# Patient Record
Sex: Female | Born: 1935 | Race: White | Hispanic: No | Smoking: Former smoker
Health system: Southern US, Community
[De-identification: ages and names within clinical notes are randomized; demographics above are authoritative.]

## PROBLEM LIST (undated history)

## (undated) DIAGNOSIS — H409 Unspecified glaucoma: Secondary | ICD-10-CM

## (undated) DIAGNOSIS — E785 Hyperlipidemia, unspecified: Secondary | ICD-10-CM

## (undated) DIAGNOSIS — E119 Type 2 diabetes mellitus without complications: Secondary | ICD-10-CM

## (undated) DIAGNOSIS — I10 Essential (primary) hypertension: Secondary | ICD-10-CM

## (undated) DIAGNOSIS — E039 Hypothyroidism, unspecified: Secondary | ICD-10-CM

## (undated) DIAGNOSIS — D649 Anemia, unspecified: Secondary | ICD-10-CM

## (undated) DIAGNOSIS — R739 Hyperglycemia, unspecified: Secondary | ICD-10-CM

## (undated) DIAGNOSIS — G629 Polyneuropathy, unspecified: Secondary | ICD-10-CM

## (undated) HISTORY — PX: BREAST SURGERY: SHX581

## (undated) HISTORY — DX: Hyperlipidemia, unspecified: E78.5

## (undated) HISTORY — DX: Polyneuropathy, unspecified: G62.9

## (undated) HISTORY — DX: Unspecified glaucoma: H40.9

## (undated) HISTORY — DX: Type 2 diabetes mellitus without complications: E11.9

## (undated) HISTORY — PX: CHOLECYSTECTOMY: SHX55

## (undated) HISTORY — PX: THYROIDECTOMY: SHX17

## (undated) HISTORY — DX: Anemia, unspecified: D64.9

---

## 1998-03-30 ENCOUNTER — Other Ambulatory Visit: Admission: RE | Admit: 1998-03-30 | Discharge: 1998-03-30 | Payer: Self-pay | Admitting: Gastroenterology

## 1999-08-02 ENCOUNTER — Other Ambulatory Visit: Admission: RE | Admit: 1999-08-02 | Discharge: 1999-08-02 | Payer: Self-pay | Admitting: Family Medicine

## 1999-11-08 ENCOUNTER — Encounter: Admission: RE | Admit: 1999-11-08 | Discharge: 1999-11-08 | Payer: Self-pay | Admitting: Surgery

## 1999-11-08 ENCOUNTER — Encounter: Payer: Self-pay | Admitting: Surgery

## 2000-08-10 ENCOUNTER — Other Ambulatory Visit: Admission: RE | Admit: 2000-08-10 | Discharge: 2000-08-10 | Payer: Self-pay | Admitting: Family Medicine

## 2000-10-05 ENCOUNTER — Encounter: Payer: Self-pay | Admitting: Family Medicine

## 2000-10-05 ENCOUNTER — Encounter: Admission: RE | Admit: 2000-10-05 | Discharge: 2000-10-05 | Payer: Self-pay | Admitting: Family Medicine

## 2000-10-08 ENCOUNTER — Encounter: Admission: RE | Admit: 2000-10-08 | Discharge: 2000-10-08 | Payer: Self-pay | Admitting: Family Medicine

## 2000-10-08 ENCOUNTER — Encounter: Payer: Self-pay | Admitting: Family Medicine

## 2000-11-10 ENCOUNTER — Encounter: Admission: RE | Admit: 2000-11-10 | Discharge: 2000-11-10 | Payer: Self-pay | Admitting: Surgery

## 2000-11-10 ENCOUNTER — Encounter: Payer: Self-pay | Admitting: Surgery

## 2001-08-19 ENCOUNTER — Other Ambulatory Visit: Admission: RE | Admit: 2001-08-19 | Discharge: 2001-08-19 | Payer: Self-pay | Admitting: Family Medicine

## 2001-08-25 ENCOUNTER — Encounter: Admission: RE | Admit: 2001-08-25 | Discharge: 2001-08-25 | Payer: Self-pay | Admitting: Family Medicine

## 2001-08-25 ENCOUNTER — Encounter: Payer: Self-pay | Admitting: Family Medicine

## 2001-11-15 ENCOUNTER — Encounter: Admission: RE | Admit: 2001-11-15 | Discharge: 2001-11-15 | Payer: Self-pay | Admitting: Family Medicine

## 2001-11-15 ENCOUNTER — Encounter: Payer: Self-pay | Admitting: Family Medicine

## 2001-11-22 ENCOUNTER — Encounter (INDEPENDENT_AMBULATORY_CARE_PROVIDER_SITE_OTHER): Payer: Self-pay | Admitting: Specialist

## 2001-11-22 ENCOUNTER — Ambulatory Visit (HOSPITAL_COMMUNITY): Admission: RE | Admit: 2001-11-22 | Discharge: 2001-11-22 | Payer: Self-pay | Admitting: Gastroenterology

## 2002-08-29 ENCOUNTER — Other Ambulatory Visit: Admission: RE | Admit: 2002-08-29 | Discharge: 2002-08-29 | Payer: Self-pay | Admitting: Family Medicine

## 2002-11-16 ENCOUNTER — Encounter: Admission: RE | Admit: 2002-11-16 | Discharge: 2002-11-16 | Payer: Self-pay | Admitting: Family Medicine

## 2002-11-16 ENCOUNTER — Encounter: Payer: Self-pay | Admitting: Family Medicine

## 2003-09-27 ENCOUNTER — Other Ambulatory Visit: Admission: RE | Admit: 2003-09-27 | Discharge: 2003-09-27 | Payer: Self-pay | Admitting: Family Medicine

## 2003-11-22 ENCOUNTER — Encounter: Admission: RE | Admit: 2003-11-22 | Discharge: 2003-11-22 | Payer: Self-pay | Admitting: Surgery

## 2004-12-04 ENCOUNTER — Encounter: Admission: RE | Admit: 2004-12-04 | Discharge: 2004-12-04 | Payer: Self-pay | Admitting: Surgery

## 2005-12-10 ENCOUNTER — Encounter: Admission: RE | Admit: 2005-12-10 | Discharge: 2005-12-10 | Payer: Self-pay | Admitting: Surgery

## 2006-05-01 ENCOUNTER — Encounter: Admission: RE | Admit: 2006-05-01 | Discharge: 2006-05-01 | Payer: Self-pay | Admitting: Internal Medicine

## 2006-05-15 ENCOUNTER — Inpatient Hospital Stay (HOSPITAL_COMMUNITY): Admission: EM | Admit: 2006-05-15 | Discharge: 2006-05-20 | Payer: Self-pay | Admitting: Emergency Medicine

## 2006-05-15 ENCOUNTER — Encounter (INDEPENDENT_AMBULATORY_CARE_PROVIDER_SITE_OTHER): Payer: Self-pay | Admitting: *Deleted

## 2006-05-18 ENCOUNTER — Ambulatory Visit: Payer: Self-pay | Admitting: Physical Medicine & Rehabilitation

## 2006-12-11 ENCOUNTER — Encounter: Admission: RE | Admit: 2006-12-11 | Discharge: 2006-12-11 | Payer: Self-pay | Admitting: Surgery

## 2007-12-14 ENCOUNTER — Encounter: Admission: RE | Admit: 2007-12-14 | Discharge: 2007-12-14 | Payer: Self-pay | Admitting: Internal Medicine

## 2008-12-14 ENCOUNTER — Encounter: Admission: RE | Admit: 2008-12-14 | Discharge: 2008-12-14 | Payer: Self-pay | Admitting: Surgery

## 2008-12-14 LAB — HM MAMMOGRAPHY: HM Mammogram: NORMAL

## 2011-05-02 NOTE — Op Note (Signed)
NAME:  Melissa Mccullough, Melissa Mccullough NO.:  [id number]   MEDICAL RECORD NO.:  [id number]          PATIENT TYPE:  INP   LOCATION:  2550                         FACILITY:  [facility]   PHYSICIAN:  Feliberto Gottron. Turner Daniels, M.D.   DATE OF BIRTH:  08-Sep-1928   DATE OF PROCEDURE:  05/15/2006  DATE OF DISCHARGE:                                 OPERATIVE REPORT   PREOPERATIVE DIAGNOSIS:  Left midshaft femur fracture.   POSTOPERATIVE DIAGNOSIS:  Left midshaft femur fracture.   PROCEDURE:  Open reduction internal fixation, left femur fracture with a  DePuy long trochanteric nail, crossed proximal locking screws and one distal  locking screw.   SURGEON:  Feliberto Gottron. Turner Daniels, MD   ASSISTANT:  Skip Mayer, PA-C.   The implant itself was actually 13 mm x 38 cm nail.   ESTIMATED BLOOD LOSS:  300 mL.   FLUID REPLACEMENT:  1L crystalloid.   DRAINS PLACED:  None.   TOURNIQUET TIME:  None.   INDICATIONS FOR PROCEDURE:  77 year old woman with severe osteoporosis who  had just finished up a course of IV Forteo because of her severe  osteoporosis.  Over the last week she has had increasing left thigh pain and  her left femur snapped on her today at the junction of the middle and distal  thirds.  She is taken to the [facility] emergency room.  Radiographs obtained and  orthopedic consultation arranged.  We reviewed the fracture.  She had  distant history of breast cancer.  No obvious lesions in the femur; however,  the plan was to biopsy the reamings just in case.  Risks, benefits of  surgery discussed and she is prepared for surgical stabilization of the  femur.   DESCRIPTION OF PROCEDURE:  The patient identified by armband, taken the  operating room at [facility].  Appropriate anesthetic monitors were  attached and general endotracheal anesthesia induced with the patient in  supine position.  She was then transferred to the flat [place] radiolucent  table, placed in a the right lateral decubitus  position and fixed there was  a [employer] II pelvic clamp and the left lower extremity prepped, draped  in usual sterile fashion from the ankle to the hemi pelvis.  Under C-arm  control we then found a point on the buttock, that was coaxial with the  trochanter and the shaft of the femur and a guide pin was placed through the  greater trochanter and down the center of the femur and then over reamed  with a proximal reamer for the DePuy trochanteric nail set.  We then placed  a beaded guide wire down to the distal femur just above the knee joint and  then sequentially reamed up to a 14.5 cannulated reamer obtaining a little  bit of chatter at the fracture site.  Her femur was quite large.  We  measured for a 38 cm nail which was then loaded up on the driver and was  driven through the greater trochanter down the shaft of the femur through  the fracture site and down to the distal aspect of  the femur about 1 cm or  1.5 cm above Blumensaat's line.  The spiral fracture itself was nicely  reduced near anatomic.  Satisfied with position of the rod and with the  driving platform in place we then placed a locking screw up through the  trochanter and into the femoral neck and another one down to the trochanter  into the lesser trochanter, obtaining good proximal locking of the proximal  third fracture.  Distally because of the anatomic reduction of the  noncomminuted fracture a single locking screw was used, placed under  freehand control again through about a 1 cm stab wound.  All wounds were  irrigated out with normal saline solution.  Subcutaneous closure  accomplished with running 2-0 Vicryl suture and the skin with skin staples.  Dressing of Xeroform, 4x4, dressing sponges, Hypafix tape applied.  The  patient was unclamped, rolled supine, awakened and taken to the recovery  room without difficulty.      Feliberto Gottron. Turner Daniels, M.D.  Electronically Signed     FJR/MEDQ  D:  05/15/2006  T:   05/18/2006  Job:  [id number]

## 2011-05-02 NOTE — Procedures (Signed)
. [facility]  Patient:    Melissa Mccullough, Melissa Mccullough Visit Number: [id number] MRN: [id number]          Service Type: END Location: ENDO Attending Physician:  Rich Brave Dictated by:   Florencia Reasons, M.D. Proc. Date: 11/22/01 Admit Date:  11/22/2001   CC:         Talmadge Coventry, M.D.   Procedure Report  PROCEDURE PERFORMED:  Upper endoscopy.  ENDOSCOPIST:  Florencia Reasons, M.D.  INDICATIONS FOR PROCEDURE:  The patient is a 77 year old female with transiently hemoccult positive stool who was maintained on a daily aspirin (adult strength).  FINDINGS:  Slight gastric erythema consistent with aspirin induced gastric irritation.  DESCRIPTION OF PROCEDURE:  The patient provided written consent for the procedure.  Sedation was fentanyl 40 mcg and Versed 4 mg prior to and during this procedure and the colonoscopy which followed it.  The Olympus video endoscope was passed under direct vision without difficulty.  The esophagus was endoscopically normal.  No reflux esophagitis, Barretts esophagus, varices, infection or neoplasia were appreciated and there was no evidence of any ring stricture or hiatal hernia.  The stomach contained no significant residual.  There was patchy antral erythema and some focal mucosal hemorrhages in the antral region consistent with aspirin induced gastropathy, but no lesions such as erosions, ulcers, polyps or masses were observed including retroflex view of the proximal stomach.  The pylorus duodenal bulb and second duodenum were normal.  The scope was removed from the patient  who tolerated the procedure well and there were no apparent complications.  No biopsies were obtained.  IMPRESSION:  Mild aspirin induced gastritis which might account for the patients transient hemoccult positivity.  PLAN:  Based on the endoscopic findings, I do not see any indication for stopping her aspirin.  Proceed to  colonoscopic evaluation.  I do not see where there is any mandatory need for ongoing antipeptic therapy, either.  Note that the patient is on Vioxx as of the present time, which might be contributing to the endoscopic picture. Dictated by:   Florencia Reasons, M.D. Attending Physician:  Rich Brave DD:  11/22/01 TD:  11/22/01 Job: [id number]

## 2011-05-02 NOTE — Discharge Summary (Signed)
Melissa Mccullough, BIGGS NO.:  [id number]   MEDICAL RECORD NO.:  [id number]          PATIENT TYPE:  INP   LOCATION:  5025                         FACILITY:  [facility]   PHYSICIAN:  Feliberto Gottron. Turner Daniels, M.D.   DATE OF BIRTH:  September 27, 1928   DATE OF ADMISSION:  05/15/2006  DATE OF DISCHARGE:  05/20/2006                                 DISCHARGE SUMMARY   PRIMARY DISCHARGE DIAGNOSIS:  Left femoral fracture.   PROCEDURE IN HOSPITAL:  ORIF of left femoral fracture with an IM rod,  reconstructive type.   HISTORY:  The patient is a 77 year old woman with severe osteoporosis, who  had just finished up a course of IV Fosamax 40 mg, because of severe  osteoporosis of the left leg, with which she has had increasing left thigh  pain.  Her low femur snapped on the day of admission at the junction of the  middle and distal thirds, and she fell because of that.  She has been taken  to the [facility] Emergency Department.  Radiographs were  obtained and an orthopedic consultation arranged with Dr. Feliberto Gottron. Turner Daniels.  A  distant history of breast cancer.  No obvious lesions of the femur.  The  risks and benefits of the surgery were discussed, and the patient wished to  proceed.   PAST MEDICAL HISTORY:  1.  Usual childhood disease.  2.  Diabetes.  3.  Thyroid disease.  4.  Breast cancer.  5.  Hypertension.  6.  Osteoporosis.  7.  Increased cholesterol.  8.  Glaucoma.   PAST SURGICAL HISTORY:  1.  Thyroid removal.  2.  Breast cancer resection.   FAMILY HISTORY:  Mother died at age 61, secondary to cancer.  Father died at  age 12, secondary to cancer.   SOCIAL HISTORY:  No tobacco, ethanol or drug abuse.  She is a Neurosurgeon.  She is widowed.   ALLERGIES:  CODEINE which causes a rash.   MEDICATIONS ON ADMISSION:  1.  Fosamax 40 mg.  2.  Xalatan.  3.  Synthroid.  4.  Actos.  5.  Glipizide.  6.  Norvasc.  7.  Lisinopril.  8.  Hydrochlorothiazide.   REVIEW OF  SYSTEMS:  Positive for upper and lower dentures.  Glasses.  No  recent illness, shortness of breath or chest pain.   PHYSICAL EXAMINATION:  VITAL SIGNS:  Afebrile, pulse 92, respirations 18,  blood pressure 142/76.  She is a 5-foot, 150-pound female.  NECK:  Supple with a full range of motion.  CHEST:  Clear to auscultation and percussion.  HEENT:  Head normocephalic and atraumatic.  Eyes:  Pupils equal, round,  reactive to light and accommodation.  Ears:  Tympanic membranes clear.  HEART:  A regular rate and rhythm.  ABDOMEN:  Soft, nontender.  EXTREMITIES:  Left leg in a splint.  NEUROLOGIC:  Intact to light touch and pulses with the exception of  decreased light touch on the plantar surfaces of both feet which is  secondary to some problems from her diabetes.   PREOPERATIVE LABORATORY DATA:  Including a  CBC, CMET, chest x-ray,  electrocardiogram and PTT were within normal limits.   HOSPITAL COURSE:  The patient was taken to the [facility]  Operating Room where she underwent an open reduction, internal fixation of  her left femoral fracture using a DePuy long trochanteric nail with crossed  proximal locking screws and one distal locking screw.  The patient was  placed on peri-operative antibiotics. She was placed on postoperative  Coumadin prophylaxis with target level of 1.5 to 2.  She was placed on  postoperative pain control using a PCA.  She was also placed on  postoperative glycemic control per the hospital protocol.   On postoperative day one the patient was resting fairly comfortably.  She  had a moderate amount of pain.  She was awake, alert and otherwise taking  p.o. well.  She was afebrile.  She was neurovascularly intact to motor.  Light touch was at her baseline.  PT 14.2.  Hemoglobin 10.1, otherwise  stable.  She began physical therapy with partial weightbearing with physical  therapy's help.  Of note, she was also bridged with Lovenox until she  became  therapeutic on the Coumadin.   On postoperative day two the patient was without complaints, afebrile.  Hemoglobin 8.6.  INR 1.1.  The thigh remained soft.  Physical therapy  suggested getting a rehab consultation.  This was obtained.  The PCA was  discontinued.   On postoperative day three the patient was without complaints, afebrile.  INR 1.3.  Dressings were dry.  The wound was benign.  A rehab consultation  had been obtained and they had been suggesting inpatient rehab.   On postoperative day four the patient was without complaints.  T-Max 100.5  degrees, ranging to 97.8 degrees.  INR 1.7.  The wound was benign.  The  staples were intact.  She continued with physical therapy.  Unfortunately  she did not seem to be making a particularly quick progress, but was  progressing steadily; however, a bed was not available on rehab.  Her  insurance denied her rehab stay anyway.   On postoperative day five the patient was without complaints.  T-Max was  98.8 degrees.  INR 1.9.  The dressing was dry.  The wound was benign.  The  thigh was soft and nontender.  She was medically stable and improved  compared to prior to her admission.  She was orthopedically improved as  well.  An examination of the patient, she was progressing well with physical  therapy, partial weightbearing to the left lower extremity.  Dressing  changes daily.  She may shower on postoperative day seven.   DISCHARGE MEDICATIONS:  1.  Ferrous sulfate 325 mg t.i.d.  2.  Synthroid 50 mcg daily.  3.  Actos 30 mg daily with meals.  4.  Lisinopril 10 mg p.o. daily.  5.  Glucotrol 5 mg p.o. daily a.c.  6.  Norvasc 5 mg p.o. daily.  7.  Zetia 10 mg p.o. daily.  8.  Hydrochlorothiazide 25 mg p.o. daily.  9.  Xalatan ophthalmic solution, one drop in each eye at night time.  10. Coumadin per the pharmacy protocol with a target INR of 1.5 to 2.  11. Senokot p.r.n. constipation. 12. Tylox, one to two q.4h. p.r.n. pain.   13. Phenergan 25 mg p.o., IV or suppository, the best available route p.r.n.      nausea.  14. Robaxin 500 mg p.o. q.6h. p.r.n. spasm.  15. Xanax 0.25 mg p.o. p.r.n.  agitation or anxiety.   ACTIVITY:  As mentioned, partial weightbearing with physical therapy's help  and a walker.   FOLLOWUP:  She should return to the clinic to see Dr. Turner Daniels in approximately  one week's time.  Please call [phone] to schedule that appointment.  She  should return sooner should she have any increased temperature over 101  degrees and any increase in pain or drainage from the wound that looks like  infection.  She will otherwise return to see [place] as scheduled.      Laural Benes. Jannet Mantis.      Feliberto Gottron. Turner Daniels, M.D.  Electronically Signed    JBR/MEDQ  D:  05/20/2006  T:  05/20/2006  Job:  [id number]

## 2011-05-02 NOTE — Procedures (Signed)
[place]  Patient:    Melissa Mccullough, Melissa Mccullough Visit Number: [id number] MRN: [id number]          Service Type: END Location: ENDO Attending Physician:  Rich Brave Dictated by:   Florencia Reasons, M.D. Proc. Date: 11/22/01 Admit Date:  11/22/2001   CC:         Talmadge Coventry, M.D.   Procedure Report  PROCEDURE:  Colonoscopy with biopsy.  INDICATION:  A 77 year old female with remote history of adenomatous polyp having been removed and her most recent surveillance colonoscopy, four years ago, having been negative for neoplasia but she was found to be transiently Hemoccult-positive on Hemoccults through her primary physicians office recently.  FINDINGS:  Diminutive sessile polyp, removed by cold biopsy technique.  DESCRIPTION OF PROCEDURE:  The nature, purpose, and risks of the procedure were familiar to the patient from prior examination, and she provided written consent.  This procedure was performed immediately following her upper endoscopy.  Total sedation for the two procedures was fentanyl 40 mcg and Versed 4 mg IV without arrhythmias or desaturation.  The Olympus standard adult video colonoscope was advanced to the terminal ileum without too much difficulty, applying just a little bit of external abdominal compression to facilitate passage.  Pullback was then performed.  The terminal ileum was normal.  In the colon, there was a 2-3 mm sessile polyp in the midtransverse colon region at about 100 cm of insertion during advancement of the scope.  This was removed by two cold biopsies.  No other polyps were seen, and there was no evidence of cancer, colitis, vascular malformations, or diverticular disease.  The patient tolerated the procedure well, and there were no apparent complications.  Retroflexion in the rectum was unremarkable.  IMPRESSION:  Diminutive colonic polyp, otherwise normal exam.  PLAN:  Await pathology  on the polyp.  Follow-up colonoscopy anticipated in five years in view of the prior history of colonic adenomas, presumably if the patient remains in good general health in the interim. Dictated by:   Florencia Reasons, M.D. Attending Physician:  Rich Brave DD:  11/22/01 TD:  11/22/01 Job: [id number]

## 2012-05-19 ENCOUNTER — Emergency Department (HOSPITAL_COMMUNITY): Payer: Medicare Other

## 2012-05-19 ENCOUNTER — Inpatient Hospital Stay (HOSPITAL_COMMUNITY)
Admission: EM | Admit: 2012-05-19 | Discharge: 2012-05-22 | DRG: 388 | Disposition: A | Discharge status: Home Health | Payer: Medicare Other | Attending: Internal Medicine | Admitting: Internal Medicine

## 2012-05-19 ENCOUNTER — Encounter (HOSPITAL_COMMUNITY): Payer: Self-pay

## 2012-05-19 DIAGNOSIS — Z7982 Long term (current) use of aspirin: Secondary | ICD-10-CM

## 2012-05-19 DIAGNOSIS — Z79899 Other long term (current) drug therapy: Secondary | ICD-10-CM

## 2012-05-19 DIAGNOSIS — E1165 Type 2 diabetes mellitus with hyperglycemia: Secondary | ICD-10-CM

## 2012-05-19 DIAGNOSIS — E119 Type 2 diabetes mellitus without complications: Secondary | ICD-10-CM | POA: Diagnosis present

## 2012-05-19 DIAGNOSIS — Z87891 Personal history of nicotine dependence: Secondary | ICD-10-CM

## 2012-05-19 DIAGNOSIS — D72829 Elevated white blood cell count, unspecified: Secondary | ICD-10-CM | POA: Diagnosis present

## 2012-05-19 DIAGNOSIS — R109 Unspecified abdominal pain: Secondary | ICD-10-CM

## 2012-05-19 DIAGNOSIS — R6510 Systemic inflammatory response syndrome (SIRS) of non-infectious origin without acute organ dysfunction: Secondary | ICD-10-CM

## 2012-05-19 DIAGNOSIS — J189 Pneumonia, unspecified organism: Secondary | ICD-10-CM | POA: Diagnosis present

## 2012-05-19 DIAGNOSIS — R1084 Generalized abdominal pain: Secondary | ICD-10-CM

## 2012-05-19 DIAGNOSIS — E876 Hypokalemia: Secondary | ICD-10-CM | POA: Diagnosis present

## 2012-05-19 DIAGNOSIS — T495X5A Adverse effect of ophthalmological drugs and preparations, initial encounter: Secondary | ICD-10-CM | POA: Diagnosis not present

## 2012-05-19 DIAGNOSIS — K560 Paralytic ileus: Principal | ICD-10-CM | POA: Diagnosis present

## 2012-05-19 DIAGNOSIS — E039 Hypothyroidism, unspecified: Secondary | ICD-10-CM | POA: Diagnosis present

## 2012-05-19 DIAGNOSIS — N390 Urinary tract infection, site not specified: Secondary | ICD-10-CM | POA: Diagnosis present

## 2012-05-19 DIAGNOSIS — H5789 Other specified disorders of eye and adnexa: Secondary | ICD-10-CM | POA: Diagnosis not present

## 2012-05-19 DIAGNOSIS — I10 Essential (primary) hypertension: Secondary | ICD-10-CM | POA: Diagnosis present

## 2012-05-19 DIAGNOSIS — H3530 Unspecified macular degeneration: Secondary | ICD-10-CM | POA: Diagnosis present

## 2012-05-19 DIAGNOSIS — H409 Unspecified glaucoma: Secondary | ICD-10-CM | POA: Diagnosis present

## 2012-05-19 DIAGNOSIS — E785 Hyperlipidemia, unspecified: Secondary | ICD-10-CM | POA: Diagnosis present

## 2012-05-19 DIAGNOSIS — D696 Thrombocytopenia, unspecified: Secondary | ICD-10-CM

## 2012-05-19 DIAGNOSIS — Z853 Personal history of malignant neoplasm of breast: Secondary | ICD-10-CM

## 2012-05-19 DIAGNOSIS — A498 Other bacterial infections of unspecified site: Secondary | ICD-10-CM | POA: Diagnosis present

## 2012-05-19 DIAGNOSIS — Z66 Do not resuscitate: Secondary | ICD-10-CM | POA: Diagnosis present

## 2012-05-19 DIAGNOSIS — B309 Viral conjunctivitis, unspecified: Secondary | ICD-10-CM | POA: Diagnosis not present

## 2012-05-19 HISTORY — DX: Essential (primary) hypertension: I10

## 2012-05-19 HISTORY — DX: Hypothyroidism, unspecified: E03.9

## 2012-05-19 LAB — CBC
Hemoglobin: 12.1 g/dL (ref 12.0–15.0)
MCH: 25.6 pg — ABNORMAL LOW (ref 26.0–34.0)
MCHC: 33 g/dL (ref 30.0–36.0)
MCV: 77.8 fL — ABNORMAL LOW (ref 78.0–100.0)
Platelets: 295 10*3/uL (ref 150–400)
Platelets: 237 10*3/uL (ref 150–400)
RBC: 4.72 MIL/uL (ref 3.87–5.11)
RBC: 4.24 MIL/uL (ref 3.87–5.11)
RDW: 16.3 % — ABNORMAL HIGH (ref 11.5–15.5)
RDW: 16.4 % — ABNORMAL HIGH (ref 11.5–15.5)
WBC: 16.7 10*3/uL — ABNORMAL HIGH (ref 4.0–10.5)
WBC: 15.6 10*3/uL — ABNORMAL HIGH (ref 4.0–10.5)

## 2012-05-19 LAB — DIFFERENTIAL
Basophils Absolute: 0 10*3/uL (ref 0.0–0.1)
Basophils Relative: 0 % (ref 0–1)
Eosinophils Absolute: 0 10*3/uL (ref 0.0–0.7)
Eosinophils Relative: 0 % (ref 0–5)
Lymphocytes Relative: 8 % — ABNORMAL LOW (ref 12–46)
Monocytes Absolute: 1.3 10*3/uL — ABNORMAL HIGH (ref 0.1–1.0)
Monocytes Relative: 8 % (ref 3–12)

## 2012-05-19 LAB — PROCALCITONIN: Procalcitonin: 0.93 ng/mL

## 2012-05-19 LAB — COMPREHENSIVE METABOLIC PANEL
Albumin: 3.4 g/dL — ABNORMAL LOW (ref 3.5–5.2)
Alkaline Phosphatase: 68 U/L (ref 39–117)
BUN: 41 mg/dL — ABNORMAL HIGH (ref 6–23)
CO2: 21 mEq/L (ref 19–32)
Chloride: 91 mEq/L — ABNORMAL LOW (ref 96–112)
GFR calc non Af Amer: 55 mL/min — ABNORMAL LOW (ref >90)
Glucose, Bld: 340 mg/dL — ABNORMAL HIGH (ref 70–99)
Potassium: 3.2 mEq/L — ABNORMAL LOW (ref 3.5–5.1)
Total Bilirubin: 0.7 mg/dL (ref 0.3–1.2)

## 2012-05-19 LAB — CREATININE, SERUM
Creatinine, Ser: 0.81 mg/dL (ref 0.50–1.10)
GFR calc Af Amer: 77 mL/min — ABNORMAL LOW (ref >90)
GFR calc non Af Amer: 66 mL/min — ABNORMAL LOW (ref >90)

## 2012-05-19 LAB — GLUCOSE, CAPILLARY: Glucose-Capillary: 229 mg/dL — ABNORMAL HIGH (ref 70–99)

## 2012-05-19 LAB — URINALYSIS, ROUTINE W REFLEX MICROSCOPIC
Bilirubin Urine: NEGATIVE
Glucose, UA: 100 mg/dL — AB
Ketones, ur: NEGATIVE mg/dL
Nitrite: POSITIVE — AB
pH: 5.5 (ref 5.0–8.0)

## 2012-05-19 LAB — URINE MICROSCOPIC-ADD ON

## 2012-05-19 LAB — LIPASE, BLOOD: Lipase: 32 U/L (ref 11–59)

## 2012-05-19 MED ORDER — LEVOFLOXACIN IN D5W 750 MG/150ML IV SOLN
750.0000 mg | INTRAVENOUS | Status: DC
  Administered 2012-05-19 – 2012-05-21 (×3): 750 mg via INTRAVENOUS
  Filled 2012-05-19 (×4): qty 150

## 2012-05-19 MED ORDER — BRIMONIDINE TARTRATE 0.1 % OP SOLN: 1.0000 [drp] | Freq: Two times a day (BID) | OPHTHALMIC | Status: DC

## 2012-05-19 MED ORDER — CYCLOSPORINE 0.05 % OP EMUL
1.0000 [drp] | Freq: Two times a day (BID) | OPHTHALMIC | Status: DC
  Administered 2012-05-19: 1 [drp] via OPHTHALMIC
  Filled 2012-05-19 (×3): qty 1

## 2012-05-19 MED ORDER — SODIUM CHLORIDE 0.9 % IV BOLUS (SEPSIS)
500.0000 mL | Freq: Once | INTRAVENOUS | Status: AC
  Administered 2012-05-19: 500 mL via INTRAVENOUS

## 2012-05-19 MED ORDER — ONDANSETRON HCL 4 MG PO TABS: 4.0000 mg | ORAL_TABLET | Freq: Four times a day (QID) | ORAL | Status: DC | PRN

## 2012-05-19 MED ORDER — IOHEXOL 300 MG/ML  SOLN
100.0000 mL | Freq: Once | INTRAMUSCULAR | Status: AC | PRN
  Administered 2012-05-19: 100 mL via INTRAVENOUS

## 2012-05-19 MED ORDER — BIMATOPROST 0.01 % OP SOLN
1.0000 [drp] | Freq: Every day | OPHTHALMIC | Status: DC
  Administered 2012-05-19: 1 [drp] via OPHTHALMIC
  Filled 2012-05-19: qty 2.5

## 2012-05-19 MED ORDER — BRIMONIDINE TARTRATE 0.15 % OP SOLN
1.0000 [drp] | Freq: Two times a day (BID) | OPHTHALMIC | Status: DC
  Filled 2012-05-19: qty 5

## 2012-05-19 MED ORDER — MORPHINE SULFATE 2 MG/ML IJ SOLN
2.0000 mg | Freq: Once | INTRAMUSCULAR | Status: DC
  Filled 2012-05-19: qty 1

## 2012-05-19 MED ORDER — TRAMADOL HCL 50 MG PO TABS
50.0000 mg | ORAL_TABLET | Freq: Four times a day (QID) | ORAL | Status: DC | PRN
  Administered 2012-05-19 – 2012-05-20 (×2): 50 mg via ORAL
  Filled 2012-05-19 (×2): qty 1

## 2012-05-19 MED ORDER — PANTOPRAZOLE SODIUM 40 MG PO TBEC
40.0000 mg | DELAYED_RELEASE_TABLET | Freq: Every day | ORAL | Status: DC
  Administered 2012-05-20 – 2012-05-22 (×3): 40 mg via ORAL
  Filled 2012-05-19 (×3): qty 1

## 2012-05-19 MED ORDER — ONDANSETRON HCL 4 MG/2ML IJ SOLN
4.0000 mg | Freq: Once | INTRAMUSCULAR | Status: DC
  Filled 2012-05-19: qty 2

## 2012-05-19 MED ORDER — GLIPIZIDE 5 MG PO TABS
7.5000 mg | ORAL_TABLET | Freq: Every day | ORAL | Status: DC
  Administered 2012-05-20: 7.5 mg via ORAL
  Filled 2012-05-19: qty 1

## 2012-05-19 MED ORDER — LEVOTHYROXINE SODIUM 50 MCG PO TABS
50.0000 ug | ORAL_TABLET | Freq: Every day | ORAL | Status: DC
  Administered 2012-05-20 – 2012-05-22 (×3): 50 ug via ORAL
  Filled 2012-05-19 (×3): qty 1

## 2012-05-19 MED ORDER — BRINZOLAMIDE 1 % OP SUSP
1.0000 [drp] | Freq: Two times a day (BID) | OPHTHALMIC | Status: DC
  Administered 2012-05-19 – 2012-05-20 (×2): 1 [drp] via OPHTHALMIC
  Filled 2012-05-19: qty 10

## 2012-05-19 MED ORDER — ASPIRIN EC 81 MG PO TBEC
81.0000 mg | DELAYED_RELEASE_TABLET | Freq: Every day | ORAL | Status: DC
  Administered 2012-05-20 – 2012-05-22 (×3): 81 mg via ORAL
  Filled 2012-05-19 (×3): qty 1

## 2012-05-19 MED ORDER — IOHEXOL 300 MG/ML  SOLN
20.0000 mL | INTRAMUSCULAR | Status: AC
  Administered 2012-05-19 (×2): 20 mL via ORAL

## 2012-05-19 MED ORDER — SODIUM CHLORIDE 0.9 % IV SOLN: INTRAVENOUS | Status: DC

## 2012-05-19 MED ORDER — ALPRAZOLAM 0.5 MG PO TABS
0.5000 mg | ORAL_TABLET | Freq: Every day | ORAL | Status: DC
  Administered 2012-05-20 – 2012-05-22 (×3): 0.5 mg via ORAL
  Filled 2012-05-19 (×3): qty 1

## 2012-05-19 MED ORDER — BRIMONIDINE TARTRATE 0.2 % OP SOLN
1.0000 [drp] | Freq: Two times a day (BID) | OPHTHALMIC | Status: DC
  Administered 2012-05-19: 1 [drp] via OPHTHALMIC
  Filled 2012-05-19: qty 5

## 2012-05-19 MED ORDER — HYDROMORPHONE HCL PF 1 MG/ML IJ SOLN: 1.0000 mg | INTRAMUSCULAR | Status: DC | PRN

## 2012-05-19 MED ORDER — ENOXAPARIN SODIUM 40 MG/0.4ML ~~LOC~~ SOLN
40.0000 mg | SUBCUTANEOUS | Status: DC
  Administered 2012-05-19 – 2012-05-22 (×4): 40 mg via SUBCUTANEOUS
  Filled 2012-05-19 (×4): qty 0.4

## 2012-05-19 MED ORDER — ADULT MULTIVITAMIN W/MINERALS CH
1.0000 | ORAL_TABLET | Freq: Every day | ORAL | Status: DC
  Administered 2012-05-20 – 2012-05-22 (×3): 1 via ORAL
  Filled 2012-05-19 (×3): qty 1

## 2012-05-19 MED ORDER — EZETIMIBE 10 MG PO TABS
10.0000 mg | ORAL_TABLET | Freq: Every day | ORAL | Status: DC
  Administered 2012-05-20 – 2012-05-22 (×4): 10 mg via ORAL
  Filled 2012-05-19 (×3): qty 1

## 2012-05-19 MED ORDER — ONDANSETRON HCL 4 MG/2ML IJ SOLN: 4.0000 mg | Freq: Four times a day (QID) | INTRAMUSCULAR | Status: DC | PRN

## 2012-05-19 MED ORDER — INSULIN ASPART 100 UNIT/ML ~~LOC~~ SOLN
0.0000 [IU] | Freq: Three times a day (TID) | SUBCUTANEOUS | Status: DC
  Administered 2012-05-20 – 2012-05-21 (×3): 3 [IU] via SUBCUTANEOUS
  Administered 2012-05-21 (×2): 5 [IU] via SUBCUTANEOUS
  Administered 2012-05-22: 3 [IU] via SUBCUTANEOUS
  Administered 2012-05-22: 5 [IU] via SUBCUTANEOUS

## 2012-05-19 MED ORDER — ENOXAPARIN SODIUM 30 MG/0.3ML ~~LOC~~ SOLN: 30.0000 mg | SUBCUTANEOUS | Status: DC

## 2012-05-19 NOTE — ED Provider Notes (Addendum)
History     CSN: [id number]  Arrival date & time 05/19/12  1013   First MD Initiated Contact with Patient 05/19/12 1105      Chief Complaint  Patient presents with  . Abdominal Pain    (Consider location/radiation/quality/duration/timing/severity/associated sxs/prior treatment) HPI  83yoF h/o NIDDM, HTN, pw abdominal pain. C/O 3 days of diffuse upper abdominal pain > right. +N/V and multiple episodes of NBNB emesis, last yesterday evening before bed. Felt "hot last night" but denies fever, chills. Denies constipation. +Watery diarrhea this am "so many times I can't count". Family states that she appears more SOB than usual. Denies cp/palpitations. Denies back pain. Denies hematuria/dysuria/freq/urgency. Has been coughing more than usual. No sick contacts. H/o abdominal surgeries includes cholecystectomy   ED Notes, ED Provider Notes from 05/19/12 0000 to 05/19/12 10:57:58       Kathe Becton, RN 05/19/2012 10:40      ABdominal pain began on MOnday whe reports having upper abdominal pain all day and began with vomiting Monday night She reports yesterday feeling fine and then last night started with n/v again and this am Diarrhea. She also has a hoarse voice. She denies any abdominal pain at present time     No past medical history on file.  No past surgical history on file.  No family history on file.  History  Substance Use Topics  . Smoking status: Former Games developer  . Smokeless tobacco: Not on file  . Alcohol Use: No    OB History    Grav Para Term Preterm Abortions TAB SAB Ect Mult Living                  Review of Systems  All other systems reviewed and are negative.  except as noted HPI   Allergies  Codeine  Home Medications   Current Outpatient Rx  Name Route Sig Dispense Refill  . ALPRAZOLAM 0.5 MG PO TABS Oral Take 0.5 mg by mouth daily.    Marland Kitchen AMLODIPINE BESYLATE 5 MG PO TABS Oral Take 5 mg by mouth daily.    . ASPIRIN EC 81 MG PO TBEC Oral Take 81 mg by  mouth daily.    Marland Kitchen BIMATOPROST 0.01 % OP SOLN Both Eyes Place 1 drop into both eyes at bedtime.    Marland Kitchen BRIMONIDINE TARTRATE 0.1 % OP SOLN Right Eye Place 1 drop into the right eye 2 (two) times daily.    Marland Kitchen BRINZOLAMIDE 1 % OP SUSP Right Eye Place 1 drop into the right eye 2 (two) times daily.    Marland Kitchen CALCIUM-MAGNESIUM-ZINC PO Oral Take 1 tablet by mouth daily.    Marland Kitchen PERFECT IRON PO Oral Take 1 tablet by mouth daily.    . CYCLOSPORINE 0.05 % OP EMUL Both Eyes Place 1 drop into both eyes 2 (two) times daily.    Marland Kitchen EZETIMIBE 10 MG PO TABS Oral Take 10 mg by mouth daily.    Marland Kitchen GLIPIZIDE 5 MG PO TABS Oral Take 7.5 mg by mouth daily.    Marland Kitchen HYDROCHLOROTHIAZIDE 25 MG PO TABS Oral Take 25 mg by mouth daily.    Marland Kitchen LEVOTHYROXINE SODIUM 50 MCG PO TABS Oral Take 50 mcg by mouth daily.    Marland Kitchen LISINOPRIL 10 MG PO TABS Oral Take 10 mg by mouth daily.    Marland Kitchen MECLIZINE HCL 25 MG PO TABS Oral Take 25 mg by mouth every 4 (four) hours as needed. For vertigo    . METFORMIN HCL ER (MOD) 500  MG PO TB24 Oral Take 500-1,000 mg by mouth daily with breakfast. 1 tablet in the morning  2 tablets at night    . ADULT MULTIVITAMIN W/MINERALS CH Oral Take 1 tablet by mouth daily.    Marland Kitchen NITROFURANTOIN MACROCRYSTAL 100 MG PO CAPS Oral Take 100 mg by mouth 2 (two) times daily.    . OMEGA-3-ACID ETHYL ESTERS 1 G PO CAPS Oral Take 1 g by mouth daily.    . TRAMADOL HCL 50 MG PO TABS Oral Take 50 mg by mouth 4 (four) times daily as needed. For pain    . VITAMIN E 400 UNITS PO CAPS Oral Take 400 Units by mouth daily.      BP 112/53  Pulse 113  Temp(Src) 97.6 F (36.4 C) (Oral)  Resp 23  SpO2 98%  Physical Exam  Nursing note and vitals reviewed. Constitutional: She is oriented to person, place, and time. She appears well-developed.  HENT:  Head: Atraumatic.  Mouth/Throat: Oropharynx is clear and moist.  Eyes: Conjunctivae and EOM are normal. Pupils are equal, round, and reactive to light.  Neck: Normal range of motion. Neck supple.    Cardiovascular: Regular rhythm, normal heart sounds and intact distal pulses.        tachycardia  Pulmonary/Chest: Effort normal and breath sounds normal. No respiratory distress. She has no wheezes. She has no rales.       Crackles, bibasilar L>R  Abdominal: Soft. She exhibits no distension. There is tenderness. There is no rebound and no guarding.       Diffuse abd ttp No r/g  Musculoskeletal: Normal range of motion.  Neurological: She is alert and oriented to person, place, and time.  Skin: Skin is warm and dry. No rash noted.  Psychiatric: She has a normal mood and affect.    Date: 05/19/2012  Rate: 108  Rhythm: sinus tachycardia  QRS Axis: normal  Intervals: normal  ST/T Wave abnormalities: nonspecific T wave changes  Conduction Disutrbances:none  Narrative Interpretation:   Old EKG Reviewed: no significant change from previous  ED Course  Procedures (including critical care time)  Labs Reviewed  CBC - Abnormal; Notable for the following:    WBC 16.7 (*)    MCV 77.8 (*)    MCH 25.6 (*)    RDW 16.3 (*)    All other components within normal limits  DIFFERENTIAL - Abnormal; Notable for the following:    Neutrophils Relative 85 (*)    Neutro Abs 14.1 (*)    Lymphocytes Relative 8 (*)    Monocytes Absolute 1.3 (*)    All other components within normal limits  COMPREHENSIVE METABOLIC PANEL - Abnormal; Notable for the following:    Sodium 127 (*)    Potassium 3.2 (*)    Chloride 91 (*)    Glucose, Bld 340 (*)    BUN 41 (*)    Albumin 3.4 (*)    AST 47 (*)    ALT 52 (*)    GFR calc non Af Amer 55 (*)    GFR calc Af Amer 64 (*)    All other components within normal limits  URINALYSIS, ROUTINE W REFLEX MICROSCOPIC - Abnormal; Notable for the following:    APPearance CLOUDY (*)    Glucose, UA 100 (*)    Nitrite POSITIVE (*)    Leukocytes, UA MODERATE (*)    All other components within normal limits  URINE MICROSCOPIC-ADD ON - Abnormal; Notable for the following:     Bacteria, UA  MANY (*)    Casts HYALINE CASTS (*) GRANULAR CAST   All other components within normal limits  LIPASE, BLOOD  TROPONIN I  CULTURE, BLOOD (ROUTINE X 2)  CULTURE, BLOOD (ROUTINE X 2)  URINE CULTURE  LACTIC ACID, PLASMA  PROCALCITONIN   Dg Chest 2 View  05/19/2012  *RADIOLOGY REPORT*  Clinical Data: Abdominal pain with weakness and diarrhea.  History of hypertension and diabetes.  CHEST - 2 VIEW  Comparison: 05/15/2006 radiographs.  Findings: The heart size and mediastinal contours are normal. The lungs are clear. There is no pleural effusion or pneumothorax. No acute osseous findings are identified. There are stable postsurgical changes in the left paratracheal region.  IMPRESSION: Stable examination.  No active cardiopulmonary process.  Original Report Authenticated By: Gerrianne Scale, M.D.   Ct Abdomen Pelvis W Contrast  05/19/2012  *RADIOLOGY REPORT*  Clinical Data: Abdominal pain over past few days with prominent which has subsided.  Diarrhea.  CT ABDOMEN AND PELVIS WITH CONTRAST  Technique:  Multidetector CT imaging of the abdomen and pelvis was performed following the standard protocol during bolus administration of intravenous contrast.  Contrast:  100 ml Omnipaque-300.  Comparison: None.  Findings: Dilated small bowel loops which are located anterior to the transverse colon (which may be related to ligamentum laxity rather than an internal hernia).  Small bowel loops are not dilated to the lower left abdomen where there is an abrupt change in caliber.  This may be related to adhesion.  Discrete mass at this level not identified (series 2 image 43).  No free fluid or free intraperitoneal air.  Fatty containing periumbilical hernia.  No bowel containing worrisome wall hernia.  Bilateral lower lobe infiltrates greater on the left.  Coronary artery calcifications.  Mitral valve and aortic valve calcifications.  Mild cardiomegaly.  Atherosclerotic type changes of the abdominal aorta  with ectasia. Atherosclerotic type changes extends into the aortic branch vessels and iliac arteries.  Prior left hip surgery with streak artifact. Right hip joint degenerative changes.  Degenerative changes most notable L5-S1.  Noncontrast filled views of the urinary bladder unremarkable.  Post cholecystectomy.  Mild prominence intrahepatic biliary ducts felt to be related to the post cholecystectomy state.  Mildly lobulated contour of the liver without other findings to suggest cirrhosis. Probable small hepatic cyst.  No focal splenic, pancreatic or adrenal lesion.  Bilateral adrenal gland hyperplasia.  IMPRESSION: Small bowel loops are not dilated to the lower left abdomen where there is an abrupt change in caliber.  This may be related to adhesions.  Discrete mass at this level not identified (series 2 image 43).  No free fluid or free intraperitoneal air.  Bilateral lower lobe infiltrates greater on the left.  Atherosclerotic type changes as detailed above.  Adrenal gland hyperplasia with nodularity.  Slightly lobulated contour of the liver without other findings of cirrhosis.  Please see above.  Original Report Authenticated By: Fuller Canada, M.D.    1. CAP (community acquired pneumonia)   2. UTI (lower urinary tract infection)   3. SIRS (systemic inflammatory response syndrome)     MDM  Cough, upper abdominal pain/nv. +Leukocytosis. Bibasilar pna by CT A/P which is otherwise unremarkable. Given hypoxia and +SIRS will admit for IV abx. IVF, IV levaquin--will also cover possible UTI. Will add on legionella given diarrhea. Discussed admission with [facility] hospital[facility] who will admit to med/surg.        Forbes Cellar, MD 05/19/12 1545  Forbes Cellar, MD 05/19/12 [phone]

## 2012-05-19 NOTE — ED Notes (Signed)
Still awaiting levaquin from pharmacy, pt resting, awaiting admission

## 2012-05-19 NOTE — H&P (Addendum)
PCP:   Nadean Corwin, MD, MD   Chief Complaint:  Abdominal pain  HPI: [name] is a very pleasant 78 year old white female with history of diabetes, hypertension, hypothyroidism was in her usual state of health until Monday. She reports abdominal pain located in bilateral upper quadrants but started on Monday this was followed by nausea with vomiting, subsequently evolved to diarrhea today. She describes the vomitus as nonbloody and nonbilious. She reports feeling really hot with chills like sensation yesterday. She denies any recent antibiotic use. In addition also reports slight shortness of breath and mild cough with hoarseness today. Upon evaluation in the emergency room she was noted to have leukocytosis and CT abdomen pelvis revealed bilateral lower lobe pneumonia left greater than right.  Allergies:   Allergies  Allergen Reactions  . Codeine Rash      Past Medical History  Diagnosis Date  . Hypertension   . Hypothyroidism   . Diabetes mellitus   Glaucoma history of breast cancer Dyslipidemia      Past Surgical History  Procedure Date  . Breast surgery   . Cholecystectomy   . Thyroidectomy     Prior to Admission medications   Medication Sig Start Date End Date Taking? Authorizing Provider  ALPRAZolam Prudy Feeler) 0.5 MG tablet Take 0.5 mg by mouth daily.   Yes Historical Provider, MD  amLODipine (NORVASC) 5 MG tablet Take 5 mg by mouth daily.   Yes Historical Provider, MD  aspirin EC 81 MG tablet Take 81 mg by mouth daily.   Yes Historical Provider, MD  bimatoprost (LUMIGAN) 0.01 % SOLN Place 1 drop into both eyes at bedtime.   Yes Historical Provider, MD  brimonidine (ALPHAGAN P) 0.1 % SOLN Place 1 drop into the right eye 2 (two) times daily.   Yes Historical Provider, MD  brinzolamide (AZOPT) 1 % ophthalmic suspension Place 1 drop into the right eye 2 (two) times daily.   Yes Historical Provider, MD  CALCIUM-MAGNESIUM-ZINC PO Take 1 tablet by mouth daily.    Yes Historical Provider, MD  Carbonyl Iron (PERFECT IRON PO) Take 1 tablet by mouth daily.   Yes Historical Provider, MD  cycloSPORINE (RESTASIS) 0.05 % ophthalmic emulsion Place 1 drop into both eyes 2 (two) times daily.   Yes Historical Provider, MD  ezetimibe (ZETIA) 10 MG tablet Take 10 mg by mouth daily.   Yes Historical Provider, MD  glipiZIDE (GLUCOTROL) 5 MG tablet Take 7.5 mg by mouth daily.   Yes Historical Provider, MD  hydrochlorothiazide (HYDRODIURIL) 25 MG tablet Take 25 mg by mouth daily.   Yes Historical Provider, MD  levothyroxine (SYNTHROID, LEVOTHROID) 50 MCG tablet Take 50 mcg by mouth daily.   Yes Historical Provider, MD  lisinopril (PRINIVIL,ZESTRIL) 10 MG tablet Take 10 mg by mouth daily.   Yes Historical Provider, MD  meclizine (ANTIVERT) 25 MG tablet Take 25 mg by mouth every 4 (four) hours as needed. For vertigo   Yes Historical Provider, MD  metFORMIN (GLUMETZA) 500 MG (MOD) 24 hr tablet Take 500-1,000 mg by mouth daily with breakfast. 1 tablet in the morning  2 tablets at night   Yes Historical Provider, MD  Multiple Vitamin (MULITIVITAMIN WITH MINERALS) TABS Take 1 tablet by mouth daily.   Yes Historical Provider, MD  nitrofurantoin (MACRODANTIN) 100 MG capsule Take 100 mg by mouth 2 (two) times daily.   Yes Historical Provider, MD  omega-3 acid ethyl esters (LOVAZA) 1 G capsule Take 1 g by mouth daily.   Yes Historical  Provider, MD  traMADol (ULTRAM) 50 MG tablet Take 50 mg by mouth 4 (four) times daily as needed. For pain   Yes Historical Provider, MD  vitamin E 400 UNIT capsule Take 400 Units by mouth daily.   Yes Historical Provider, MD    Social History:Is by herself at home, is independent in ADLs, quit smoking over 50 years ago, denies any alcohol use   No family history on file.  Review of Systems: Positives bolded  Constitutional: Denies fever, chills, diaphoresis, appetite change and fatigue.  HEENT: Denies photophobia, eye pain, redness, hearing loss,  ear pain, congestion, sore throat, rhinorrhea, sneezing, mouth sores, trouble swallowing, neck pain, neck stiffness and tinnitus.   Respiratory: Denies SOB, DOE, cough, chest tightness,  and wheezing.   Cardiovascular: Denies chest pain, palpitations and leg swelling.  Gastrointestinal: Denies nausea, vomiting, abdominal pain, diarrhea, constipation, blood in stool and abdominal distention.  Genitourinary: Denies dysuria, urgency, frequency, hematuria, flank pain and difficulty urinating.  Musculoskeletal: Denies myalgias, back pain, joint swelling, arthralgias and gait problem.  Skin: Denies pallor, rash and wound.  Neurological: Denies dizziness, seizures, syncope, weakness, light-headedness, numbness and headaches.  Hematological: Denies adenopathy. Easy bruising, personal or family bleeding history  Psychiatric/Behavioral: Denies suicidal ideation, mood changes, confusion, nervousness, sleep disturbance and agitation   Physical Exam: Blood pressure 113/57, pulse 111, temperature 97.6 F (36.4 C), temperature source Oral, resp. rate 18, SpO2 97.00%. General exam: Elderly female laying in stretcher in no acute distress HEENT pupils equal reactive bladder mucosa moist neck no JVD or lymphadenopathy CVS S1-S2 regular rate rhythm mildly tachycardic no murmurs rubs or gallops Abdomen soft nontender with normal bowel sounds nondistended, no flank tenderness Extremities no edema clubbing or cyanosis Neuro moves all extremities no localizing signs Skin no rashes Musculoskeletal no obvious joint deformities or evidence of synovitis  Labs on Admission:  Results for orders placed during the hospital encounter of 05/19/12 (from the past 48 hour(s))  CBC     Status: Abnormal   Collection Time   05/19/12 11:28 AM      Component Value Range Comment   WBC 16.7 (*) 4.0 - 10.5 (K/uL)    RBC 4.72  3.87 - 5.11 (MIL/uL)    Hemoglobin 12.1  12.0 - 15.0 (g/dL)    HCT 16.1  09.6 - 04.5 (%)    MCV 77.8 (*)  78.0 - 100.0 (fL)    MCH 25.6 (*) 26.0 - 34.0 (pg)    MCHC 33.0  30.0 - 36.0 (g/dL)    RDW 40.9 (*) 81.1 - 15.5 (%)    Platelets 295  150 - 400 (K/uL)   DIFFERENTIAL     Status: Abnormal   Collection Time   05/19/12 11:28 AM      Component Value Range Comment   Neutrophils Relative 85 (*) 43 - 77 (%)    Neutro Abs 14.1 (*) 1.7 - 7.7 (K/uL)    Lymphocytes Relative 8 (*) 12 - 46 (%)    Lymphs Abs 1.3  0.7 - 4.0 (K/uL)    Monocytes Relative 8  3 - 12 (%)    Monocytes Absolute 1.3 (*) 0.1 - 1.0 (K/uL)    Eosinophils Relative 0  0 - 5 (%)    Eosinophils Absolute 0.0  0.0 - 0.7 (K/uL)    Basophils Relative 0  0 - 1 (%)    Basophils Absolute 0.0  0.0 - 0.1 (K/uL)   COMPREHENSIVE METABOLIC PANEL     Status: Abnormal   Collection  Time   05/19/12 11:28 AM      Component Value Range Comment   Sodium 127 (*) 135 - 145 (mEq/L)    Potassium 3.2 (*) 3.5 - 5.1 (mEq/L)    Chloride 91 (*) 96 - 112 (mEq/L)    CO2 21  19 - 32 (mEq/L)    Glucose, Bld 340 (*) 70 - 99 (mg/dL)    BUN 41 (*) 6 - 23 (mg/dL)    Creatinine, Ser 4.69  0.50 - 1.10 (mg/dL)    Calcium 62.9  8.4 - 10.5 (mg/dL)    Total Protein 7.5  6.0 - 8.3 (g/dL)    Albumin 3.4 (*) 3.5 - 5.2 (g/dL)    AST 47 (*) 0 - 37 (U/L)    ALT 52 (*) 0 - 35 (U/L)    Alkaline Phosphatase 68  39 - 117 (U/L)    Total Bilirubin 0.7  0.3 - 1.2 (mg/dL)    GFR calc non Af Amer 55 (*) >90 (mL/min)    GFR calc Af Amer 64 (*) >90 (mL/min)   LIPASE, BLOOD     Status: Normal   Collection Time   05/19/12 11:28 AM      Component Value Range Comment   Lipase 32  11 - 59 (U/L)   URINALYSIS, ROUTINE W REFLEX MICROSCOPIC     Status: Abnormal   Collection Time   05/19/12  2:17 PM      Component Value Range Comment   Color, Urine YELLOW  YELLOW     APPearance CLOUDY (*) CLEAR     Specific Gravity, Urine 1.021  1.005 - 1.030     pH 5.5  5.0 - 8.0     Glucose, UA 100 (*) NEGATIVE (mg/dL)    Hgb urine dipstick NEGATIVE  NEGATIVE     Bilirubin Urine NEGATIVE  NEGATIVE      Ketones, ur NEGATIVE  NEGATIVE (mg/dL)    Protein, ur NEGATIVE  NEGATIVE (mg/dL)    Urobilinogen, UA 0.2  0.0 - 1.0 (mg/dL)    Nitrite POSITIVE (*) NEGATIVE     Leukocytes, UA MODERATE (*) NEGATIVE    URINE MICROSCOPIC-ADD ON     Status: Abnormal   Collection Time   05/19/12  2:17 PM      Component Value Range Comment   Squamous Epithelial / LPF RARE  RARE     WBC, UA 3-6  <3 (WBC/hpf)    RBC / HPF 0-2  <3 (RBC/hpf)    Bacteria, UA MANY (*) RARE     Casts HYALINE CASTS (*) NEGATIVE  GRANULAR CAST    Radiological Exams on Admission: Dg Chest 2 View  05/19/2012  *RADIOLOGY REPORT*  Clinical Data: Abdominal pain with weakness and diarrhea.  History of hypertension and diabetes.  CHEST - 2 VIEW  Comparison: 05/15/2006 radiographs.  Findings: The heart size and mediastinal contours are normal. The lungs are clear. There is no pleural effusion or pneumothorax. No acute osseous findings are identified. There are stable postsurgical changes in the left paratracheal region.  IMPRESSION: Stable examination.  No active cardiopulmonary process.  Original Report Authenticated By: Gerrianne Scale, M.D.   Ct Abdomen Pelvis W Contrast  05/19/2012  *RADIOLOGY REPORT*  Clinical Data: Abdominal pain over past few days with prominent which has subsided.  Diarrhea.  CT ABDOMEN AND PELVIS WITH CONTRAST  Technique:  Multidetector CT imaging of the abdomen and pelvis was performed following the standard protocol during bolus administration of intravenous contrast.  Contrast:  100  ml Omnipaque-300.  Comparison: None.  Findings: Dilated small bowel loops which are located anterior to the transverse colon (which may be related to ligamentum laxity rather than an internal hernia).  Small bowel loops are not dilated to the lower left abdomen where there is an abrupt change in caliber.  This may be related to adhesion.  Discrete mass at this level not identified (series 2 image 43).  No free fluid or free intraperitoneal  air.  Fatty containing periumbilical hernia.  No bowel containing worrisome wall hernia.  Bilateral lower lobe infiltrates greater on the left.  Coronary artery calcifications.  Mitral valve and aortic valve calcifications.  Mild cardiomegaly.  Atherosclerotic type changes of the abdominal aorta with ectasia. Atherosclerotic type changes extends into the aortic branch vessels and iliac arteries.  Prior left hip surgery with streak artifact. Right hip joint degenerative changes.  Degenerative changes most notable L5-S1.  Noncontrast filled views of the urinary bladder unremarkable.  Post cholecystectomy.  Mild prominence intrahepatic biliary ducts felt to be related to the post cholecystectomy state.  Mildly lobulated contour of the liver without other findings to suggest cirrhosis. Probable small hepatic cyst.  No focal splenic, pancreatic or adrenal lesion.  Bilateral adrenal gland hyperplasia.  IMPRESSION: Small bowel loops are not dilated to the lower left abdomen where there is an abrupt change in caliber.  This may be related to adhesions.  Discrete mass at this level not identified (series 2 image 43).  No free fluid or free intraperitoneal air.  Bilateral lower lobe infiltrates greater on the left.  Atherosclerotic type changes as detailed above.  Adrenal gland hyperplasia with nodularity.  Slightly lobulated contour of the liver without other findings of cirrhosis.  Please see above.  Original Report Authenticated By: Fuller Canada, M.D.    Assessment/Plan 1. Community acquired pneumonia with SIRS Continue IV levofloxacin Check urine Legionella and pneumococcal antigen IV fluids, oxygen, supportive care 2.  DM (diabetes mellitus): Continue glipizide, hold metformin due to contrast today, sliding scale 3.  HTN (hypertension), benign: Hold antihypertensives for now 4.  Abdominal  Pain: Suspect secondary to lower lobe pneumonia CT benign, Lipase/LFTs normal, check urine Legionella antigen given  diarrhea   if diarrhea persists will check C. difficile PCR 5. hypothyroidism: Continue Synthroid 6. Possible UTI: continue Levaquin, FU urine cultures  CODE STATUS discussed the patient: Wishes to be a DO NOT RESUSCITATE   Time Spent on Admission:  [facility] Pager: [phone] 05/19/2012, 4:42 PM

## 2012-05-19 NOTE — ED Notes (Signed)
ABdominal pain began on MOnday whe reports having upper abdominal pain all day and began with vomiting Monday night She reports yesterday feeling fine and then last night started with n/v again and this am Diarrhea.  She also has a hoarse voice. She denies any abdominal pain at present time

## 2012-05-19 NOTE — ED Notes (Signed)
Attempted to call report x1, stated RN would call me back

## 2012-05-20 DIAGNOSIS — D696 Thrombocytopenia, unspecified: Secondary | ICD-10-CM

## 2012-05-20 DIAGNOSIS — K560 Paralytic ileus: Secondary | ICD-10-CM

## 2012-05-20 DIAGNOSIS — R109 Unspecified abdominal pain: Secondary | ICD-10-CM

## 2012-05-20 DIAGNOSIS — K56609 Unspecified intestinal obstruction, unspecified as to partial versus complete obstruction: Secondary | ICD-10-CM

## 2012-05-20 DIAGNOSIS — E1165 Type 2 diabetes mellitus with hyperglycemia: Secondary | ICD-10-CM

## 2012-05-20 DIAGNOSIS — R6510 Systemic inflammatory response syndrome (SIRS) of non-infectious origin without acute organ dysfunction: Secondary | ICD-10-CM

## 2012-05-20 LAB — GLUCOSE, CAPILLARY
Glucose-Capillary: 178 mg/dL — ABNORMAL HIGH (ref 70–99)
Glucose-Capillary: 183 mg/dL — ABNORMAL HIGH (ref 70–99)
Glucose-Capillary: 61 mg/dL — ABNORMAL LOW (ref 70–99)
Glucose-Capillary: 121 mg/dL — ABNORMAL HIGH (ref 70–99)
Glucose-Capillary: 116 mg/dL — ABNORMAL HIGH (ref 70–99)

## 2012-05-20 LAB — COMPREHENSIVE METABOLIC PANEL
ALT: 34 U/L (ref 0–35)
AST: 41 U/L — ABNORMAL HIGH (ref 0–37)
Albumin: 2.5 g/dL — ABNORMAL LOW (ref 3.5–5.2)
Alkaline Phosphatase: 102 U/L (ref 39–117)
BUN: 30 mg/dL — ABNORMAL HIGH (ref 6–23)
Potassium: 3.1 mEq/L — ABNORMAL LOW (ref 3.5–5.1)
Sodium: 131 mEq/L — ABNORMAL LOW (ref 135–145)
Total Protein: 5.8 g/dL — ABNORMAL LOW (ref 6.0–8.3)

## 2012-05-20 LAB — CBC
MCHC: 32.5 g/dL (ref 30.0–36.0)
Platelets: 223 10*3/uL (ref 150–400)
RDW: 16.6 % — ABNORMAL HIGH (ref 11.5–15.5)
WBC: 16.5 10*3/uL — ABNORMAL HIGH (ref 4.0–10.5)

## 2012-05-20 LAB — LEGIONELLA ANTIGEN, URINE: Legionella Antigen, Urine: NEGATIVE

## 2012-05-20 LAB — STREP PNEUMONIAE URINARY ANTIGEN: Strep Pneumo Urinary Antigen: NEGATIVE

## 2012-05-20 MED ORDER — SODIUM CHLORIDE 0.9 % IV SOLN
INTRAVENOUS | Status: DC
  Administered 2012-05-20: 11:00:00 via INTRAVENOUS
  Filled 2012-05-20 (×3): qty 1000

## 2012-05-20 MED ORDER — DEXTROSE 50 % IV SOLN
INTRAVENOUS | Status: AC
  Administered 2012-05-20: 25 mL via INTRAVENOUS
  Filled 2012-05-20: qty 50

## 2012-05-20 MED ORDER — POTASSIUM CHLORIDE 10 MEQ/100ML IV SOLN
10.0000 meq | INTRAVENOUS | Status: AC
  Administered 2012-05-20 (×3): 10 meq via INTRAVENOUS
  Filled 2012-05-20 (×3): qty 100

## 2012-05-20 MED ORDER — DEXTROSE 50 % IV SOLN
25.0000 mL | Freq: Once | INTRAVENOUS | Status: AC | PRN
  Administered 2012-05-20: 25 mL via INTRAVENOUS

## 2012-05-20 MED ORDER — BRINZOLAMIDE 1 % OP SUSP
1.0000 [drp] | Freq: Two times a day (BID) | OPHTHALMIC | Status: DC
  Administered 2012-05-20 – 2012-05-22 (×4): 1 [drp] via OPHTHALMIC

## 2012-05-20 MED ORDER — BIMATOPROST 0.01 % OP SOLN
1.0000 [drp] | Freq: Every day | OPHTHALMIC | Status: DC
  Administered 2012-05-20 – 2012-05-21 (×2): 1 [drp] via OPHTHALMIC

## 2012-05-20 MED ORDER — BRIMONIDINE TARTRATE 0.2 % OP SOLN
1.0000 [drp] | Freq: Two times a day (BID) | OPHTHALMIC | Status: DC
  Administered 2012-05-21 – 2012-05-22 (×3): 1 [drp] via OPHTHALMIC

## 2012-05-20 MED ORDER — POTASSIUM CHLORIDE 2 MEQ/ML IV SOLN
INTRAVENOUS | Status: DC
  Administered 2012-05-20 – 2012-05-22 (×3): via INTRAVENOUS
  Filled 2012-05-20 (×4): qty 1000

## 2012-05-20 MED ORDER — BRIMONIDINE TARTRATE 0.2 % OP SOLN
1.0000 [drp] | Freq: Two times a day (BID) | OPHTHALMIC | Status: DC
  Administered 2012-05-20: 1 [drp] via OPHTHALMIC
  Filled 2012-05-20: qty 5

## 2012-05-20 MED ORDER — CYCLOSPORINE 0.05 % OP EMUL
1.0000 [drp] | Freq: Two times a day (BID) | OPHTHALMIC | Status: DC
  Administered 2012-05-20 – 2012-05-22 (×4): 1 [drp] via OPHTHALMIC
  Filled 2012-05-20 (×2): qty 1

## 2012-05-20 MED ORDER — CYCLOSPORINE 0.05 % OP EMUL
1.0000 [drp] | Freq: Two times a day (BID) | OPHTHALMIC | Status: DC
  Administered 2012-05-20: 1 [drp] via OPHTHALMIC
  Filled 2012-05-20 (×3): qty 1

## 2012-05-20 NOTE — Care Management Note (Unsigned)
    Page 1 of 1   05/20/2012     4:07:05 PM   CARE MANAGEMENT NOTE 05/20/2012  Patient:  Melissa Mccullough, Melissa Mccullough   Account Number:  [id number]  Date Initiated:  05/20/2012  Documentation initiated by:  Letha Cape  Subjective/Objective Assessment:   dx pna, abd pain  admit- lives alone.     Action/Plan:   pt eval   Anticipated DC Date:  05/23/2012   Anticipated DC Plan:  HOME W HOME HEALTH SERVICES      DC Planning Services  CM consult      Choice offered to / List presented to:             Status of service:  In process, will continue to follow Medicare Important Message given?   (If response is "NO", the following Medicare IM given date fields will be blank) Date Medicare IM given:   Date Additional Medicare IM given:    Discharge Disposition:    Per UR Regulation:  Reviewed for med. necessity/level of care/duration of stay  If discussed at Long Length of Stay Meetings, dates discussed:    Comments:  05/20/12 16:02 Letha Cape RN, BSN [phone] patient lives alone, awaiting pt eval.  Patient has medication coverage and transportation.  Her son Melissa Mccullough takes her to her MD appts.  NCM will continue to follow for dc needs.

## 2012-05-20 NOTE — Consult Note (Signed)
I have interviewed and examined this patient. I agree with the evaluation treatment plan outlined by Ms. Polite, PA.  This patient has had nausea and vomiting for 3 or 4 days, and she also reports numerous loose stools over the past 36-48 hours. She is having much less abdominal discomfort this morning. She ate breakfast but now has been made n.p.o. Past history significant for open cholecystectomy.  On exam her abdomen is somewhat distended and tympanitic but there is no sign of any significant tenderness or peritoneal irritation, there is no mass or hernia, the right subcostal incision is well-healed.  A CT scan again her pelvis suggest some proximal small bowel dilatation and a transition zone although it is not impressive for inflammatory change or high-grade obstruction.  Assessment: Partial SBO versus ileus versus gastroenteritis. There is no evidence for compromised bowel.  Bilateral lower lobe pneumonia by CT. Only mildly symptomatic  Status post open cholecystectomy  Diabetes mellitus  History of breast cancer  Hypertension  Plan for now I would keep her n.p.o., and we will check abdominal x-rays tomorrow. If she vomits I would place a nasogastric tube for decompression.  Otherwise, I agree with treatment of identified infections and control of her diabetes.  Will follow.   Angelia Mould. Derrell Lolling, M.D., [facility] General and Minimally invasive Surgery Breast and Colorectal Surgery Office:   [phone] Pager:   [phone]

## 2012-05-20 NOTE — Progress Notes (Signed)
PATIENT DETAILS Name: Melissa Mccullough Age: 78 y.o. Sex: female Date of Birth: 1928/03/21 Admit Date: 05/19/2012 ZOX:[id number],[id number] DAVID, MD, MD  Interim History: Melissa Mccullough is a very pleasant 78 year old white female with history of diabetes, hypertension, hypothyroidism was in her usual state of health until Monday.  She reports abdominal pain located in bilateral upper quadrants but started on Monday this was followed by nausea with vomiting, subsequently evolved to diarrhea today. She describes the vomitus as nonbloody and nonbilious. She reports feeling really hot with chills like sensation yesterday. She denies any recent antibiotic use. In addition also reports slight shortness of breath and mild cough with hoarseness today. Upon evaluation in the emergency room she was noted to have leukocytosis and CT abdomen pelvis revealed bilateral lower lobe pneumonia left greater than right, as well as possible SBO.  CCS was consulted.  Consultations:  1.  [facility], Dr. Claud Kelp  Subjective: Patient reports vomiting and diarrhea have slowed since prior to admission.  She complains that her eye drops are very important and need to be given at specific times.  Objective: Weight change:   Intake/Output Summary (Last 24 hours) at 05/20/12 1536 Last data filed at 05/20/12 1522  Gross per 24 hour  Intake   1670 ml  Output    325 ml  Net   1345 ml   Blood pressure 104/66, pulse 84, temperature 97.8 F (36.6 C), temperature source Oral, resp. rate 16, height 5\' 2"  (1.575 m), weight 74.571 kg (164 lb 6.4 oz), SpO2 95.00%. Filed Vitals:   05/19/12 2019 05/20/12 0633 05/20/12 1002 05/20/12 1400  BP: 121/69 135/75  104/66  Pulse: 103 106  84  Temp: 98 F (36.7 C) 97.7 F (36.5 C) 97.9 F (36.6 C) 97.8 F (36.6 C)  TempSrc: Oral Oral Oral Oral  Resp: 20 18  16   Height:      Weight:      SpO2: 94% 93%  95%    Physical Exam: General: No acute distress, Area  surrounding eyes is pink, patient appears ill. Lungs: Coarse breath sounds bilaterally, no increased work of breathing.   Cardiovascular: Regular rate and rhythm without murmur gallop or rub normal S1 and S2 Abdomen: Nontender, nondistended, soft, bowel sounds positive, no rebound, no ascites, no appreciable mass Extremities: No significant cyanosis, clubbing, or edema bilateral lower extremities  Basic Metabolic Panel:  Lab 05/20/12 4782 05/19/12 1811 05/19/12 1128  NA 131* -- 127*  K 3.1* -- 3.2*  CL 96 -- 91*  CO2 24 -- 21  GLUCOSE 134* -- 340*  BUN 30* -- 41*  CREATININE 0.75 0.81 --  CALCIUM 8.7 -- 10.0  MG -- -- --  PHOS -- -- --   Liver Function Tests:  Lab 05/20/12 0555 05/19/12 1128  AST 41* 47*  ALT 34 52*  ALKPHOS 102 68  BILITOT 0.4 0.7  PROT 5.8* 7.5  ALBUMIN 2.5* 3.4*    Lab 05/19/12 1128  LIPASE 32  AMYLASE --   CBC:  Lab 05/20/12 0555 05/19/12 1811 05/19/12 1128  WBC 16.5* 15.6* --  NEUTROABS -- -- 14.1*  HGB 10.1* 10.9* --  HCT 31.1* 33.1* --  MCV 77.8* 78.1 --  PLT 223 237 --   Cardiac Enzymes:  Lab 05/19/12 2045  CKTOTAL --  CKMB --  CKMBINDEX --  TROPONINI <0.30   CBG:  Lab 05/20/12 1156 05/20/12 0745 05/19/12 2204 05/19/12 1744  GLUCAP 183* 178* 153* 229*    Studies/Results: Scheduled Meds:    .  ALPRAZolam  0.5 mg Oral Daily  . aspirin EC  81 mg Oral Daily  . bimatoprost  1 drop Both Eyes QHS  . brimonidine  1 drop Both Eyes BID  . brinzolamide  1 drop Right Eye BID  . cycloSPORINE  1 drop Both Eyes BID  . enoxaparin (LOVENOX) injection  40 mg Subcutaneous Q24H  . ezetimibe  10 mg Oral Daily  . glipiZIDE  7.5 mg Oral Daily  . insulin aspart  0-15 Units Subcutaneous TID WC  . levofloxacin (LEVAQUIN) IV  750 mg Intravenous Q24H  . levothyroxine  50 mcg Oral Daily  . multivitamin with minerals  1 tablet Oral Daily  . pantoprazole  40 mg Oral Q1200  . potassium chloride  10 mEq Intravenous Q1 Hr x 3  . DISCONTD:  bimatoprost  1 drop Both Eyes QHS  . DISCONTD: brimonidine  1 drop Right Eye BID  . DISCONTD: brimonidine  1 drop Right Eye BID  . DISCONTD: brimonidine  1 drop Right Eye BID  . DISCONTD: brimonidine  1 drop Both Eyes BID  . DISCONTD: brinzolamide  1 drop Right Eye BID  . DISCONTD: cycloSPORINE  1 drop Both Eyes BID  . DISCONTD: cycloSPORINE  1 drop Both Eyes BID  . DISCONTD: enoxaparin  30 mg Subcutaneous Q24H  . DISCONTD:  morphine injection  2 mg Intravenous Once  . DISCONTD: ondansetron (ZOFRAN) IV  4 mg Intravenous Once   Continuous Infusions:    . sodium chloride 0.9 % 1,000 mL with potassium chloride 40 mEq infusion 125 mL/hr at 05/20/12 1055  . DISCONTD: sodium chloride 100 mL/hr at 05/20/12 0500   PRN Meds:.HYDROmorphone (DILAUDID) injection, ondansetron (ZOFRAN) IV, ondansetron, traMADol  Anti-infectives:  Anti-infectives     Start     Dose/Rate Route Frequency Ordered Stop   05/19/12 1515   levofloxacin (LEVAQUIN) IVPB 750 mg        750 mg 100 mL/hr over 90 Minutes Intravenous Every 24 hours 05/19/12 1505            Assessment/Plan: Active Problems:  Community acquired pneumonia  DM (diabetes mellitus)  HTN (hypertension), benign  Abdominal  pain, other specified site   1.  CAP.  Levaquin started at admission, strep pneumo urinary antigen is negative. Blood cultures pending.Still with leukocytosis, but afebrile. 2.  UTI.  U/A positive, awaiting culture results.On Levaquin-should cover UTI. 3.  DM.  Will d/c orals and continue with SSI as she is NPO. Resume Metformin at discharge 4.  BP.  Antihypertensives being held.  BP low to normal 5.  SBO.  CCS on board. (THANK YOU)  NPO for now.  Vomiting has ceased.  Conservative approach for now. Xray in am. Patient with diarrhea- will check C-diff. 6.  Glaucoma and Macular Degeneration.  Home medications continued.  Dosing times adjusted per patient request. 7.  Hypothyroidism.  Continue Synthroid. 8.  Code Status:   DNR 9.  Disposition:  To home when medically stable.  PT eval ordered.  DVT Prophylaxis-prophylactic Lovenox   LOS: 1 day   Stephani Police 05/20/2012, 3:36 PM [phone]  Attending -I have seen and examined the patient, I agree with the assessment and plan as outlined above.Her abdominal pain is likely from SBO, found to have PNA incidentally. Will see if she responds to conservative therapy.  Dr Windell Norfolk

## 2012-05-20 NOTE — Consult Note (Signed)
Reason for Consult:SBO Referring Physician: Stormy Card, PA-C  Melissa Mccullough is an 78 y.o. female.  HPI: Patient is an 78 yr old female who presented to the [facility] with several day history of abdominal pain with nausea and vomiting that has progressed to diarrhea as well.  She has also had feelings of being hot followed by chills.  Her abdominal pain started on Monday and progressed to n/v.  In the last 2 days she has now developed diarrhea.  She reports that today she is not having nausea or vomiting and actually tolerated a portion of eggs for breakfast.  She has been drinking ok as well.  She denies urinary symptoms, neuro symptoms, chest pain but has had some shortness of breath.  A CT of the abd/pelvis was performed which showed a SBO and bilateral lower lobe pneumonia.  Past Medical History  Diagnosis Date  . Hypertension   . Hypothyroidism   . Diabetes mellitus     Past Surgical History  Procedure Date  . Breast surgery   . Cholecystectomy   . Thyroidectomy     No family history on file.  Social History:  reports that she has quit smoking. She does not have any smokeless tobacco history on file. She reports that she does not drink alcohol or use illicit drugs.  Allergies:  Allergies  Allergen Reactions  . Codeine Rash    Medications: I have reviewed the patient's current medications.  Results for orders placed during the hospital encounter of 05/19/12 (from the past 48 hour(s))  CBC     Status: Abnormal   Collection Time   05/19/12 11:28 AM      Component Value Range Comment   WBC 16.7 (*) 4.0 - 10.5 (K/uL)    RBC 4.72  3.87 - 5.11 (MIL/uL)    Hemoglobin 12.1  12.0 - 15.0 (g/dL)    HCT 32.2  02.5 - 42.7 (%)    MCV 77.8 (*) 78.0 - 100.0 (fL)    MCH 25.6 (*) 26.0 - 34.0 (pg)    MCHC 33.0  30.0 - 36.0 (g/dL)    RDW 06.2 (*) 37.6 - 15.5 (%)    Platelets 295  150 - 400 (K/uL)   DIFFERENTIAL     Status: Abnormal   Collection Time   05/19/12 11:28 AM      Component  Value Range Comment   Neutrophils Relative 85 (*) 43 - 77 (%)    Neutro Abs 14.1 (*) 1.7 - 7.7 (K/uL)    Lymphocytes Relative 8 (*) 12 - 46 (%)    Lymphs Abs 1.3  0.7 - 4.0 (K/uL)    Monocytes Relative 8  3 - 12 (%)    Monocytes Absolute 1.3 (*) 0.1 - 1.0 (K/uL)    Eosinophils Relative 0  0 - 5 (%)    Eosinophils Absolute 0.0  0.0 - 0.7 (K/uL)    Basophils Relative 0  0 - 1 (%)    Basophils Absolute 0.0  0.0 - 0.1 (K/uL)   COMPREHENSIVE METABOLIC PANEL     Status: Abnormal   Collection Time   05/19/12 11:28 AM      Component Value Range Comment   Sodium 127 (*) 135 - 145 (mEq/L)    Potassium 3.2 (*) 3.5 - 5.1 (mEq/L)    Chloride 91 (*) 96 - 112 (mEq/L)    CO2 21  19 - 32 (mEq/L)    Glucose, Bld 340 (*) 70 - 99 (mg/dL)  BUN 41 (*) 6 - 23 (mg/dL)    Creatinine, Ser 1.61  0.50 - 1.10 (mg/dL)    Calcium 09.6  8.4 - 10.5 (mg/dL)    Total Protein 7.5  6.0 - 8.3 (g/dL)    Albumin 3.4 (*) 3.5 - 5.2 (g/dL)    AST 47 (*) 0 - 37 (U/L)    ALT 52 (*) 0 - 35 (U/L)    Alkaline Phosphatase 68  39 - 117 (U/L)    Total Bilirubin 0.7  0.3 - 1.2 (mg/dL)    GFR calc non Af Amer 55 (*) >90 (mL/min)    GFR calc Af Amer 64 (*) >90 (mL/min)   LIPASE, BLOOD     Status: Normal   Collection Time   05/19/12 11:28 AM      Component Value Range Comment   Lipase 32  11 - 59 (U/L)   URINALYSIS, ROUTINE W REFLEX MICROSCOPIC     Status: Abnormal   Collection Time   05/19/12  2:17 PM      Component Value Range Comment   Color, Urine YELLOW  YELLOW     APPearance CLOUDY (*) CLEAR     Specific Gravity, Urine 1.021  1.005 - 1.030     pH 5.5  5.0 - 8.0     Glucose, UA 100 (*) NEGATIVE (mg/dL)    Hgb urine dipstick NEGATIVE  NEGATIVE     Bilirubin Urine NEGATIVE  NEGATIVE     Ketones, ur NEGATIVE  NEGATIVE (mg/dL)    Protein, ur NEGATIVE  NEGATIVE (mg/dL)    Urobilinogen, UA 0.2  0.0 - 1.0 (mg/dL)    Nitrite POSITIVE (*) NEGATIVE     Leukocytes, UA MODERATE (*) NEGATIVE    URINE MICROSCOPIC-ADD ON     Status:  Abnormal   Collection Time   05/19/12  2:17 PM      Component Value Range Comment   Squamous Epithelial / LPF RARE  RARE     WBC, UA 3-6  <3 (WBC/hpf)    RBC / HPF 0-2  <3 (RBC/hpf)    Bacteria, UA MANY (*) RARE     Casts HYALINE CASTS (*) NEGATIVE  GRANULAR CAST  LACTIC ACID, PLASMA     Status: Abnormal   Collection Time   05/19/12  3:55 PM      Component Value Range Comment   Lactic Acid, Venous 3.3 (*) 0.5 - 2.2 (mmol/L)   PROCALCITONIN     Status: Normal   Collection Time   05/19/12  3:55 PM      Component Value Range Comment   Procalcitonin 0.93     GLUCOSE, CAPILLARY     Status: Abnormal   Collection Time   05/19/12  5:44 PM      Component Value Range Comment   Glucose-Capillary 229 (*) 70 - 99 (mg/dL)   CBC     Status: Abnormal   Collection Time   05/19/12  6:11 PM      Component Value Range Comment   WBC 15.6 (*) 4.0 - 10.5 (K/uL)    RBC 4.24  3.87 - 5.11 (MIL/uL)    Hemoglobin 10.9 (*) 12.0 - 15.0 (g/dL)    HCT 04.5 (*) 40.9 - 46.0 (%)    MCV 78.1  78.0 - 100.0 (fL)    MCH 25.7 (*) 26.0 - 34.0 (pg)    MCHC 32.9  30.0 - 36.0 (g/dL)    RDW 81.1 (*) 91.4 - 15.5 (%)    Platelets 237  150 - 400 (K/uL)   CREATININE, SERUM     Status: Abnormal   Collection Time   05/19/12  6:11 PM      Component Value Range Comment   Creatinine, Ser 0.81  0.50 - 1.10 (mg/dL)    GFR calc non Af Amer 66 (*) >90 (mL/min)    GFR calc Af Amer 77 (*) >90 (mL/min)   TROPONIN I     Status: Normal   Collection Time   05/19/12  8:45 PM      Component Value Range Comment   Troponin I <0.30  <0.30 (ng/mL)   GLUCOSE, CAPILLARY     Status: Abnormal   Collection Time   05/19/12 10:04 PM      Component Value Range Comment   Glucose-Capillary 153 (*) 70 - 99 (mg/dL)    Comment 1 Documented in Chart      Comment 2 Notify RN     CBC     Status: Abnormal   Collection Time   05/20/12  5:55 AM      Component Value Range Comment   WBC 16.5 (*) 4.0 - 10.5 (K/uL)    RBC 4.00  3.87 - 5.11 (MIL/uL)    Hemoglobin  10.1 (*) 12.0 - 15.0 (g/dL)    HCT 16.1 (*) 09.6 - 46.0 (%)    MCV 77.8 (*) 78.0 - 100.0 (fL)    MCH 25.3 (*) 26.0 - 34.0 (pg)    MCHC 32.5  30.0 - 36.0 (g/dL)    RDW 04.5 (*) 40.9 - 15.5 (%)    Platelets 223  150 - 400 (K/uL)   COMPREHENSIVE METABOLIC PANEL     Status: Abnormal   Collection Time   05/20/12  5:55 AM      Component Value Range Comment   Sodium 131 (*) 135 - 145 (mEq/L)    Potassium 3.1 (*) 3.5 - 5.1 (mEq/L)    Chloride 96  96 - 112 (mEq/L)    CO2 24  19 - 32 (mEq/L)    Glucose, Bld 134 (*) 70 - 99 (mg/dL)    BUN 30 (*) 6 - 23 (mg/dL)    Creatinine, Ser 8.11  0.50 - 1.10 (mg/dL)    Calcium 8.7  8.4 - 10.5 (mg/dL)    Total Protein 5.8 (*) 6.0 - 8.3 (g/dL)    Albumin 2.5 (*) 3.5 - 5.2 (g/dL)    AST 41 (*) 0 - 37 (U/L)    ALT 34  0 - 35 (U/L)    Alkaline Phosphatase 102  39 - 117 (U/L)    Total Bilirubin 0.4  0.3 - 1.2 (mg/dL)    GFR calc non Af Amer 77 (*) >90 (mL/min)    GFR calc Af Amer 90 (*) >90 (mL/min)   STREP PNEUMONIAE URINARY ANTIGEN     Status: Normal   Collection Time   05/20/12  7:38 AM      Component Value Range Comment   Strep Pneumo Urinary Antigen NEGATIVE  NEGATIVE    GLUCOSE, CAPILLARY     Status: Abnormal   Collection Time   05/20/12  7:45 AM      Component Value Range Comment   Glucose-Capillary 178 (*) 70 - 99 (mg/dL)     Dg Chest 2 View  08/15/4781  *RADIOLOGY REPORT*  Clinical Data: Abdominal pain with weakness and diarrhea.  History of hypertension and diabetes.  CHEST - 2 VIEW  Comparison: 05/15/2006 radiographs.  Findings: The heart size and mediastinal contours are normal.  The lungs are clear. There is no pleural effusion or pneumothorax. No acute osseous findings are identified. There are stable postsurgical changes in the left paratracheal region.  IMPRESSION: Stable examination.  No active cardiopulmonary process.  Original Report Authenticated By: Gerrianne Scale, M.D.   Ct Abdomen Pelvis W Contrast  05/20/2012  **ADDENDUM** CREATED:  05/20/2012 07:28:02  The small bowel dilation with change in caliber is consistent with a small bowel obstruction.  This was discussed with D. York physician's assistant on 5500.  **END ADDENDUM** SIGNED BY: Almedia Balls. Constance Goltz, M.D.   05/19/2012  *RADIOLOGY REPORT*  Clinical Data: Abdominal pain over past few days with prominent which has subsided.  Diarrhea.  CT ABDOMEN AND PELVIS WITH CONTRAST  Technique:  Multidetector CT imaging of the abdomen and pelvis was performed following the standard protocol during bolus administration of intravenous contrast.  Contrast:  100 ml Omnipaque-300.  Comparison: None.  Findings: Dilated small bowel loops which are located anterior to the transverse colon (which may be related to ligamentum laxity rather than an internal hernia).  Small bowel loops are not dilated to the lower left abdomen where there is an abrupt change in caliber.  This may be related to adhesion.  Discrete mass at this level not identified (series 2 image 43).  No free fluid or free intraperitoneal air.  Fatty containing periumbilical hernia.  No bowel containing worrisome wall hernia.  Bilateral lower lobe infiltrates greater on the left.  Coronary artery calcifications.  Mitral valve and aortic valve calcifications.  Mild cardiomegaly.  Atherosclerotic type changes of the abdominal aorta with ectasia. Atherosclerotic type changes extends into the aortic branch vessels and iliac arteries.  Prior left hip surgery with streak artifact. Right hip joint degenerative changes.  Degenerative changes most notable L5-S1.  Noncontrast filled views of the urinary bladder unremarkable.  Post cholecystectomy.  Mild prominence intrahepatic biliary ducts felt to be related to the post cholecystectomy state.  Mildly lobulated contour of the liver without other findings to suggest cirrhosis. Probable small hepatic cyst.  No focal splenic, pancreatic or adrenal lesion.  Bilateral adrenal gland hyperplasia.  IMPRESSION: Small  bowel loops are not dilated to the lower left abdomen where there is an abrupt change in caliber.  This may be related to adhesions.  Discrete mass at this level not identified (series 2 image 43).  No free fluid or free intraperitoneal air.  Bilateral lower lobe infiltrates greater on the left.  Atherosclerotic type changes as detailed above.  Adrenal gland hyperplasia with nodularity.  Slightly lobulated contour of the liver without other findings of cirrhosis.  Please see above.  Original Report Authenticated By: Fuller Canada, M.D.    Review of Systems  Constitutional: Positive for fever, chills and malaise/fatigue.  HENT: Negative for hearing loss, congestion, sore throat and neck pain.   Eyes: Positive for blurred vision (chronic).  Respiratory: Positive for cough and shortness of breath.   Cardiovascular: Negative for chest pain, palpitations and leg swelling.  Gastrointestinal: Positive for nausea, vomiting, abdominal pain and diarrhea. Negative for heartburn.  Genitourinary: Negative for dysuria and urgency.  Musculoskeletal: Negative for myalgias.  Skin: Negative for rash.  Neurological: Positive for weakness. Negative for dizziness, speech change, loss of consciousness and headaches.  Psychiatric/Behavioral: Negative for depression.   Blood pressure 135/75, pulse 106, temperature 97.9 F (36.6 C), temperature source Oral, resp. rate 18, height 5\' 2"  (1.575 m), weight 164 lb 6.4 oz (74.571 kg), SpO2 93.00%. Physical Exam  Constitutional: She  is oriented to person, place, and time. She appears well-developed and well-nourished. No distress.  HENT:  Head: Normocephalic and atraumatic.  Mouth/Throat: Oropharynx is clear and moist.  Eyes: Pupils are equal, round, and reactive to light.       Some surrounding redness and clear drainage from eyes bilateral  Neck: Normal range of motion. Neck supple.  Cardiovascular: Normal rate and regular rhythm.   Respiratory: Effort normal. No  respiratory distress. She has rales (lower lobes).  GI: Soft. She exhibits no distension. There is tenderness (mildly).       Bowel sounds slightly hypoactive  Musculoskeletal: She exhibits no edema and no tenderness.  Neurological: She is alert and oriented to person, place, and time.  Skin: Skin is warm and dry. No erythema.  Psychiatric: She has a normal mood and affect. Her behavior is normal. Thought content normal.    Assessment/Plan: 1.  SBO: will make the patient NPO for now.  If vomiting develops then will place NGT.  Will increase IVFs to 125/hr.  PRN zofran for nausea. Patient has had nausea, vomiting and diarrhea therefore she may have an aspect of gastroenteritis as well.  Patient on Levaquin for her pneumonia at current.  Will need to monitor closely.  Dr. Derrell Lolling has seen patient and recommends conservative management with dx abdominal x-rays in am for monitoring.    2.  Lower Lobe Pneumonia: medical managing, on Levaquin  Brooklee Michelin 05/20/2012, 10:27 AM

## 2012-05-20 NOTE — Progress Notes (Signed)
CBG: 61  Treatment: D50 IV 25 mL  Symptoms: None  Follow-up CBG: Time:1805 CBG Result:121  Possible Reasons for Event: Inadequate meal intake  Comments/MD notified: Dr. Briscoe Deutscher, [place]

## 2012-05-21 ENCOUNTER — Inpatient Hospital Stay (HOSPITAL_COMMUNITY): Payer: Medicare Other

## 2012-05-21 DIAGNOSIS — D696 Thrombocytopenia, unspecified: Secondary | ICD-10-CM

## 2012-05-21 DIAGNOSIS — R6510 Systemic inflammatory response syndrome (SIRS) of non-infectious origin without acute organ dysfunction: Secondary | ICD-10-CM

## 2012-05-21 DIAGNOSIS — K56609 Unspecified intestinal obstruction, unspecified as to partial versus complete obstruction: Secondary | ICD-10-CM

## 2012-05-21 DIAGNOSIS — E1165 Type 2 diabetes mellitus with hyperglycemia: Secondary | ICD-10-CM

## 2012-05-21 LAB — CBC
HCT: 30.2 % — ABNORMAL LOW (ref 36.0–46.0)
MCHC: 32.8 g/dL (ref 30.0–36.0)
MCV: 79.9 fL (ref 78.0–100.0)
Platelets: 219 10*3/uL (ref 150–400)
RDW: 17 % — ABNORMAL HIGH (ref 11.5–15.5)
WBC: 12.5 10*3/uL — ABNORMAL HIGH (ref 4.0–10.5)

## 2012-05-21 LAB — BASIC METABOLIC PANEL
BUN: 14 mg/dL (ref 6–23)
Calcium: 8.5 mg/dL (ref 8.4–10.5)
Creatinine, Ser: 0.58 mg/dL (ref 0.50–1.10)
GFR calc Af Amer: >90 mL/min (ref >90)

## 2012-05-21 LAB — URINE CULTURE

## 2012-05-21 LAB — LIPASE, BLOOD: Lipase: 24 U/L (ref 11–59)

## 2012-05-21 LAB — GLUCOSE, CAPILLARY: Glucose-Capillary: 205 mg/dL — ABNORMAL HIGH (ref 70–99)

## 2012-05-21 MED ORDER — KETOROLAC TROMETHAMINE 15 MG/ML IJ SOLN
15.0000 mg | Freq: Four times a day (QID) | INTRAMUSCULAR | Status: DC | PRN
  Filled 2012-05-21: qty 1

## 2012-05-21 MED ORDER — AMLODIPINE BESYLATE 5 MG PO TABS
5.0000 mg | ORAL_TABLET | Freq: Every day | ORAL | Status: DC
  Administered 2012-05-21 – 2012-05-22 (×2): 5 mg via ORAL
  Filled 2012-05-21 (×3): qty 1

## 2012-05-21 MED ORDER — DEXTROSE 50 % IV SOLN
25.0000 mL | Freq: Once | INTRAVENOUS | Status: AC | PRN
  Administered 2012-05-20: 25 mL via INTRAVENOUS

## 2012-05-21 NOTE — Progress Notes (Signed)
Subjective: The patient feels well. No complaints. Had a couple of small bowel movements and passing some flatus. She denies abdominal pain or cramps. She denies heartburn, pickups, or reflux. She denies nausea. She thinks she could eat or drink without any problem.  Abdominal x-rays this morning showed all of the CT contrast has moved in the colon but there are still some soft slightly distended loops of small bowel raising the question of a partial SBO.  Laboratories WBC down to the tip 12,500. Glucose 194. Electrolytes normal.  Objective: Vital signs in last 24 hours: Temp:  [97.8 F (36.6 C)-98.2 F (36.8 C)] 98.1 F (36.7 C) (06/07 0407) Pulse Rate:  [84-98] 98  (06/07 0407) Resp:  [16-18] 18  (06/07 0407) BP: (104-146)/(66-74) 146/74 mmHg (06/07 0407) SpO2:  [91 %-95 %] 91 % (06/07 0407) Last BM Date: 05/19/12  Intake/Output from previous day: 06/06 0701 - 06/07 0700 In: 2218.8 [I.V.:1768.8; IV Piggyback:450] Out: -  Intake/Output this shift:    General appearance: the patient looks well. Mental status normal. In no distress. Skin warm and dry. GI: abdomen soft and nontender. Bowel sounds present. Perhaps slightly distended. No mass. No hernia.  Lab Results:   Basename 05/21/12 0650 05/20/12 0555  WBC 12.5* 16.5*  HGB 9.9* 10.1*  HCT 30.2* 31.1*  PLT 219 223   BMET  Basename 05/21/12 0650 05/20/12 0555  NA 135 131*  K 4.3 3.1*  CL 104 96  CO2 22 24  GLUCOSE 194* 134*  BUN 14 30*  CREATININE 0.58 0.75  CALCIUM 8.5 8.7   PT/INR No results found for this basename: LABPROT:2,INR:2 in the last 72 hours ABG No results found for this basename: PHART:2,PCO2:2,PO2:2,HCO3:2 in the last 72 hours  Studies/Results: Dg Chest 2 View  05/19/2012  *RADIOLOGY REPORT*  Clinical Data: Abdominal pain with weakness and diarrhea.  History of hypertension and diabetes.  CHEST - 2 VIEW  Comparison: 05/15/2006 radiographs.  Findings: The heart size and mediastinal contours are  normal. The lungs are clear. There is no pleural effusion or pneumothorax. No acute osseous findings are identified. There are stable postsurgical changes in the left paratracheal region.  IMPRESSION: Stable examination.  No active cardiopulmonary process.  Original Report Authenticated By: Gerrianne Scale, M.D.   Ct Abdomen Pelvis W Contrast  05/20/2012  **ADDENDUM** CREATED: 05/20/2012 07:28:02  The small bowel dilation with change in caliber is consistent with a small bowel obstruction.  This was discussed with D. York physician's assistant on 5500.  **END ADDENDUM** SIGNED BY: Almedia Balls. Constance Goltz, M.D.   05/19/2012  *RADIOLOGY REPORT*  Clinical Data: Abdominal pain over past few days with prominent which has subsided.  Diarrhea.  CT ABDOMEN AND PELVIS WITH CONTRAST  Technique:  Multidetector CT imaging of the abdomen and pelvis was performed following the standard protocol during bolus administration of intravenous contrast.  Contrast:  100 ml Omnipaque-300.  Comparison: None.  Findings: Dilated small bowel loops which are located anterior to the transverse colon (which may be related to ligamentum laxity rather than an internal hernia).  Small bowel loops are not dilated to the lower left abdomen where there is an abrupt change in caliber.  This may be related to adhesion.  Discrete mass at this level not identified (series 2 image 43).  No free fluid or free intraperitoneal air.  Fatty containing periumbilical hernia.  No bowel containing worrisome wall hernia.  Bilateral lower lobe infiltrates greater on the left.  Coronary artery calcifications.  Mitral valve  and aortic valve calcifications.  Mild cardiomegaly.  Atherosclerotic type changes of the abdominal aorta with ectasia. Atherosclerotic type changes extends into the aortic branch vessels and iliac arteries.  Prior left hip surgery with streak artifact. Right hip joint degenerative changes.  Degenerative changes most notable L5-S1.  Noncontrast filled  views of the urinary bladder unremarkable.  Post cholecystectomy.  Mild prominence intrahepatic biliary ducts felt to be related to the post cholecystectomy state.  Mildly lobulated contour of the liver without other findings to suggest cirrhosis. Probable small hepatic cyst.  No focal splenic, pancreatic or adrenal lesion.  Bilateral adrenal gland hyperplasia.  IMPRESSION: Small bowel loops are not dilated to the lower left abdomen where there is an abrupt change in caliber.  This may be related to adhesions.  Discrete mass at this level not identified (series 2 image 43).  No free fluid or free intraperitoneal air.  Bilateral lower lobe infiltrates greater on the left.  Atherosclerotic type changes as detailed above.  Adrenal gland hyperplasia with nodularity.  Slightly lobulated contour of the liver without other findings of cirrhosis.  Please see above.  Original Report Authenticated By: Fuller Canada, M.D.   Dg Abd 2 Views  05/21/2012  *RADIOLOGY REPORT*  Clinical Data: History of small bowel obstruction, abdominal discomfort  ABDOMEN - 2 VIEW  Comparison: CT abdomen pelvis of 05/19/2012  Findings: There is still some gaseous distention of small bowel, consistent with persistent partial small bowel obstruction. Contrast is noted within the nondistended colon.  No free intraperitoneal air is seen.  Surgical clips are noted in the right upper quadrant.  Pinning of prior left hip fracture is noted.  IMPRESSION: Persistent dilatation of small bowel loops consistent with partial small bowel obstruction.  No free air.  Original Report Authenticated By: Juline Patch, M.D.    Anti-infectives: Anti-infectives     Start     Dose/Rate Route Frequency Ordered Stop   05/19/12 1515   levofloxacin (LEVAQUIN) IVPB 750 mg        750 mg 100 mL/hr over 90 Minutes Intravenous Every 24 hours 05/19/12 1505            Assessment/Plan:   Partial SBO versus gastroenteritis. There is no evidence for compromise  bowel. Radiographically and clinically this is  improved, although not completely normal. Check labs tomorrow. No further xray unless symptomatic.  Will allow clear  liquid diet and see how she does.  Leukocytosis. This may be due to her bilateral lower lobe pneumonia .  Status post open cholecystectomy  Diabetes mellitus  Hypertension  History of breast cancer.   LOS: 2 days    Demyah Smyre M. Derrell Lolling, M.D., [facility] General and Minimally invasive Surgery Breast and Colorectal Surgery Office:   [phone] Pager:   [phone]  05/21/2012

## 2012-05-21 NOTE — Evaluation (Signed)
Physical Therapy Evaluation Patient Details Name: Melissa Mccullough MRN: [id number] DOB: 1928/08/13 Today's Date: 05/21/2012 Time: 4098-1191 PT Time Calculation (min): 20 min  PT Assessment / Plan / Recommendation Clinical Impression  Pt adm with PNA.  Pt lives alone and expect she will be able to return home with HHPT.    PT Assessment  Patient needs continued PT services    Follow Up Recommendations  Home health PT    Barriers to Discharge        lEquipment Recommendations  None recommended by PT    Recommendations for Other Services     Frequency Min 3X/week    Precautions / Restrictions Precautions Precautions: Fall   Pertinent Vitals/Pain N/A      Mobility  Bed Mobility Bed Mobility: Sit to Supine Sit to Supine: 4: Min assist Details for Bed Mobility Assistance: Assist to bring legs up Transfers Transfers: Sit to Stand;Stand to Sit Sit to Stand: 4: Min assist;5: Supervision;With upper extremity assist;From toilet;From chair/3-in-1;With armrests Stand to Sit: 4: Min guard Details for Transfer Assistance: Assist to come up from chair due to stiffness but able to stand from commode unassisted. Ambulation/Gait Ambulation/Gait Assistance: 4: Min guard Ambulation Distance (Feet): 75 Feet Assistive device: Straight cane Gait Pattern: Decreased step length - right;Decreased step length - left;Wide base of support    Exercises     PT Diagnosis: Difficulty walking;Generalized weakness  PT Problem List: Decreased strength;Decreased activity tolerance;Decreased balance;Decreased mobility PT Treatment Interventions: DME instruction;Gait training;Functional mobility training;Patient/family education;Therapeutic activities;Therapeutic exercise;Balance training   PT Goals Acute Rehab PT Goals PT Goal Formulation: With patient Time For Goal Achievement: 05/28/12 Potential to Achieve Goals: Good Pt will go Supine/Side to Sit: with modified independence PT Goal: Supine/Side  to Sit - Progress: Goal set today Pt will go Sit to Supine/Side: with modified independence PT Goal: Sit to Supine/Side - Progress: Goal set today Pt will go Sit to Stand: with modified independence PT Goal: Sit to Stand - Progress: Goal set today Pt will go Stand to Sit: with modified independence PT Goal: Stand to Sit - Progress: Goal set today Pt will Ambulate: 51 - 150 feet;with supervision;with least restrictive assistive device PT Goal: Ambulate - Progress: Goal set today  Visit Information  Last PT Received On: 05/21/12 Assistance Needed: +1    Subjective Data  Subjective: Pt states she is cold and ready to go back to bed. Patient Stated Goal: Return home   Prior Functioning  Home Living Lives With: Alone Available Help at Discharge: Family;Available PRN/intermittently Type of Home: House Home Access: Stairs to enter Entrance Stairs-Number of Steps: 2 small threshholds Home Layout: One level Bathroom Shower/Tub: Walk-in shower Home Adaptive Equipment: Straight cane;Walker - rolling;Shower chair with back;Built-in shower seat Prior Function Level of Independence: Independent with assistive device(s) (uses cane) Driving: Yes (only short distances) Vocation: Retired Musician: No difficulties    Cognition  Overall Cognitive Status: Appears within functional limits for tasks assessed/performed Arousal/Alertness: Awake/alert Orientation Level: Appears intact for tasks assessed Behavior During Session: [facility] for tasks performed    Extremity/Trunk Assessment Right Lower Extremity Assessment RLE ROM/Strength/Tone: Deficits RLE ROM/Strength/Tone Deficits: grossly 4/5 Left Lower Extremity Assessment LLE ROM/Strength/Tone: Deficits LLE ROM/Strength/Tone Deficits: grossly 4/5   Balance Static Standing Balance Static Standing - Balance Support: Right upper extremity supported Static Standing - Level of Assistance: 5: Stand by assistance  End of Session PT -  End of Session Equipment Utilized During Treatment: Gait belt Activity Tolerance: Patient tolerated treatment well Patient  left: in bed;with call bell/phone within reach;with bed alarm set Nurse Communication: Mobility status   Dorell Gatlin 05/21/2012, 3:20 PM  [facility] PT [phone]

## 2012-05-21 NOTE — Progress Notes (Signed)
PATIENT DETAILS Name: Melissa Mccullough Age: 78 y.o. Sex: female Date of Birth: [birth year] Admit Date: 05/19/2012 [id number] DAVID, MD, MD POA:   CONSULTS: [facility], Dr. Derrell Lolling  PROCEDURES: none  Interim history:  Melissa Mccullough is a very pleasant 78 year old white female with history of diabetes, hypertension, hypothyroidism was in her usual state of health until Monday. She reports abdominal pain located in bilateral upper quadrants but started on Monday this was followed by nausea with vomiting, subsequently evolved to diarrhea today. She describes the vomitus as nonbloody and nonbilious. She reports feeling really hot with chills like sensation yesterday. In addition also reports slight shortness of breath and mild cough with hoarseness today. Upon evaluation in the emergency room she was noted to have leukocytosis and CT abdomen pelvis revealed bilateral lower lobe pneumonia left greater than right, as well as possible SBO. CCS was consulted.  WBC 12.5<--16.5  Subjective: Feeling better today. Denies abdominal pain, n/v. Small BM last pm and this am. She is anxious to try po intake.   Objective: Vital signs in last 24 hours: Temp:  [97.8 F (36.6 C)-98.2 F (36.8 C)] 98.1 F (36.7 C) (06/07 0407) Pulse Rate:  [84-98] 98  (06/07 0407) Resp:  [16-18] 18  (06/07 0407) BP: (104-146)/(66-74) 146/74 mmHg (06/07 0407) SpO2:  [91 %-95 %] 91 % (06/07 0407) Weight change:  Last BM Date: 05/19/12  Intake/Output from previous day:  Intake/Output Summary (Last 24 hours) at 05/21/12 1156 Last data filed at 05/21/12 0800  Gross per 24 hour  Intake 2263.75 ml  Output      0 ml  Net 2263.75 ml     Physical Exam:  Gen:  Awake, alert sitting upright in bed in NAD Cardiovascular:  S1S2 RRR, no m/r/g Respiratory: scant bibasilar crackles, no wheeze or rales, no increased wob Gastrointestinal: abdomen obese, soft, NT/ND, BS+ Extremities: no c/c/e   Lab  Results:  Lab 05/21/12 0650 05/20/12 0555 05/19/12 1811  HGB 9.9* 10.1* 10.9*  HCT 30.2* 31.1* 33.1*  WBC 12.5* 16.5* 15.6*  PLT 219 223 237     Lab 05/21/12 0650 05/20/12 0555 05/19/12 1811 05/19/12 1128  NA 135 131* -- 127*  K 4.3 3.1* -- --  CL 104 96 -- 91*  CO2 22 24 -- 21  GLUCOSE 194* 134* -- 340*  BUN 14 30* -- 41*  CREATININE 0.58 0.75 0.81 0.94  CALCIUM 8.5 8.7 -- 10.0  MG -- -- -- --  PHOS -- -- -- --    Studies/Results: Dg Chest 2 View  05/19/2012  *RADIOLOGY REPORT*  Clinical Data: Abdominal pain with weakness and diarrhea.  History of hypertension and diabetes.  CHEST - 2 VIEW  Comparison: 05/15/2006 radiographs.  Findings: The heart size and mediastinal contours are normal. The lungs are clear. There is no pleural effusion or pneumothorax. No acute osseous findings are identified. There are stable postsurgical changes in the left paratracheal region.  IMPRESSION: Stable examination.  No active cardiopulmonary process.  Original Report Authenticated By: Gerrianne Scale, M.D.   Ct Abdomen Pelvis W Contrast  05/20/2012  **ADDENDUM** CREATED: 05/20/2012 07:28:02  The small bowel dilation with change in caliber is consistent with a small bowel obstruction.  This was discussed with D. York physician's assistant on 5500.  **END ADDENDUM** SIGNED BY: Almedia Balls. Constance Goltz, M.D.   05/19/2012  *RADIOLOGY REPORT*  Clinical Data: Abdominal pain over past few days with prominent which has subsided.  Diarrhea.  CT ABDOMEN AND PELVIS WITH  CONTRAST  Technique:  Multidetector CT imaging of the abdomen and pelvis was performed following the standard protocol during bolus administration of intravenous contrast.  Contrast:  100 ml Omnipaque-300.  Comparison: None.  Findings: Dilated small bowel loops which are located anterior to the transverse colon (which may be related to ligamentum laxity rather than an internal hernia).  Small bowel loops are not dilated to the lower left abdomen where there is  an abrupt change in caliber.  This may be related to adhesion.  Discrete mass at this level not identified (series 2 image 43).  No free fluid or free intraperitoneal air.  Fatty containing periumbilical hernia.  No bowel containing worrisome wall hernia.  Bilateral lower lobe infiltrates greater on the left.  Coronary artery calcifications.  Mitral valve and aortic valve calcifications.  Mild cardiomegaly.  Atherosclerotic type changes of the abdominal aorta with ectasia. Atherosclerotic type changes extends into the aortic branch vessels and iliac arteries.  Prior left hip surgery with streak artifact. Right hip joint degenerative changes.  Degenerative changes most notable L5-S1.  Noncontrast filled views of the urinary bladder unremarkable.  Post cholecystectomy.  Mild prominence intrahepatic biliary ducts felt to be related to the post cholecystectomy state.  Mildly lobulated contour of the liver without other findings to suggest cirrhosis. Probable small hepatic cyst.  No focal splenic, pancreatic or adrenal lesion.  Bilateral adrenal gland hyperplasia.  IMPRESSION: Small bowel loops are not dilated to the lower left abdomen where there is an abrupt change in caliber.  This may be related to adhesions.  Discrete mass at this level not identified (series 2 image 43).  No free fluid or free intraperitoneal air.  Bilateral lower lobe infiltrates greater on the left.  Atherosclerotic type changes as detailed above.  Adrenal gland hyperplasia with nodularity.  Slightly lobulated contour of the liver without other findings of cirrhosis.  Please see above.  Original Report Authenticated By: Fuller Canada, M.D.   Dg Abd 2 Views  05/21/2012  *RADIOLOGY REPORT*  Clinical Data: History of small bowel obstruction, abdominal discomfort  ABDOMEN - 2 VIEW  Comparison: CT abdomen pelvis of 05/19/2012  Findings: There is still some gaseous distention of small bowel, consistent with persistent partial small bowel  obstruction. Contrast is noted within the nondistended colon.  No free intraperitoneal air is seen.  Surgical clips are noted in the right upper quadrant.  Pinning of prior left hip fracture is noted.  IMPRESSION: Persistent dilatation of small bowel loops consistent with partial small bowel obstruction.  No free air.  Original Report Authenticated By: Juline Patch, M.D.    Medications: Scheduled Meds:   . ALPRAZolam  0.5 mg Oral Daily  . aspirin EC  81 mg Oral Daily  . bimatoprost  1 drop Both Eyes QHS  . brimonidine  1 drop Both Eyes BID  . brinzolamide  1 drop Right Eye BID  . cycloSPORINE  1 drop Both Eyes BID  . enoxaparin (LOVENOX) injection  40 mg Subcutaneous Q24H  . ezetimibe  10 mg Oral Daily  . insulin aspart  0-15 Units Subcutaneous TID WC  . levofloxacin (LEVAQUIN) IV  750 mg Intravenous Q24H  . levothyroxine  50 mcg Oral Daily  . multivitamin with minerals  1 tablet Oral Daily  . pantoprazole  40 mg Oral Q1200  . potassium chloride  10 mEq Intravenous Q1 Hr x 3  . DISCONTD: bimatoprost  1 drop Both Eyes QHS  . DISCONTD: brimonidine  1 drop  Both Eyes BID  . DISCONTD: brinzolamide  1 drop Right Eye BID  . DISCONTD: cycloSPORINE  1 drop Both Eyes BID  . DISCONTD: glipiZIDE  7.5 mg Oral Daily   Continuous Infusions:   . dextrose 5 % and 0.9% NaCl 1,000 mL with potassium chloride 40 mEq infusion 100 mL/hr at 05/21/12 0633  . DISCONTD: sodium chloride 0.9 % 1,000 mL with potassium chloride 40 mEq infusion 100 mL/hr at 05/20/12 1910   PRN Meds:.dextrose, dextrose, ketorolac, ondansetron (ZOFRAN) IV, ondansetron, DISCONTD:  HYDROmorphone (DILAUDID) injection, DISCONTD: traMADol Antibiotics: Anti-infectives     Start     Dose/Rate Route Frequency Ordered Stop   05/19/12 1515   levofloxacin (LEVAQUIN) IVPB 750 mg        750 mg 100 mL/hr over 90 Minutes Intravenous Every 24 hours 05/19/12 1505             Assessment/Plan:  Active Problems:  Community acquired  pneumonia  DM (diabetes mellitus)  HTN (hypertension), benign  Abdominal  pain, other specified site  1. CAP:  Levaquin started at admission, strep pneumo urinary antigen is negative. Blood cultures remain negative. WBC improving, pt afebrile, NT appearing  2. Partial SBO: CCS on board. Repeat film this am showing persistent dilated loops without free air. Vomiting has ceased. Diet advanced this am. Will KVO fluids. Continue to monitor. Appreciate surgery assistance.   3. E.Coli UTI: On levaquin for above. Await final sensitivities.   4. DM: Continue SSI for now. Resume Metformin at discharge   5. Hypertension:  Will restart Amlodipine. Monitor.   6. Glaucoma and Macular Degeneration.  Home medications continued. Dosing times adjusted per patient request.   7. Hypothyroidism: Continue Synthroid.   8. Code Status:  DNR   9. Disposition:  To home when medically stable. PT eval ordered.   DVT Prophylaxis: prophylactic Lovenox  Cordelia Pen, NP-C [facility]  pgr [phone]  Attending -I have seen and examined the patient, I agree with the assessment and plan as outlined above.  S Tashi Band    LOS: 2 days    05/21/2012, 11:56 AM

## 2012-05-21 NOTE — Progress Notes (Signed)
Pt had a hypoglycemic event. Pt cbg =56. Pt was given 25 ml of dextrose 50% IV. Pt cbg was rechecked in 15 mins and CBG=116. RN will continue to monitor pt.

## 2012-05-22 DIAGNOSIS — R6510 Systemic inflammatory response syndrome (SIRS) of non-infectious origin without acute organ dysfunction: Secondary | ICD-10-CM

## 2012-05-22 DIAGNOSIS — K56609 Unspecified intestinal obstruction, unspecified as to partial versus complete obstruction: Secondary | ICD-10-CM

## 2012-05-22 DIAGNOSIS — D696 Thrombocytopenia, unspecified: Secondary | ICD-10-CM

## 2012-05-22 DIAGNOSIS — E1165 Type 2 diabetes mellitus with hyperglycemia: Secondary | ICD-10-CM

## 2012-05-22 LAB — CBC
HCT: 31.6 % — ABNORMAL LOW (ref 36.0–46.0)
MCH: 25.3 pg — ABNORMAL LOW (ref 26.0–34.0)
MCV: 78.4 fL (ref 78.0–100.0)
Platelets: 243 10*3/uL (ref 150–400)
RBC: 4.03 MIL/uL (ref 3.87–5.11)

## 2012-05-22 LAB — BASIC METABOLIC PANEL
BUN: 7 mg/dL (ref 6–23)
Calcium: 8.7 mg/dL (ref 8.4–10.5)
Creatinine, Ser: 0.53 mg/dL (ref 0.50–1.10)
GFR calc non Af Amer: 87 mL/min — ABNORMAL LOW (ref >90)
Glucose, Bld: 234 mg/dL — ABNORMAL HIGH (ref 70–99)
Sodium: 133 mEq/L — ABNORMAL LOW (ref 135–145)

## 2012-05-22 LAB — DIFFERENTIAL
Eosinophils Absolute: 0.1 10*3/uL (ref 0.0–0.7)
Eosinophils Relative: 1 % (ref 0–5)
Lymphs Abs: 1.3 10*3/uL (ref 0.7–4.0)
Monocytes Absolute: 1.3 10*3/uL — ABNORMAL HIGH (ref 0.1–1.0)
Monocytes Relative: 10 % (ref 3–12)

## 2012-05-22 MED ORDER — POLYVINYL ALCOHOL 1.4 % OP SOLN
1.0000 [drp] | OPHTHALMIC | Status: DC | PRN
  Filled 2012-05-22: qty 15

## 2012-05-22 MED ORDER — POLYVINYL ALCOHOL 1.4 % OP SOLN: 1.0000 [drp] | OPHTHALMIC | Status: AC | PRN

## 2012-05-22 MED ORDER — LEVOFLOXACIN 750 MG PO TABS: 750.0000 mg | ORAL_TABLET | Freq: Every day | ORAL | Status: AC

## 2012-05-22 NOTE — Progress Notes (Signed)
Patient to be discharged home with husband. IV site removed needle intact. Discharge instructions reviewed with patient and family. Hines Kloss,RN.

## 2012-05-22 NOTE — Discharge Summary (Signed)
PATIENT DETAILS Name: Melissa Mccullough Age: 78 y.o. Sex: female Date of Birth: Aug 26, 1928 MRN: [id number]. Admit Date: 05/19/2012 Admitting Physician: Zannie Cove, MD WGN:[id number],MVHQION DAVID, MD, MD  PRIMARY DISCHARGE DIAGNOSIS:  Active Problems:  Community acquired pneumonia  Small Bowel Obstruction  DM (diabetes mellitus)  HTN (hypertension), benign  Abdominal  pain, other specified site      PAST MEDICAL HISTORY: Past Medical History  Diagnosis Date  . Hypertension   . Hypothyroidism   . Diabetes mellitus     DISCHARGE MEDICATIONS: Medication List  As of 05/22/2012  5:03 PM   TAKE these medications         ALPRAZolam 0.5 MG tablet   Commonly known as: XANAX   Take 0.5 mg by mouth daily.      amLODipine 5 MG tablet   Commonly known as: NORVASC   Take 5 mg by mouth daily.      aspirin EC 81 MG tablet   Take 81 mg by mouth daily.      bimatoprost 0.01 % Soln   Commonly known as: LUMIGAN   Place 1 drop into both eyes at bedtime.      brimonidine 0.1 % Soln   Commonly known as: ALPHAGAN P   Place 1 drop into the right eye 2 (two) times daily.      brinzolamide 1 % ophthalmic suspension   Commonly known as: AZOPT   Place 1 drop into the right eye 2 (two) times daily.      CALCIUM-MAGNESIUM-ZINC PO   Take 1 tablet by mouth daily.      cycloSPORINE 0.05 % ophthalmic emulsion   Commonly known as: RESTASIS   Place 1 drop into both eyes 2 (two) times daily.      ezetimibe 10 MG tablet   Commonly known as: ZETIA   Take 10 mg by mouth daily.      glipiZIDE 5 MG tablet   Commonly known as: GLUCOTROL   Take 7.5 mg by mouth daily.      hydrochlorothiazide 25 MG tablet   Commonly known as: HYDRODIURIL   Take 25 mg by mouth daily.      levofloxacin 750 MG tablet   Commonly known as: LEVAQUIN   Take 1 tablet (750 mg total) by mouth daily.      levothyroxine 50 MCG tablet   Commonly known as: SYNTHROID, LEVOTHROID   Take 50 mcg by mouth daily.     lisinopril 10 MG tablet   Commonly known as: PRINIVIL,ZESTRIL   Take 10 mg by mouth daily.      meclizine 25 MG tablet   Commonly known as: ANTIVERT   Take 25 mg by mouth every 4 (four) hours as needed. For vertigo      metFORMIN 500 MG (MOD) 24 hr tablet   Commonly known as: GLUMETZA   Take 500-1,000 mg by mouth daily with breakfast. 1 tablet in the morning   2 tablets at night      multivitamin with minerals Tabs   Take 1 tablet by mouth daily.      nitrofurantoin 100 MG capsule   Commonly known as: MACRODANTIN   Take 100 mg by mouth 2 (two) times daily.      omega-3 acid ethyl esters 1 G capsule   Commonly known as: LOVAZA   Take 1 g by mouth daily.      PERFECT IRON PO   Take 1 tablet by mouth daily.      polyvinyl alcohol  1.4 % ophthalmic solution   Commonly known as: LIQUIFILM TEARS   Place 1 drop into both eyes as needed.      traMADol 50 MG tablet   Commonly known as: ULTRAM   Take 50 mg by mouth 4 (four) times daily as needed. For pain      vitamin E 400 UNIT capsule   Take 400 Units by mouth daily.             BRIEF HPI:  See H&P, Labs, Consult and Test reports for all details in brief, [name] is a very pleasant 78 year old white female with history of diabetes, hypertension, hypothyroidism was in her usual state of health until Monday.  She reports abdominal pain located in bilateral upper quadrants but started on Monday this was followed by nausea with vomiting, subsequently evolved to diarrhea today. She describes the vomitus as nonbloody and nonbilious. She reports feeling really hot with chills like sensation yesterday. She denies any recent antibiotic use. In addition also reports slight shortness of breath and mild cough with hoarseness today.  Upon evaluation in the emergency room she was noted to have leukocytosis and CT abdomen pelvis revealed bilateral lower lobe pneumonia left greater than right and a possible PSBO as well. She was then  admitted for further evaluation and treatment.  CONSULTATIONS:   general surgery  PERTINENT RADIOLOGIC STUDIES: Dg Chest 2 View  05/19/2012  *RADIOLOGY REPORT*  Clinical Data: Abdominal pain with weakness and diarrhea.  History of hypertension and diabetes.  CHEST - 2 VIEW  Comparison: 05/15/2006 radiographs.  Findings: The heart size and mediastinal contours are normal. The lungs are clear. There is no pleural effusion or pneumothorax. No acute osseous findings are identified. There are stable postsurgical changes in the left paratracheal region.  IMPRESSION: Stable examination.  No active cardiopulmonary process.  Original Report Authenticated By: Gerrianne Scale, M.D.   Ct Abdomen Pelvis W Contrast  05/20/2012  **ADDENDUM** CREATED: 05/20/2012 07:28:02  The small bowel dilation with change in caliber is consistent with a small bowel obstruction.  This was discussed with D. York physician's assistant on 5500.  **END ADDENDUM** SIGNED BY: Almedia Balls. Constance Goltz, M.D.   05/19/2012  *RADIOLOGY REPORT*  Clinical Data: Abdominal pain over past few days with prominent which has subsided.  Diarrhea.  CT ABDOMEN AND PELVIS WITH CONTRAST  Technique:  Multidetector CT imaging of the abdomen and pelvis was performed following the standard protocol during bolus administration of intravenous contrast.  Contrast:  100 ml Omnipaque-300.  Comparison: None.  Findings: Dilated small bowel loops which are located anterior to the transverse colon (which may be related to ligamentum laxity rather than an internal hernia).  Small bowel loops are not dilated to the lower left abdomen where there is an abrupt change in caliber.  This may be related to adhesion.  Discrete mass at this level not identified (series 2 image 43).  No free fluid or free intraperitoneal air.  Fatty containing periumbilical hernia.  No bowel containing worrisome wall hernia.  Bilateral lower lobe infiltrates greater on the left.  Coronary artery calcifications.   Mitral valve and aortic valve calcifications.  Mild cardiomegaly.  Atherosclerotic type changes of the abdominal aorta with ectasia. Atherosclerotic type changes extends into the aortic branch vessels and iliac arteries.  Prior left hip surgery with streak artifact. Right hip joint degenerative changes.  Degenerative changes most notable L5-S1.  Noncontrast filled views of the urinary bladder unremarkable.  Post cholecystectomy.  Mild prominence  intrahepatic biliary ducts felt to be related to the post cholecystectomy state.  Mildly lobulated contour of the liver without other findings to suggest cirrhosis. Probable small hepatic cyst.  No focal splenic, pancreatic or adrenal lesion.  Bilateral adrenal gland hyperplasia.  IMPRESSION: Small bowel loops are not dilated to the lower left abdomen where there is an abrupt change in caliber.  This may be related to adhesions.  Discrete mass at this level not identified (series 2 image 43).  No free fluid or free intraperitoneal air.  Bilateral lower lobe infiltrates greater on the left.  Atherosclerotic type changes as detailed above.  Adrenal gland hyperplasia with nodularity.  Slightly lobulated contour of the liver without other findings of cirrhosis.  Please see above.  Original Report Authenticated By: Fuller Canada, M.D.   Dg Abd 2 Views  05/21/2012  *RADIOLOGY REPORT*  Clinical Data: History of small bowel obstruction, abdominal discomfort  ABDOMEN - 2 VIEW  Comparison: CT abdomen pelvis of 05/19/2012  Findings: There is still some gaseous distention of small bowel, consistent with persistent partial small bowel obstruction. Contrast is noted within the nondistended colon.  No free intraperitoneal air is seen.  Surgical clips are noted in the right upper quadrant.  Pinning of prior left hip fracture is noted.  IMPRESSION: Persistent dilatation of small bowel loops consistent with partial small bowel obstruction.  No free air.  Original Report Authenticated By:  Juline Patch, M.D.     PERTINENT LAB RESULTS: CBC:  Basename 05/22/12 0640 05/21/12 0650  WBC 12.7* 12.5*  HGB 10.2* 9.9*  HCT 31.6* 30.2*  PLT 243 219   CMET CMP     Component Value Date/Time   NA 133* 05/22/2012 0640   K 4.0 05/22/2012 0640   CL 101 05/22/2012 0640   CO2 23 05/22/2012 0640   GLUCOSE 234* 05/22/2012 0640   BUN 7 05/22/2012 0640   CREATININE 0.53 05/22/2012 0640   CALCIUM 8.7 05/22/2012 0640   PROT 5.8* 05/20/2012 0555   ALBUMIN 2.5* 05/20/2012 0555   AST 41* 05/20/2012 0555   ALT 34 05/20/2012 0555   ALKPHOS 102 05/20/2012 0555   BILITOT 0.4 05/20/2012 0555   GFRNONAA 87* 05/22/2012 0640   GFRAA >90 05/22/2012 0640    GFR Estimated Creatinine Clearance: 52.2 ml/min (by C-G formula based on Cr of 0.53).  Basename 05/21/12 0650  LIPASE 24  AMYLASE --    Basename 05/19/12 2045  CKTOTAL --  CKMB --  CKMBINDEX --  TROPONINI <0.30   No components found with this basename: POCBNP:3 No results found for this basename: DDIMER:2 in the last 72 hours No results found for this basename: HGBA1C:2 in the last 72 hours No results found for this basename: CHOL:2,HDL:2,LDLCALC:2,TRIG:2,CHOLHDL:2,LDLDIRECT:2 in the last 72 hours No results found for this basename: TSH,T4TOTAL,FREET3,T3FREE,THYROIDAB in the last 72 hours No results found for this basename: VITAMINB12:2,FOLATE:2,FERRITIN:2,TIBC:2,IRON:2,RETICCTPCT:2 in the last 72 hours Coags: No results found for this basename: PT:2,INR:2 in the last 72 hours Microbiology: Recent Results (from the past 240 hour(s))  URINE CULTURE     Status: Normal   Collection Time   05/19/12  2:17 PM      Component Value Range Status Comment   Specimen Description URINE, RANDOM   Final    Special Requests CX ADDED AT 2159   Final    Culture  Setup Time [id number]   Final    Colony Count >=100,000 COLONIES/ML   Final    Culture ESCHERICHIA COLI  Final    Report Status 05/21/2012 FINAL   Final    Organism ID, Bacteria ESCHERICHIA COLI   Final     CULTURE, BLOOD (ROUTINE X 2)     Status: Normal (Preliminary result)   Collection Time   05/19/12  3:15 PM      Component Value Range Status Comment   Specimen Description BLOOD ARM LEFT   Final    Special Requests BOTTLES DRAWN AEROBIC AND ANAEROBIC 10CC   Final    Culture  Setup Time [id number]   Final    Culture     Final    Value:        BLOOD CULTURE RECEIVED NO GROWTH TO DATE CULTURE WILL BE HELD FOR 5 DAYS BEFORE ISSUING A FINAL NEGATIVE REPORT   Report Status PENDING   Incomplete   CULTURE, BLOOD (ROUTINE X 2)     Status: Normal (Preliminary result)   Collection Time   05/19/12  3:30 PM      Component Value Range Status Comment   Specimen Description BLOOD ARM LEFT   Final    Special Requests BOTTLES DRAWN AEROBIC AND ANAEROBIC 10CC   Final    Culture  Setup Time [id number]   Final    Culture     Final    Value:        BLOOD CULTURE RECEIVED NO GROWTH TO DATE CULTURE WILL BE HELD FOR 5 DAYS BEFORE ISSUING A FINAL NEGATIVE REPORT   Report Status PENDING   Incomplete      BRIEF HOSPITAL COURSE:   Active Problems:  1. CAP:  -Levaquin started at admission, strep pneumo urinary antigen is negative. Blood cultures remain negative to date. WBC improving, pt afebrile, NT appearing.She will be transitioned to oral Levaquin on discharge, she will need a follow up CXR in 6-8 weeks to document resolution of the PNA.  2.Partial SBO:  -on admission she was complaining of abd pain along with nausea,vomiting and diarrhea. A CT abdomen-did reveal small bowel obstruction. She was then placed on supportive care, kept NPO and surgery was consulted. With these conservative measures, she improved rather rapidly, she did have 2 BM's today, her diet was advanced,she has now tolerated a regular diet. She now is insisting on being discharged home today, her 2 son's are at bedside, and will be able to keep an eye on her for the next few days. She will be discharged home at her own request.  3. B/L eye  redness/burning sensation/mild photophobia -this was discussed with Dr Deretha Emory on call-patient's prior h/o of Glaucoma/Macular Degeneration discussed, current eye medications discussed, admitting history-PNA/SBO was also discussed. Dr Luciana Axe felt that since both the eyes were involved-he thought that she was likely having a allergic reaction to Alphagan or a viral conjunctivitis. He felt the chances of being a Glaucoma flare was minimal.He recommended stopping the generic Alphagan that patient got here in the hospital. Per patient there is no visual impairment at all.  4. DM: -Resume Metformin at discharge   5.Hypertension:  Controlled with Amlodipine.   6. Glaucoma and Macular Degeneration.  Home medications continued. Dosing times adjusted per patient request. Please see above.  TODAY-DAY OF DISCHARGE:  Subjective:   Kenyotta Propst today has no headache,no chest abdominal pain,no new weakness tingling or numbness, feels much better wants to go home today. She has been insisting the whole day to go home, and this evening she has tolerated a regular diet and is very very anxious to go  home.  Objective:   Blood pressure 129/70, pulse 108, temperature 97.7 F (36.5 C), temperature source Oral, resp. rate 18, height 5\' 2"  (1.575 m), weight 74.7 kg (164 lb 10.9 oz), SpO2 94.00%.  Intake/Output Summary (Last 24 hours) at 05/22/12 1703 Last data filed at 05/22/12 1300  Gross per 24 hour  Intake 1328.33 ml  Output    300 ml  Net 1028.33 ml    Exam Awake Alert, Oriented *3, No new F.N deficits, Normal affect [place].AT,PERRAL Supple Neck,No JVD, No cervical lymphadenopathy appriciated.  Symmetrical Chest wall movement, Good air movement bilaterally, CTAB RRR,No Gallops,Rubs or new Murmurs, No Parasternal Heave +ve B.Sounds, Abd Soft, Non tender, No organomegaly appriciated, No rebound -guarding or rigidity. No Cyanosis, Clubbing or edema, No new Rash or  bruise  DISPOSITION: Home with home health services at patient's own request  DISCHARGE INSTRUCTIONS:    Follow-up Information    Follow up with Nadean Corwin, MD. Schedule an appointment as soon as possible for a visit in 1 week.   Contact information:   [address] [place] [address] [phone]       Schedule an appointment as soon as possible for a visit in 2 days to follow up. [facility], Francene Boyers, O.D., , N.[place].)         Total Time spent on discharge equals 45 minutes.  SignedJeoffrey Massed 05/22/2012 5:03 PM

## 2012-05-22 NOTE — Progress Notes (Signed)
Physical Therapy Treatment Patient Details Name: Melissa Mccullough MRN: [id number] DOB: 1928/10/10 Today's Date: 05/22/2012 Time: 6962-9528 PT Time Calculation (min): 24 min  PT Assessment / Plan / Recommendation Comments on Treatment Session  Pt making progress however would benefit from as much supervision with gait when at home as possible due to her instability.     Follow Up Recommendations  Home health PT       Equipment Recommendations  None recommended by PT       Frequency Min 3X/week   Plan Discharge plan remains appropriate    Precautions / Restrictions Precautions Precautions: Fall       Mobility  Bed Mobility Bed Mobility:  (seated at start) Transfers Transfers: Sit to Stand;Stand to Sit Sit to Stand: 5: Supervision;From chair/3-in-1;With armrests;With upper extremity assist Stand to Sit: 5: Supervision Details for Transfer Assistance: Pt able to stand from chair and 3n1 with supervision. Pt reports she has3n1 over toilet at home.  Ambulation/Gait Ambulation/Gait Assistance: 5: Supervision;4: Min assist Assistive device: Straight cane Ambulation/Gait Assistance Details: Min assist for 2 larger losses of balance, one with side stepping and one with a turn. Pt generally is unstable. Educated pt on being very cautious with return home, using 3n1 next to bed at night and to have family over to help as much as possible during first two weeks home. Gait Pattern: Decreased step length - right;Decreased step length - left;Wide base of support Stairs: No          PT Goals Acute Rehab PT Goals PT Goal Formulation: With patient Time For Goal Achievement: 05/28/12 Potential to Achieve Goals: Good Pt will go Sit to Stand: with modified independence PT Goal: Sit to Stand - Progress: Progressing toward goal Pt will go Stand to Sit: with modified independence PT Goal: Stand to Sit - Progress: Progressing toward goal Pt will Ambulate: 51 - 150 feet;with supervision;with  least restrictive assistive device PT Goal: Ambulate - Progress: Progressing toward goal  Visit Information  Last PT Received On: 05/22/12 Assistance Needed: +1          Balance  Static Standing Balance Static Standing - Balance Support: Right upper extremity supported Static Standing - Level of Assistance: 5: Stand by assistance High Level Balance High Level Balance Activites: Side stepping;Backward walking;Direction changes;Turns;Sudden stops;Head turns High Level Balance Comments: Pt needs up to min assist for high level balance activities  End of Session PT - End of Session Equipment Utilized During Treatment: Gait belt Activity Tolerance: Patient tolerated treatment well Patient left: with call bell/phone within reach;in chair Nurse Communication: Mobility status    Wilhemina Bonito 05/22/2012, 5:31 PM  Sherie Don) Carleene Mains PT, DPT Acute Rehabilitation [phone]

## 2012-05-22 NOTE — Progress Notes (Signed)
<principal problem not specified>  Subjective: She says she is feeling better. Not having much abdominal pain. Continues to pass gas but has not had a bowel movement since yesterday. No nausea. Tolerating liquids. Wants to go home.  Objective: Vital signs in last 24 hours: Temp:  [97.6 F (36.4 C)-98.9 F (37.2 C)] 98.9 F (37.2 C) (06/08 0523) Pulse Rate:  [79-102] 79  (06/08 0523) Resp:  [18] 18  (06/08 0523) BP: (135-169)/(59-81) 140/59 mmHg (06/08 0523) SpO2:  [93 %-95 %] 93 % (06/08 0523) Weight:  [164 lb 10.9 oz (74.7 kg)] 164 lb 10.9 oz (74.7 kg) (06/08 0719) Last BM Date: 05/20/12  Intake/Output from previous day: 06/07 0701 - 06/08 0700 In: 1575.3 [P.O.:480; I.V.:945.3; IV Piggyback:150] Out: -  Intake/Output this shift:    General appearance: alert, cooperative, appears stated age and no distress Resp: clear to auscultation bilaterally GI: aabdomen seems slightly distended but soft and nontender. No rebound. Bowel sounds are present.  Lab Results:  Results for orders placed during the hospital encounter of 05/19/12 (from the past 24 hour(s))  GLUCOSE, CAPILLARY     Status: Abnormal   Collection Time   05/21/12 11:30 AM      Component Value Range   Glucose-Capillary 173 (*) 70 - 99 (mg/dL)  GLUCOSE, CAPILLARY     Status: Abnormal   Collection Time   05/21/12  4:35 PM      Component Value Range   Glucose-Capillary 202 (*) 70 - 99 (mg/dL)  GLUCOSE, CAPILLARY     Status: Abnormal   Collection Time   05/21/12  9:37 PM      Component Value Range   Glucose-Capillary 151 (*) 70 - 99 (mg/dL)  CBC     Status: Abnormal   Collection Time   05/22/12  6:40 AM      Component Value Range   WBC 12.7 (*) 4.0 - 10.5 (K/uL)   RBC 4.03  3.87 - 5.11 (MIL/uL)   Hemoglobin 10.2 (*) 12.0 - 15.0 (g/dL)   HCT 40.9 (*) 81.1 - 46.0 (%)   MCV 78.4  78.0 - 100.0 (fL)   MCH 25.3 (*) 26.0 - 34.0 (pg)   MCHC 32.3  30.0 - 36.0 (g/dL)   RDW 91.4 (*) 78.2 - 15.5 (%)   Platelets 243  150 - 400  (K/uL)  DIFFERENTIAL     Status: Abnormal   Collection Time   05/22/12  6:40 AM      Component Value Range   Neutrophils Relative 78 (*) 43 - 77 (%)   Neutro Abs 9.9 (*) 1.7 - 7.7 (K/uL)   Lymphocytes Relative 11 (*) 12 - 46 (%)   Lymphs Abs 1.3  0.7 - 4.0 (K/uL)   Monocytes Relative 10  3 - 12 (%)   Monocytes Absolute 1.3 (*) 0.1 - 1.0 (K/uL)   Eosinophils Relative 1  0 - 5 (%)   Eosinophils Absolute 0.1  0.0 - 0.7 (K/uL)   Basophils Relative 0  0 - 1 (%)   Basophils Absolute 0.0  0.0 - 0.1 (K/uL)     Studies/Results Radiology     MEDS, Scheduled    . ALPRAZolam  0.5 mg Oral Daily  . amLODipine  5 mg Oral Daily  . aspirin EC  81 mg Oral Daily  . bimatoprost  1 drop Both Eyes QHS  . brimonidine  1 drop Both Eyes BID  . brinzolamide  1 drop Right Eye BID  . cycloSPORINE  1 drop Both Eyes  BID  . enoxaparin (LOVENOX) injection  40 mg Subcutaneous Q24H  . ezetimibe  10 mg Oral Daily  . insulin aspart  0-15 Units Subcutaneous TID WC  . levofloxacin (LEVAQUIN) IV  750 mg Intravenous Q24H  . levothyroxine  50 mcg Oral Daily  . multivitamin with minerals  1 tablet Oral Daily  . pantoprazole  40 mg Oral Q1200     Assessment: <principal problem not specified> Abdominal pain, likely ileus, improving. She had some hypokalemia on admission which now appears resolved. I think she can gradually advance diet  Plan: Recommend advancing diet as tolerated. We will follow one more day if she does not go home later today.  LOS: 3 days    Currie Paris, MD, [facility], [place] [phone]   05/22/2012 8:05 AM

## 2012-05-22 NOTE — Progress Notes (Signed)
   CARE MANAGEMENT NOTE 05/22/2012  Patient:  CLEMENTINA, MARENO   Account Number:  [id number]  Date Initiated:  05/20/2012  Documentation initiated by:  Letha Cape  Subjective/Objective Assessment:   dx pna, abd pain  admit- lives alone.     Action/Plan:   pt eval   Anticipated DC Date:  05/23/2012   Anticipated DC Plan:  HOME W HOME HEALTH SERVICES      DC Planning Services  CM consult      [facility] Choice  HOME HEALTH   Choice offered to / List presented to:  C-1 Patient        HH arranged  HH-2 PT      [facility] agency  [facility]   Status of service:  Completed, signed off Medicare Important Message given?   (If response is "NO", the following Medicare IM given date fields will be blank) Date Medicare IM given:   Date Additional Medicare IM given:    Discharge Disposition:  HOME W HOME HEALTH SERVICES  Per UR Regulation:  Reviewed for med. necessity/level of care/duration of stay  If discussed at Long Length of Stay Meetings, dates discussed:    Comments:  05/22/2012 1700 Spoke to pt and states she had [facility] in the past. Requested [facility] for [facility]. Contacted [facility] for [facility] for scheduled d/c today. Isidoro Donning RN CCM Case Mgmt phone [phone]  05/20/12 16:02 Letha Cape RN, BSN [phone] patient lives alone, awaiting pt eval.  Patient has medication coverage and transportation.  Her son [name] takes her to her MD appts.  NCM will continue to follow for dc needs.

## 2012-05-22 NOTE — Progress Notes (Signed)
PATIENT DETAILS Name: Melissa Mccullough Age: 78 y.o. Sex: female Date of Birth: 1928/06/17 Admit Date: 05/19/2012 ZOX:[id number] DAVID, MD, MD POA:   CONSULTS: [facility], Dr. Derrell Lolling  PROCEDURES: none  Interim history:  [name] is a very pleasant 78 year old white female with history of diabetes, hypertension, hypothyroidism was in her usual state of health until Monday. She reports abdominal pain located in bilateral upper quadrants but started on Monday this was followed by nausea with vomiting, subsequently evolved to diarrhea today. She describes the vomitus as nonbloody and nonbilious. She reports feeling really hot with chills like sensation yesterday. In addition also reports slight shortness of breath and mild cough with hoarseness today. Upon evaluation in the emergency room she was noted to have leukocytosis and CT abdomen pelvis revealed bilateral lower lobe pneumonia left greater than right, as well as possible SBO. CCS was consulted.  WBC <12.7--12.5<--16.5  Subjective: No abd pain-had BM yesterday  Objective: Vital signs in last 24 hours: Temp:  [97.6 F (36.4 C)-98.9 F (37.2 C)] 98.9 F (37.2 C) (06/08 0523) Pulse Rate:  [79-102] 79  (06/08 0523) Resp:  [18] 18  (06/08 0523) BP: (135-169)/(59-81) 140/59 mmHg (06/08 0523) SpO2:  [93 %-95 %] 93 % (06/08 0523) Weight:  [74.7 kg (164 lb 10.9 oz)] 74.7 kg (164 lb 10.9 oz) (06/08 0719) Weight change:  Last BM Date: 2012-06-08  Intake/Output from previous day:  Intake/Output Summary (Last 24 hours) at 05/22/12 1229 Last data filed at 05/22/12 0845  Gross per 24 hour  Intake 1478.33 ml  Output    300 ml  Net 1178.33 ml     Physical Exam:  Gen:  Awake, alert sitting upright in bed in NAD Cardiovascular:  S1S2 RRR, no m/r/g Respiratory: scant bibasilar crackles, no wheeze or rales, no increased wob Gastrointestinal: abdomen obese, soft, NT/ND, BS+ Extremities: no c/c/e   Lab  Results:  Lab 05/22/12 0640 06-08-2012 0650 05/20/12 0555  HGB 10.2* 9.9* 10.1*  HCT 31.6* 30.2* 31.1*  WBC 12.7* 12.5* 16.5*  PLT 243 219 223     Lab 05/22/12 0640 2012/06/08 0650 05/20/12 0555 05/19/12 1811 05/19/12 1128  NA 133* 135 131* -- 127*  K 4.0 4.3 -- -- --  CL 101 104 96 -- 91*  CO2 23 22 24  -- 21  GLUCOSE 234* 194* 134* -- 340*  BUN 7 14 30* -- 41*  CREATININE 0.53 0.58 0.75 0.81 0.94  CALCIUM 8.7 8.5 8.7 -- 10.0  MG -- -- -- -- --  PHOS -- -- -- -- --    Studies/Results: Dg Abd 2 Views  06/08/2012  *RADIOLOGY REPORT*  Clinical Data: History of small bowel obstruction, abdominal discomfort  ABDOMEN - 2 VIEW  Comparison: CT abdomen pelvis of 05/19/2012  Findings: There is still some gaseous distention of small bowel, consistent with persistent partial small bowel obstruction. Contrast is noted within the nondistended colon.  No free intraperitoneal air is seen.  Surgical clips are noted in the right upper quadrant.  Pinning of prior left hip fracture is noted.  IMPRESSION: Persistent dilatation of small bowel loops consistent with partial small bowel obstruction.  No free air.  Original Report Authenticated By: Juline Patch, M.D.    Medications: Scheduled Meds:    . ALPRAZolam  0.5 mg Oral Daily  . amLODipine  5 mg Oral Daily  . aspirin EC  81 mg Oral Daily  . bimatoprost  1 drop Both Eyes QHS  . brimonidine  1 drop  Both Eyes BID  . brinzolamide  1 drop Right Eye BID  . cycloSPORINE  1 drop Both Eyes BID  . enoxaparin (LOVENOX) injection  40 mg Subcutaneous Q24H  . ezetimibe  10 mg Oral Daily  . insulin aspart  0-15 Units Subcutaneous TID WC  . levofloxacin (LEVAQUIN) IV  750 mg Intravenous Q24H  . levothyroxine  50 mcg Oral Daily  . multivitamin with minerals  1 tablet Oral Daily  . pantoprazole  40 mg Oral Q1200   Continuous Infusions:    . dextrose 5 % and 0.9% NaCl 1,000 mL with potassium chloride 40 mEq infusion 20 mL/hr at 05/22/12 0621   PRN  Meds:.ketorolac, ondansetron (ZOFRAN) IV, ondansetron Antibiotics: Anti-infectives     Start     Dose/Rate Route Frequency Ordered Stop   05/19/12 1515   levofloxacin (LEVAQUIN) IVPB 750 mg        750 mg 100 mL/hr over 90 Minutes Intravenous Every 24 hours 05/19/12 1505             Assessment/Plan:  Active Problems:  Community acquired pneumonia  DM (diabetes mellitus)  HTN (hypertension), benign  Abdominal  pain, other specified site  1. CAP:  Levaquin started at admission, strep pneumo urinary antigen is negative. Blood cultures remain negative. WBC improving, pt afebrile, NT appearing  2. Partial SBO: CCS on board. Repeat film this am showing persistent dilated loops without free air. Vomiting has ceased. Diet advanced to full liquids. Will KVO fluids. Continue to monitor. Appreciate surgery assistance.   3. E.Coli UTI: On levaquin for above. Sensitivitie to Levaquin  4. DM: Continue SSI for now. Resume Metformin at discharge   5. Hypertension:  Controlled with Amlodipine.   6. Glaucoma and Macular Degeneration.  Home medications continued. Dosing times adjusted per patient request.   7. Hypothyroidism: Continue Synthroid.   8. Code Status:  DNR   9. Disposition:  To home when medically stable-likely 6/9  DVT Prophylaxis: prophylactic Lovenox  Cordelia Pen, NP-C [facility]  pgr [phone]  Attending -I have seen and examined the patient, I agree with the assessment and plan as outlined above.  S Latorya Bautch    LOS: 3 days    05/22/2012, 12:29 PM

## 2012-05-26 LAB — CULTURE, BLOOD (ROUTINE X 2)
Culture  Setup Time: 201306060141
Culture: NO GROWTH

## 2012-11-22 ENCOUNTER — Other Ambulatory Visit (HOSPITAL_COMMUNITY): Payer: Self-pay | Admitting: Internal Medicine

## 2012-11-22 DIAGNOSIS — Z139 Encounter for screening, unspecified: Secondary | ICD-10-CM

## 2012-12-29 ENCOUNTER — Other Ambulatory Visit (HOSPITAL_COMMUNITY): Payer: Medicare Other

## 2012-12-30 ENCOUNTER — Other Ambulatory Visit (HOSPITAL_COMMUNITY): Payer: Medicare Other

## 2012-12-31 ENCOUNTER — Ambulatory Visit (HOSPITAL_COMMUNITY)
Admission: RE | Admit: 2012-12-31 | Discharge: 2012-12-31 | Disposition: A | Discharge status: Home / Self Care | Payer: Medicare Other | Source: Ambulatory Visit | Attending: Internal Medicine | Admitting: Internal Medicine

## 2012-12-31 ENCOUNTER — Other Ambulatory Visit (HOSPITAL_COMMUNITY): Payer: Self-pay | Admitting: Internal Medicine

## 2012-12-31 DIAGNOSIS — I517 Cardiomegaly: Secondary | ICD-10-CM | POA: Insufficient documentation

## 2012-12-31 DIAGNOSIS — Z780 Asymptomatic menopausal state: Secondary | ICD-10-CM | POA: Insufficient documentation

## 2012-12-31 DIAGNOSIS — Z13820 Encounter for screening for osteoporosis: Secondary | ICD-10-CM | POA: Insufficient documentation

## 2012-12-31 DIAGNOSIS — I77819 Aortic ectasia, unspecified site: Secondary | ICD-10-CM | POA: Insufficient documentation

## 2012-12-31 DIAGNOSIS — Z139 Encounter for screening, unspecified: Secondary | ICD-10-CM

## 2012-12-31 DIAGNOSIS — J189 Pneumonia, unspecified organism: Secondary | ICD-10-CM | POA: Insufficient documentation

## 2012-12-31 LAB — HM DEXA SCAN

## 2013-10-18 ENCOUNTER — Telehealth: Payer: Self-pay | Admitting: Internal Medicine

## 2013-10-18 MED ORDER — TRAMADOL HCL 50 MG PO TABS: 50.0000 mg | ORAL_TABLET | Freq: Four times a day (QID) | ORAL | Status: DC | PRN

## 2013-10-18 MED ORDER — LISINOPRIL 10 MG PO TABS: 10.0000 mg | ORAL_TABLET | Freq: Every day | ORAL | Status: DC

## 2013-10-18 NOTE — Telephone Encounter (Signed)
REFILL ON LISINOPRIL 10MG  QTY 90  AND TRAMADOL HCL 50MG  QTY 90

## 2013-11-22 ENCOUNTER — Encounter: Payer: Self-pay | Admitting: Internal Medicine

## 2013-11-22 DIAGNOSIS — G629 Polyneuropathy, unspecified: Secondary | ICD-10-CM | POA: Insufficient documentation

## 2013-11-22 DIAGNOSIS — E1129 Type 2 diabetes mellitus with other diabetic kidney complication: Secondary | ICD-10-CM | POA: Insufficient documentation

## 2013-11-22 DIAGNOSIS — H409 Unspecified glaucoma: Secondary | ICD-10-CM | POA: Insufficient documentation

## 2013-11-22 DIAGNOSIS — E785 Hyperlipidemia, unspecified: Secondary | ICD-10-CM | POA: Insufficient documentation

## 2013-11-22 DIAGNOSIS — D649 Anemia, unspecified: Secondary | ICD-10-CM | POA: Insufficient documentation

## 2013-11-22 DIAGNOSIS — E039 Hypothyroidism, unspecified: Secondary | ICD-10-CM | POA: Insufficient documentation

## 2013-11-23 ENCOUNTER — Encounter: Payer: Self-pay | Admitting: Physician Assistant

## 2013-11-23 ENCOUNTER — Ambulatory Visit (INDEPENDENT_AMBULATORY_CARE_PROVIDER_SITE_OTHER): Payer: Medicare Other | Admitting: Physician Assistant

## 2013-11-23 VITALS — BP 132/62 | HR 76 | Temp 97.7°F | Resp 16 | Ht 61.5 in | Wt 161.0 lb

## 2013-11-23 DIAGNOSIS — D649 Anemia, unspecified: Secondary | ICD-10-CM

## 2013-11-23 DIAGNOSIS — E785 Hyperlipidemia, unspecified: Secondary | ICD-10-CM

## 2013-11-23 DIAGNOSIS — Z79899 Other long term (current) drug therapy: Secondary | ICD-10-CM

## 2013-11-23 DIAGNOSIS — Z0000 Encounter for general adult medical examination without abnormal findings: Secondary | ICD-10-CM

## 2013-11-23 DIAGNOSIS — Z1212 Encounter for screening for malignant neoplasm of rectum: Secondary | ICD-10-CM

## 2013-11-23 DIAGNOSIS — E039 Hypothyroidism, unspecified: Secondary | ICD-10-CM

## 2013-11-23 DIAGNOSIS — E119 Type 2 diabetes mellitus without complications: Secondary | ICD-10-CM

## 2013-11-23 DIAGNOSIS — N3000 Acute cystitis without hematuria: Secondary | ICD-10-CM

## 2013-11-23 DIAGNOSIS — I10 Essential (primary) hypertension: Secondary | ICD-10-CM

## 2013-11-23 LAB — CBC WITH DIFFERENTIAL/PLATELET
Basophils Relative: 1 % (ref 0–1)
Eosinophils Absolute: 0.5 10*3/uL (ref 0.0–0.7)
Hemoglobin: 13.6 g/dL (ref 12.0–15.0)
Lymphs Abs: 2.9 10*3/uL (ref 0.7–4.0)
MCH: 31.6 pg (ref 26.0–34.0)
Monocytes Relative: 8 % (ref 3–12)
Neutro Abs: 5.1 10*3/uL (ref 1.7–7.7)
Neutrophils Relative %: 55 % (ref 43–77)
Platelets: 264 10*3/uL (ref 150–400)
RBC: 4.31 MIL/uL (ref 3.87–5.11)

## 2013-11-23 MED ORDER — GABAPENTIN 300 MG PO CAPS: ORAL_CAPSULE | ORAL | Status: DC

## 2013-11-23 NOTE — Patient Instructions (Signed)
Try 3 of the gabapentin at night, if this does not help please call us.     Bad carbs also include fruit juice, alcohol, and sweet tea. These are empty calories that do not signal to your brain that you are full.   Please remember the good carbs are still carbs which convert into sugar. So please measure them out no more than 1/2-1 cup of rice, oatmeal, pasta, and beans.  Veggies are however free foods! Pile them on.   I like lean protein at every meal such as chicken, [place], pork chops, cottage cheese, etc. Just do not fry these meats and please center your meal around vegetable, the meats should be a side dish.   No all fruit is created equal. Please see the list below, the fruit at the bottom is higher in sugars than the fruit at the top

## 2013-11-23 NOTE — Progress Notes (Signed)
Complete Physical HPI Patient presents for complete physical.   Patient's blood pressure has been controlled at home. Patient denies chest pain, shortness of breath, dizziness. BP: 132/62 mmHg  Patient's cholesterol is diet controlled. They are on zetia and denies myalgias. The patient's cholesterol last visit was LDL 88. The patient has been working on diet and exercise for prediabetes, denies changes in vision, polys, and paresthesias. Last A1C in office was 8.3 She has peripheral neuropathy and is on Gabepentin but states that she would like to up the dose because she has pain at night.  Her last calcium was 11.1, and her sodium was 134, with chloride of 95.  Current Medications:  Current Outpatient Prescriptions on File Prior to Visit  Medication Sig Dispense Refill  . ALPRAZolam (XANAX) 0.5 MG tablet Take 0.5 mg by mouth daily.      Marland Kitchen amLODipine (NORVASC) 5 MG tablet Take 5 mg by mouth daily.      Marland Kitchen aspirin EC 81 MG tablet Take 81 mg by mouth daily.      . bimatoprost (LUMIGAN) 0.01 % SOLN Place 1 drop into both eyes at bedtime.      . brimonidine (ALPHAGAN P) 0.1 % SOLN Place 1 drop into the right eye 2 (two) times daily.      . brinzolamide (AZOPT) 1 % ophthalmic suspension Place 1 drop into the right eye 2 (two) times daily.      Marland Kitchen CALCIUM-MAGNESIUM-ZINC PO Take 1 tablet by mouth daily.      Marland Kitchen Carbonyl Iron (PERFECT IRON PO) Take 1 tablet by mouth daily.      . cycloSPORINE (RESTASIS) 0.05 % ophthalmic emulsion Place 1 drop into both eyes 2 (two) times daily.      Marland Kitchen ezetimibe (ZETIA) 10 MG tablet Take 10 mg by mouth daily.      Marland Kitchen glipiZIDE (GLUCOTROL) 5 MG tablet Take 7.5 mg by mouth daily.      . hydrochlorothiazide (HYDRODIURIL) 25 MG tablet Take 25 mg by mouth daily.      Marland Kitchen levothyroxine (SYNTHROID, LEVOTHROID) 50 MCG tablet Take 50 mcg by mouth daily.      Marland Kitchen lisinopril (PRINIVIL,ZESTRIL) 10 MG tablet Take 1 tablet (10 mg total) by mouth daily.  90 tablet  2  . meclizine  (ANTIVERT) 25 MG tablet Take 25 mg by mouth every 4 (four) hours as needed. For vertigo      . metFORMIN (GLUMETZA) 500 MG (MOD) 24 hr tablet Take 500-1,000 mg by mouth daily with breakfast. 1 tablet in the morning  2 tablets at night      . Multiple Vitamin (MULITIVITAMIN WITH MINERALS) TABS Take 1 tablet by mouth daily.      . nitrofurantoin (MACRODANTIN) 100 MG capsule Take 100 mg by mouth 2 (two) times daily.      Marland Kitchen omega-3 acid ethyl esters (LOVAZA) 1 G capsule Take 1 g by mouth daily.      . traMADol (ULTRAM) 50 MG tablet Take 1 tablet (50 mg total) by mouth 4 (four) times daily as needed. For pain  90 tablet  0  . vitamin E 400 UNIT capsule Take 400 Units by mouth daily.       No current facility-administered medications on file prior to visit.   Health Maintenance:  Tetanus: 2013 Pneumovax: 2013 Flu vaccine: 2014 Zostavax: 2013 Pap: remote MGM: 2011- declines any further work up DEXA: 12/2012 osteopenia Colonoscopy: declines EGD: declines Allergies:  Allergies  Allergen Reactions  . Codeine Rash  .  Macrobid [Nitrofurantoin Macrocrystal] Rash   Medical History:  Past Medical History  Diagnosis Date  . Type II or unspecified type diabetes mellitus without mention of complication, not stated as uncontrolled   . Hypertension   . Hyperlipidemia   . Neuropathy   . Hypothyroidism   . Anemia   . Glaucoma    Surgical History:  Past Surgical History  Procedure Laterality Date  . Breast surgery    . Cholecystectomy    . Thyroidectomy     Family History:  Family History  Problem Relation Age of Onset  . Heart disease Mother   . Cancer Other   . Heart attack Other    Social History:  History   Social History  . Marital Status: Widowed    Spouse Name: N/A    Number of Children: N/A  . Years of Education: N/A   Occupational History  . Not on file.   Social History Main Topics  . Smoking status: Former Games developer  . Smokeless tobacco: Not on file  . Alcohol Use:  No  . Drug Use: No  . Sexual Activity: Not on file   Other Topics Concern  . Not on file   Social History Narrative  . No narrative on file   ROS Constitutional: Denies weight loss/gain, headaches, insomnia, fatigue, night sweats, and change in appetite. Eyes: DEE in Sept 2014, and her eyes are stable Denies redness, blurred vision, diplopia, discharge, itchy, watery eyes.  ENT: Denies discharge, congestion, post nasal drip, sore throat, earache, hearing loss, dental pain, Tinnitus, Vertigo, Sinus pain, snoring.  Cardio: Denies chest pain, palpitations, irregular heartbeat, dyspnea, diaphoresis, orthopnea, PND, claudication, edema Respiratory: denies cough, dyspnea, pleurisy, hoarseness, wheezing.  Gastrointestinal: States she goes back and forth between diarrhea and constipation Denies dysphagia, heartburn, pain, cramps, nausea, vomiting, bloating, hematemesis, melena, hematochezia, hemorrhoids Genitourinary: Just for past 2 days she states she has incontinence Denies dysuria, frequency, urgency, nocturia, hesitancy, discharge, hematuria, flank pain Musculoskeletal: + bilateral shoulder pain, patient states worse when trying to get on coat and contributes to her mysectomy, R>L  Denies arthralgia, myalgia, stiffness, Jt. Swelling, pain, Skin: Denies pruritis, rash, hives,  acne, eczema, changing in skin lesion Neuro: Denies Weakness, tremor, incoordination, spasms, paresthesia, pain Psychiatric: Denies confusion, memory loss, sensory loss Endocrine: + paresthesia Denies change in weight, skin, hair change, nocturia, and , Diabetic Denies Polys, visual blurring, hyper /hypo glycemic episodes.  Heme/Lymph: Denies Excessive bleeding, bruising, enlarged lymph nodes  Physical Exam: Estimated body mass index is 29.93 kg/(m^2) as calculated from the following:   Height as of this encounter: 5' 1.5" (1.562 m).   Weight as of this encounter: 161 lb (73.029 kg). Filed Vitals:   11/23/13 1419   BP: 132/62  Pulse: 76  Temp: 97.7 F (36.5 C)  Resp: 16   General Appearance: Well nourished, in no apparent distress. Eyes: PERRLA, EOMs, conjunctiva no swelling or erythema, normal fundi and vessels. Sinuses: No Frontal/maxillary tenderness ENT/Mouth: Ext aud canals clear, normal light reflex with TMs without erythema, bulging.  No erythema, swelling, or exudate on post pharynx. Tonsils not swollen or erythematous. Hearing normal.  Neck: Supple, thyroid normal. No bruits Respiratory: Respiratory effort normal, BS equal bilaterally without rales, rhonchi, wheezing or stridor. Cardio: Heart sounds normal, regular rate and rhythm with 3/6 systolic murmur without rubs or gallops. Peripheral pulses brisk and equal bilaterally, without edema.  Chest: symmetric, with normal excursions and percussion. Breasts: defer Abdomen: Obese, soft, with bowl sounds. Non tender, no  guarding, rebound, hernias, masses, or organomegaly. .  Lymphatics: Non tender without lymphadenopathy.  Genitourinary: defer Musculoskeletal: Full ROM all peripheral extremities,5/5 strength, and normal gait. Skin: Warm, dry without rashes, lesions, ecchymosis.  Neuro: Cranial nerves intact, reflexes equal bilaterally. Normal muscle tone, no cerebellar symptoms. Sensation decreased bilateral feet Psych: Awake and oriented X 3, normal affect, Insight and Judgment appropriate.   EKG: WNL no changes.  Assessment and Plan: Type II or unspecified type diabetes mellitus with peripheral neuropathy- long discussion diet and exercise.   Hypertension- at goal cont same  Hyperlipidemia- continue zetia- check lipids  Neuropathy secondary to DM- increase gabapentin to 3 pills at night  Hypothyroidism- check TSH  Anemia- check labs  Glaucoma- continue to follow up with eye doctor  Health maintainence.    Melissa Mccullough 2:35 PM

## 2013-11-24 LAB — BASIC METABOLIC PANEL WITH GFR
CO2: 29 mEq/L (ref 19–32)
Calcium: 10.2 mg/dL (ref 8.4–10.5)
GFR, Est African American: >89 mL/min
Glucose, Bld: 134 mg/dL — ABNORMAL HIGH (ref 70–99)
Potassium: 4.8 mEq/L (ref 3.5–5.3)
Sodium: 137 mEq/L (ref 135–145)

## 2013-11-24 LAB — HEPATIC FUNCTION PANEL
Bilirubin, Direct: 0.1 mg/dL (ref 0.0–0.3)
Indirect Bilirubin: 0.4 mg/dL (ref 0.0–0.9)
Total Bilirubin: 0.5 mg/dL (ref 0.3–1.2)

## 2013-11-24 LAB — LIPID PANEL
Cholesterol: 181 mg/dL (ref 0–200)
LDL Cholesterol: 91 mg/dL (ref 0–99)
Total CHOL/HDL Ratio: 3.9 Ratio
Triglycerides: 215 mg/dL — ABNORMAL HIGH (ref <150)
VLDL: 43 mg/dL — ABNORMAL HIGH (ref 0–40)

## 2013-11-24 LAB — URINALYSIS, MICROSCOPIC ONLY
Bacteria, UA: NONE SEEN
Casts: NONE SEEN
Crystals: NONE SEEN

## 2013-11-24 LAB — MAGNESIUM: Magnesium: 1.7 mg/dL (ref 1.5–2.5)

## 2013-11-24 LAB — IRON AND TIBC: %SAT: 38 % (ref 20–55)

## 2013-11-24 LAB — URINALYSIS, ROUTINE W REFLEX MICROSCOPIC
Bilirubin Urine: NEGATIVE
Glucose, UA: NEGATIVE mg/dL
Hgb urine dipstick: NEGATIVE
Ketones, ur: NEGATIVE mg/dL
Specific Gravity, Urine: 1.007 (ref 1.005–1.030)
pH: 7.5 (ref 5.0–8.0)

## 2013-11-24 LAB — MICROALBUMIN / CREATININE URINE RATIO
Creatinine, Urine: 25.7 mg/dL
Microalb Creat Ratio: 19.5 mg/g (ref 0.0–30.0)

## 2013-11-25 LAB — URINE CULTURE
Colony Count: NO GROWTH
Organism ID, Bacteria: NO GROWTH

## 2013-12-05 ENCOUNTER — Other Ambulatory Visit: Payer: Self-pay | Admitting: Internal Medicine

## 2013-12-05 DIAGNOSIS — Z1212 Encounter for screening for malignant neoplasm of rectum: Secondary | ICD-10-CM

## 2013-12-05 LAB — POC HEMOCCULT BLD/STL (HOME/3-CARD/SCREEN): Card #2 Fecal Occult Blod, POC: NEGATIVE

## 2014-01-16 ENCOUNTER — Other Ambulatory Visit: Payer: Self-pay | Admitting: Emergency Medicine

## 2014-01-16 MED ORDER — GABAPENTIN 300 MG PO CAPS: ORAL_CAPSULE | ORAL | Status: DC

## 2014-01-23 ENCOUNTER — Other Ambulatory Visit: Payer: Self-pay | Admitting: Emergency Medicine

## 2014-02-13 ENCOUNTER — Other Ambulatory Visit: Payer: Self-pay | Admitting: Internal Medicine

## 2014-02-28 ENCOUNTER — Ambulatory Visit (INDEPENDENT_AMBULATORY_CARE_PROVIDER_SITE_OTHER): Payer: Medicare Other | Admitting: Physician Assistant

## 2014-02-28 ENCOUNTER — Encounter: Payer: Self-pay | Admitting: Physician Assistant

## 2014-02-28 VITALS — BP 120/78 | HR 76 | Temp 97.7°F | Resp 16 | Ht 61.5 in | Wt 161.0 lb

## 2014-02-28 DIAGNOSIS — E119 Type 2 diabetes mellitus without complications: Secondary | ICD-10-CM

## 2014-02-28 DIAGNOSIS — G589 Mononeuropathy, unspecified: Secondary | ICD-10-CM

## 2014-02-28 DIAGNOSIS — I10 Essential (primary) hypertension: Secondary | ICD-10-CM

## 2014-02-28 DIAGNOSIS — E039 Hypothyroidism, unspecified: Secondary | ICD-10-CM

## 2014-02-28 DIAGNOSIS — Z79899 Other long term (current) drug therapy: Secondary | ICD-10-CM

## 2014-02-28 DIAGNOSIS — Z0000 Encounter for general adult medical examination without abnormal findings: Secondary | ICD-10-CM

## 2014-02-28 DIAGNOSIS — E785 Hyperlipidemia, unspecified: Secondary | ICD-10-CM

## 2014-02-28 DIAGNOSIS — Z1331 Encounter for screening for depression: Secondary | ICD-10-CM

## 2014-02-28 DIAGNOSIS — G629 Polyneuropathy, unspecified: Secondary | ICD-10-CM

## 2014-02-28 LAB — BASIC METABOLIC PANEL WITH GFR
BUN: 17 mg/dL (ref 6–23)
CALCIUM: 10.1 mg/dL (ref 8.4–10.5)
CO2: 30 mEq/L (ref 19–32)
CREATININE: 0.8 mg/dL (ref 0.50–1.10)
Chloride: 98 mEq/L (ref 96–112)
GFR, EST AFRICAN AMERICAN: 79 mL/min
GFR, EST NON AFRICAN AMERICAN: 68 mL/min
GLUCOSE: 190 mg/dL — AB (ref 70–99)
POTASSIUM: 4.8 meq/L (ref 3.5–5.3)
Sodium: 136 mEq/L (ref 135–145)

## 2014-02-28 LAB — CBC WITH DIFFERENTIAL/PLATELET
BASOS ABS: 0 10*3/uL (ref 0.0–0.1)
BASOS PCT: 0 % (ref 0–1)
EOS ABS: 0.4 10*3/uL (ref 0.0–0.7)
Eosinophils Relative: 4 % (ref 0–5)
HEMATOCRIT: 42.4 % (ref 36.0–46.0)
Hemoglobin: 14.1 g/dL (ref 12.0–15.0)
Lymphocytes Relative: 27 % (ref 12–46)
Lymphs Abs: 2.6 10*3/uL (ref 0.7–4.0)
MCH: 30.7 pg (ref 26.0–34.0)
MCHC: 33.3 g/dL (ref 30.0–36.0)
MCV: 92.2 fL (ref 78.0–100.0)
MONO ABS: 0.7 10*3/uL (ref 0.1–1.0)
MONOS PCT: 7 % (ref 3–12)
NEUTROS ABS: 6.1 10*3/uL (ref 1.7–7.7)
Neutrophils Relative %: 62 % (ref 43–77)
Platelets: 274 10*3/uL (ref 150–400)
RBC: 4.6 MIL/uL (ref 3.87–5.11)
RDW: 12.8 % (ref 11.5–15.5)
WBC: 9.8 10*3/uL (ref 4.0–10.5)

## 2014-02-28 LAB — LIPID PANEL
CHOLESTEROL: 181 mg/dL (ref 0–200)
HDL: 49 mg/dL (ref >39)
LDL CALC: 98 mg/dL (ref 0–99)
TRIGLYCERIDES: 169 mg/dL — AB (ref <150)
Total CHOL/HDL Ratio: 3.7 Ratio
VLDL: 34 mg/dL (ref 0–40)

## 2014-02-28 LAB — HEPATIC FUNCTION PANEL
ALBUMIN: 4.1 g/dL (ref 3.5–5.2)
ALT: 22 U/L (ref 0–35)
AST: 32 U/L (ref 0–37)
Alkaline Phosphatase: 64 U/L (ref 39–117)
BILIRUBIN INDIRECT: 0.5 mg/dL (ref 0.2–1.2)
Bilirubin, Direct: 0.1 mg/dL (ref 0.0–0.3)
TOTAL PROTEIN: 7.5 g/dL (ref 6.0–8.3)
Total Bilirubin: 0.6 mg/dL (ref 0.2–1.2)

## 2014-02-28 LAB — MAGNESIUM: MAGNESIUM: 1.8 mg/dL (ref 1.5–2.5)

## 2014-02-28 LAB — TSH: TSH: 0.518 u[IU]/mL (ref 0.350–4.500)

## 2014-02-28 LAB — HEMOGLOBIN A1C
Hgb A1c MFr Bld: 7.6 % — ABNORMAL HIGH (ref <5.7)
MEAN PLASMA GLUCOSE: 171 mg/dL — AB (ref <117)

## 2014-02-28 NOTE — Progress Notes (Signed)
Subjective:   Melissa Mccullough is a 80 y.o. female who presents for Medicare Annual Wellness Visit and 3 month follow up on hypertension, diabetes, hyperlipidemia, vitamin D def.  Date of last medicare wellness visit is unknown.   Her blood pressure has been controlled at home, today their BP is BP: 120/78 mmHg She does not workout. She denies chest pain, shortness of breath, dizziness.  She is on cholesterol medication and denies myalgias. Her cholesterol is at goal. The cholesterol last visit was:   Lab Results  Component Value Date   CHOL 181 11/23/2013   HDL 47 11/23/2013   LDLCALC 91 11/23/2013   TRIG 215* 11/23/2013   CHOLHDL 3.9 11/23/2013   She has been working on diet and exercise for diabetes, and denies foot ulcerations, hypoglycemia , increased appetite, nausea, polydipsia and polyuria. Last A1C in the office was:  Lab Results  Component Value Date   HGBA1C 7.8* 11/23/2013   Patient is on Vitamin D supplement. She complains of bilateral feet paraesthesias she is on Gabepentin 300mg  3 at night and 1 in AM, 1 at lunch.  She has been on benadryl and claritin at night.  Names of Other Physician/Practitioners you currently use: 1. [facility]- here for primary care 2. Dr. Katy Fitch, Dr. Jackelyn Poling eye doctor, last visit 02/2014 3. Remote dentist, last visit unknown Patient Care Team: Unk Pinto, MD as PCP - General (Internal Medicine) Deliah Goody, MD as Consulting Physician (Ophthalmology)  Medication Review Current Outpatient Prescriptions on File Prior to Visit  Medication Sig Dispense Refill  . ALPRAZolam (XANAX) 0.5 MG tablet Take 0.5 mg by mouth daily.      Marland Kitchen amLODipine (NORVASC) 5 MG tablet Take 5 mg by mouth daily.      Marland Kitchen aspirin EC 81 MG tablet Take 81 mg by mouth daily.      . bimatoprost (LUMIGAN) 0.01 % SOLN Place 1 drop into both eyes at bedtime.      . brimonidine (ALPHAGAN P) 0.1 % SOLN Place 1 drop into the right  eye 2 (two) times daily.      . brinzolamide (AZOPT) 1 % ophthalmic suspension Place 1 drop into the right eye 2 (two) times daily.      Marland Kitchen CALCIUM-MAGNESIUM-ZINC PO Take 1 tablet by mouth daily.      Marland Kitchen Carbonyl Iron (PERFECT IRON PO) Take 1 tablet by mouth daily.      . cycloSPORINE (RESTASIS) 0.05 % ophthalmic emulsion Place 1 drop into both eyes 2 (two) times daily.      Marland Kitchen ezetimibe (ZETIA) 10 MG tablet Take 10 mg by mouth daily.      Marland Kitchen gabapentin (NEURONTIN) 300 MG capsule Take one in the morning, one at lunch and 3 at night  150 capsule  4  . glipiZIDE (GLUCOTROL) 5 MG tablet Take 7.5 mg by mouth daily.      . hydrochlorothiazide (HYDRODIURIL) 25 MG tablet Take 25 mg by mouth daily.      Marland Kitchen levothyroxine (SYNTHROID, LEVOTHROID) 50 MCG tablet Take 50 mcg by mouth daily.      Marland Kitchen lisinopril (PRINIVIL,ZESTRIL) 10 MG tablet Take 1 tablet (10 mg total) by mouth daily.  90 tablet  2  . meclizine (ANTIVERT) 25 MG tablet Take 25 mg by mouth every 4 (four) hours as needed. For vertigo      . metFORMIN (GLUMETZA) 500 MG (MOD) 24 hr tablet Take 500-1,000 mg by mouth daily with breakfast. 1 tablet  in the morning  2 tablets at night      . Multiple Vitamin (MULITIVITAMIN WITH MINERALS) TABS Take 1 tablet by mouth daily.      . nitrofurantoin (MACRODANTIN) 100 MG capsule Take 100 mg by mouth 2 (two) times daily.      Marland Kitchen omega-3 acid ethyl esters (LOVAZA) 1 G capsule Take 1 g by mouth daily.      . traMADol (ULTRAM) 50 MG tablet TAKE ONE TABLET BY MOUTH THREE TIMES DAILY AS NEEDED FOR PAIN  90 tablet  0  . vitamin E 400 UNIT capsule Take 400 Units by mouth daily.       No current facility-administered medications on file prior to visit.    Current Problems (verified) Patient Active Problem List   Diagnosis Date Noted  . Type II or unspecified type diabetes mellitus without mention of complication, not stated as uncontrolled   . Hyperlipidemia   . Neuropathy   . Hypothyroidism   . Anemia   . Glaucoma    . Community acquired pneumonia 05/19/2012  . HTN (hypertension), benign 05/19/2012  . Abdominal  pain, other specified site 05/19/2012    Screening Tests Health Maintenance  Topic Date Due  . Colonoscopy  07/14/1980  . Influenza Vaccine  07/15/2014  . Tetanus/tdap  11/18/2022  . Pneumococcal Polysaccharide Vaccine Age 57 And Over  Completed  . Zostavax  Completed     Immunization History  Administered Date(s) Administered  . Influenza-Unspecified 09/21/2013  . Pneumococcal Polysaccharide-23 09/09/2012  . Td 11/18/2012  . Zoster 11/18/2012    Preventative care: Tetanus: 2013  Pneumovax: 2013  Flu vaccine: 2014  Zostavax: 2013  Pap: remote  MGM: 2011- declines any further work up  DEXA: 12/2012 osteopenia  Colonoscopy: declines  EGD: declines   History reviewed: allergies, current medications, past family history, past medical history, past social history, past surgical history and problem list  Risk Factors: Osteoporosis: postmenopausal estrogen deficiency and amenorrhea History of fracture in the past year: no  Tobacco History  Substance Use Topics  . Smoking status: Former Research scientist (life sciences)  . Smokeless tobacco: Not on file  . Alcohol Use: No   She does not smoke.  Patient is a former smoker. Are there smokers in your home (other than you)?  No  Alcohol Current alcohol use: none  Caffeine Current caffeine use: denies use  Exercise Exercise limitations: The patient is experiencing exercise intolerance (walks with cane, imbalance). Current exercise: none  Nutrition/Diet Current diet: in general, a "healthy" diet    Cardiac risk factors: advanced age (older than 37 for men, 29 for women), diabetes mellitus, dyslipidemia, family history of premature cardiovascular disease, hypertension, obesity (BMI >= 30 kg/m2) and sedentary lifestyle.  Depression Screen Nurse depression screen reviewed.  (Note: if answer to either of the following is "Yes", a more complete  depression screening is indicated)   Q1: Over the past two weeks, have you felt down, depressed or hopeless? No  Q2: Over the past two weeks, have you felt little interest or pleasure in doing things? No  Have you lost interest or pleasure in daily life? No  Do you often feel hopeless? No  Do you cry easily over simple problems? No  Activities of Daily Living Nurse ADLs screen reviewed.  In your present state of health, do you have any difficulty performing the following activities?:  Driving? Yes Managing money?  Yes Feeding yourself? No Getting from bed to chair? No Climbing a flight of stairs? Yes Preparing  food and eating?: No Bathing or showering? No Getting dressed: No Getting to the toilet? No Using the toilet:No Moving around from place to place: Yes In the past year have you fallen or had a near fall?:Yes   Are you sexually active?  No  Do you have more than one partner?  No  Vision Difficulties: No  Hearing Difficulties: Yes Do you often ask people to speak up or repeat themselves? Yes Do you experience ringing or noises in your ears? No Do you have difficulty understanding soft or whispered voices? Yes  Cognition  Do you feel that you have a problem with memory?Yes  Do you often misplace items? Yes  Do you feel safe at home?  Yes  Advanced directives Does patient have a [place]? No Does patient have a Living Will? No    Objective:     Vision and hearing screens reviewed.   Blood pressure 120/78, pulse 76, temperature 97.7 F (36.5 C), resp. rate 16, height 5' 1.5" (1.562 m), weight 161 lb (73.029 kg). Body mass index is 29.93 kg/(m^2).  General appearance: alert, no distress, WD/WN,  female Cognitive Testing  Alert? Yes  Normal Appearance?Yes  Oriented to person? Yes  Place? Yes   Time? Yes  Recall of three objects?  No  Can perform simple calculations? No  Displays appropriate judgment?Yes  Can read the correct time from  a watch face?Yes  HEENT: normocephalic, sclerae anicteric, TMs pearly, nares patent, no discharge or erythema, pharynx normal Oral cavity: MMM, no lesions Neck: supple, no lymphadenopathy, no thyromegaly, no masses Heart: RRR, normal S1, S2, no murmurs Lungs: CTA bilaterally, no wheezes, rhonchi, or rales Abdomen: +bs, soft, non tender, non distended, no masses, no hepatomegaly, no splenomegaly Musculoskeletal: nontender, no swelling, no obvious deformity Extremities: no edema, no cyanosis, no clubbing Pulses: 2+ symmetric, upper and lower extremities, normal cap refill Neurological: alert, oriented x 3, CN2-12 intact, strength normal upper extremities and lower extremities, sensation decreased throughout up to 1/3 of leg, DTRs 2+ throughout, no cerebellar signs, gait wide/unsteady with a cane.  Psychiatric: normal affect, behavior normal, pleasant  Breast: defer Gyn: defer Rectal: defer   Assessment:   1. Neuropathy - decrease sugars and continue gabapentin.    2. Hypothyroidism - TSH  3. Type II or unspecified type diabetes mellitus without mention of complication, not stated as uncontrolled - Hemoglobin A1c  4. HTN (hypertension), benign - CBC with Differential - BASIC METABOLIC PANEL WITH GFR - Hepatic function panel  5. Hyperlipidemia - Lipid panel  6. Encounter for long-term (current) use of other medications - Magnesium   Plan:   During the course of the visit the patient was educated and counseled about appropriate screening and preventive services including:    Influenza vaccine  Screening electrocardiogram  Screening mammography  Bone densitometry screening  Diabetes screening  Glaucoma screening  Nutrition counseling   Advanced directives: given info  Screening recommendations, referrals:  Vaccinations: Tdap vaccine not done  Influenza vaccine yes Pneumococcal vaccine not done Shingles vaccine not done Hep B vaccine not done  Nutrition  assessed and recommended  Colonoscopy - will not any more, declines Mammogram yes Pap smear no Pelvic exam no Recommended yearly ophthalmology/optometry visit for glaucoma screening and checkup Recommended yearly dental visit for hygiene and checkup Advanced directives - yes- given information  Conditions/risks identified: BMI: Discussed weight loss, diet, and increase physical activity.  Increase physical activity: AHA recommends 150 minutes of physical activity a  week.  Medications reviewed DEXA- requested Diabetes is not at goal, ACE/ARB therapy: Yes. Urinary Incontinence is not an issue: discussed non pharmacology and pharmacology options.  Fall risk: high- discussed PT, home fall assessment, medications.   Medicare Attestation I have personally reviewed: The patient's medical and social history Their use of alcohol, tobacco or illicit drugs Their current medications and supplements The patient's functional ability including ADLs,fall risks, home safety risks, cognitive, and hearing and visual impairment Diet and physical activities Evidence for depression or mood disorders  The patient's weight, height, BMI, and visual acuity have been recorded in the chart.  I have made referrals, counseling, and provided education to the patient based on review of the above and I have provided the patient with a written personalized care plan for preventive services.     Vicie Mutters, PA-C   02/28/2014

## 2014-02-28 NOTE — Patient Instructions (Signed)
Preventative Care for Adults - Female      MAINTAIN REGULAR HEALTH EXAMS:  A routine yearly physical is a good way to check in with your primary care provider about your health and preventive screening. It is also an opportunity to share updates about your health and any concerns you have, and receive a thorough all-over exam.   Most health insurance companies pay for at least some preventative services.  Check with your health plan for specific coverages.  WHAT PREVENTATIVE SERVICES DO WOMEN NEED?  Adult women should have their weight and blood pressure checked regularly.   Women age 78 and older should have their cholesterol levels checked regularly.  Women should be screened for cervical cancer with a Pap smear and pelvic exam beginning at either age 78, or 3 years after they become sexually activity.    Breast cancer screening generally begins at age 78 with a mammogram and breast exam by your primary care provider.    Beginning at age 78 and continuing to age 78, women should be screened for colorectal cancer.  Certain people may need continued testing until age 85.  Updating vaccinations is part of preventative care.  Vaccinations help protect against diseases such as the flu.  Osteoporosis is a disease in which the bones lose minerals and strength as we age. Women ages 65 and over should discuss this with their caregivers, as should women after menopause who have other risk factors.  Lab tests are generally done as part of preventative care to screen for anemia and blood disorders, to screen for problems with the kidneys and liver, to screen for bladder problems, to check blood sugar, and to check your cholesterol level.  Preventative services generally include counseling about diet, exercise, avoiding tobacco, drugs, excessive alcohol consumption, and sexually transmitted infections.    GENERAL RECOMMENDATIONS FOR GOOD HEALTH:  Healthy diet:  Eat a variety of foods, including  fruit, vegetables, animal or vegetable protein, such as meat, fish, chicken, and eggs, or beans, lentils, tofu, and grains, such as rice.  Drink plenty of water daily.  Decrease saturated fat in the diet, avoid lots of red meat, processed foods, sweets, fast foods, and fried foods.  Exercise:  Aerobic exercise helps maintain good heart health. At least 30-40 minutes of moderate-intensity exercise is recommended. For example, a brisk walk that increases your heart rate and breathing. This should be done on most days of the week.   Find a type of exercise or a variety of exercises that you enjoy so that it becomes a part of your daily life.  Examples are running, walking, swimming, water aerobics, and biking.  For motivation and support, explore group exercise such as aerobic class, spin class, Zumba, Yoga,or  martial arts, etc.    Set exercise goals for yourself, such as a certain weight goal, walk or run in a race such as a 5k walk/run.  Speak to your primary care provider about exercise goals.  Disease prevention:  If you smoke or chew tobacco, find out from your caregiver how to quit. It can literally save your life, no matter how long you have been a tobacco user. If you do not use tobacco, never begin.   Maintain a healthy diet and normal weight. Increased weight leads to problems with blood pressure and diabetes.   The Body Mass Index or BMI is a way of measuring how much of your body is fat. Having a BMI above 27 increases the risk of heart disease,   diabetes, hypertension, stroke and other problems related to obesity. Your caregiver can help determine your BMI and based on it develop an exercise and dietary program to help you achieve or maintain this important measurement at a healthful level.  High blood pressure causes heart and blood vessel problems.  Persistent high blood pressure should be treated with medicine if weight loss and exercise do not work.   Fat and cholesterol leaves  deposits in your arteries that can block them. This causes heart disease and vessel disease elsewhere in your body.  If your cholesterol is found to be high, or if you have heart disease or certain other medical conditions, then you may need to have your cholesterol monitored frequently and be treated with medication.   Ask if you should have a cardiac stress test if your history suggests this. A stress test is a test done on a treadmill that looks for heart disease. This test can find disease prior to there being a problem.  Menopause can be associated with physical symptoms and risks. Hormone replacement therapy is available to decrease these. You should talk to your caregiver about whether starting or continuing to take hormones is right for you.   Osteoporosis is a disease in which the bones lose minerals and strength as we age. This can result in serious bone fractures. Risk of osteoporosis can be identified using a bone density scan. Women ages 65 and over should discuss this with their caregivers, as should women after menopause who have other risk factors. Ask your caregiver whether you should be taking a calcium supplement and Vitamin D, to reduce the rate of osteoporosis.   Avoid drinking alcohol in excess (more than two drinks per day).  Avoid use of street drugs. Do not share needles with anyone. Ask for professional help if you need assistance or instructions on stopping the use of alcohol, cigarettes, and/or drugs.  Brush your teeth twice a day with fluoride toothpaste, and floss once a day. Good oral hygiene prevents tooth decay and gum disease. The problems can be painful, unattractive, and can cause other health problems. Visit your dentist for a routine oral and dental check up and preventive care every 6-12 months.   Look at your skin regularly.  Use a mirror to look at your back. Notify your caregivers of changes in moles, especially if there are changes in shapes, colors, a size  larger than a pencil eraser, an irregular border, or development of new moles.  Safety:  Use seatbelts 100% of the time, whether driving or as a passenger.  Use safety devices such as hearing protection if you work in environments with loud noise or significant background noise.  Use safety glasses when doing any work that could send debris in to the eyes.  Use a helmet if you ride a bike or motorcycle.  Use appropriate safety gear for contact sports.  Talk to your caregiver about gun safety.  Use sunscreen with a SPF (or skin protection factor) of 15 or greater.  Lighter skinned people are at a greater risk of skin cancer. Don't forget to also wear sunglasses in order to protect your eyes from too much damaging sunlight. Damaging sunlight can accelerate cataract formation.   Practice safe sex. Use condoms. Condoms are used for birth control and to help reduce the spread of sexually transmitted infections (or STIs).  Some of the STIs are gonorrhea (the clap), chlamydia, syphilis, trichomonas, herpes, HPV (human papilloma virus) and HIV (human immunodeficiency virus)   which causes AIDS. The herpes, HIV and HPV are viral illnesses that have no cure. These can result in disability, cancer and death.   Keep carbon monoxide and smoke detectors in your home functioning at all times. Change the batteries every 6 months or use a model that plugs into the wall.   Vaccinations:  Stay up to date with your tetanus shots and other required immunizations. You should have a booster for tetanus every 10 years. Be sure to get your flu shot every year, since 5%-20% of the [place] population comes down with the flu. The flu vaccine changes each year, so being vaccinated once is not enough. Get your shot in the fall, before the flu season peaks.   Other vaccines to consider:  Human Papilloma Virus or HPV causes cancer of the cervix, and other infections that can be transmitted from person to person. There is a vaccine for  HPV, and females should get immunized between the ages of 64 and 2. It requires a series of 3 shots.   Pneumococcal vaccine to protect against certain types of pneumonia.  This is normally recommended for adults age 73 or older.  However, adults younger than 78 years old with certain underlying conditions such as diabetes, heart or lung disease should also receive the vaccine.  Shingles vaccine to protect against Varicella Zoster if you are older than age 67, or younger than 78 years old with certain underlying illness.  Hepatitis A vaccine to protect against a form of infection of the liver by a virus acquired from food.  Hepatitis B vaccine to protect against a form of infection of the liver by a virus acquired from blood or body fluids, particularly if you work in health care.  If you plan to travel internationally, check with your local health department for specific vaccination recommendations.  Cancer Screening:  Breast cancer screening is essential to preventive care for women. All women age 43 and older should perform a breast self-exam every month. At age 58 and older, women should have their caregiver complete a breast exam each year. Women at ages 67 and older should have a mammogram (x-ray film) of the breasts. Your caregiver can discuss how often you need mammograms.    Cervical cancer screening includes taking a Pap smear (sample of cells examined under a microscope) from the cervix (end of the uterus). It also includes testing for HPV (Human Papilloma Virus, which can cause cervical cancer). Screening and a pelvic exam should begin at age 63, or 3 years after a woman becomes sexually active. Screening should occur every year, with a Pap smear but no HPV testing, up to age 57. After age 36, you should have a Pap smear every 3 years with HPV testing, if no HPV was found previously.   Most routine colon cancer screening begins at the age of 49. On a yearly basis, doctors may provide  special easy to use take-home tests to check for hidden blood in the stool. Sigmoidoscopy or colonoscopy can detect the earliest forms of colon cancer and is life saving. These tests use a small camera at the end of a tube to directly examine the colon. Speak to your caregiver about this at age 56, when routine screening begins (and is repeated every 5 years unless early forms of pre-cancerous polyps or small growths are found).    Advance Directive Advance directives are the legal documents that allow you to make choices about your health care and medical treatment if  you cannot speak for yourself. Advance directives are a way for you to communicate your wishes to family, friends, and health care providers. The specified people can then convey your decisions about end-of-life care to avoid confusion if you should become unable to communicate. Ideally, the process of discussing and writing advance directives should be discussed over time rather than making decisions all at once. Advance directives can be modified as your situation changes and you can change your mind at any time even after you have signed the advance directives. Each state has its own laws regarding advance directives. You may want to check with your health care provider, attorney, or state representative about the law in your state. Below are some examples of advance directives. LIVING WILL A living will is a set of instructions documenting your wishes about medical care when you cannot care for yourself. It is used if you become:  Terminally ill.  Incapacitated.  Unable to communicate.  Unable to make decisions. Items to consider in your living will include:  The use or non-use of life-sustaining equipment, such as dialysis machines and breathing machines (ventilators).  A "do not resuscitate" (DNR) order, which is the instruction not to use CPR if breathing or heartbeat stops.  Tube feeding.  Withholding of food and  fluids.  Comfort (palliative) care when the goal becomes comfort rather than a cure.  Organ and tissue donation. A living does not give instructions about distribution of your money and property if you should pass away. It is advisable to seek the expert advice of a lawyer in drawing up a will regarding your possessions. Decisions about taxes, beneficiaries, and asset distribution will be legally binding. This process can relieve your family and friends of any burdens surrounding disputes or questions that may come up about the allocation of your assets. DO NOT RESUSCITATE (DNR) A do not resuscitate (DNR) order is a request to not have cardiopulmonary resuscitation (CPR) in the event that your heart stops or you stop breathing. Unless given other instructions, a health care provider will try to help any patient whose heart has stopped or who has stopped breathing.  HEALTHCARE PROXY AND DURABLE POWER OF ATTORNEY FOR HEALTH CARE A health care proxy is a person (agent) appointed to make medical decisions for you if you cannot. Generally, people choose someone they know well and trust to represent their preferences when they can no longer do so. You should be sure to ask this person for agreement to act as your agent. An agent may have to exercise judgment in the event of a medical decision for which your wishes are not known. The durable power of attorney for health care is the legal document that names your health care proxy. Once written, it should be:  Signed.  Notarized.  Dated.  Copied.  Witnessed.  Incorporated into your medical record. You may also want to appoint someone to manage your financial affairs if you cannot. This is called a durable power of attorney for finances. It is a separate legal document from the durable power of attorney for health care. You may choose the same person or someone different from your health care proxy to act as your agent in financial matters. Document  Released: 03/09/2008 Document Revised: 08/03/2013 Document Reviewed: 04/20/2013 [facility] Patient Information 2014 [place], [place].

## 2014-03-01 ENCOUNTER — Encounter: Payer: Self-pay | Admitting: Physician Assistant

## 2014-03-16 ENCOUNTER — Other Ambulatory Visit: Payer: Self-pay | Admitting: Physician Assistant

## 2014-03-16 ENCOUNTER — Other Ambulatory Visit: Payer: Self-pay | Admitting: Internal Medicine

## 2014-04-14 DIAGNOSIS — R739 Hyperglycemia, unspecified: Secondary | ICD-10-CM

## 2014-04-14 HISTORY — DX: Hyperglycemia, unspecified: R73.9

## 2014-05-10 ENCOUNTER — Inpatient Hospital Stay (HOSPITAL_COMMUNITY)
Admission: EM | Admit: 2014-05-10 | Discharge: 2014-05-13 | DRG: 689 | Disposition: A | Discharge status: Home Health | Payer: Medicare Other | Attending: Internal Medicine | Admitting: Internal Medicine

## 2014-05-10 ENCOUNTER — Other Ambulatory Visit: Payer: Self-pay | Admitting: Internal Medicine

## 2014-05-10 ENCOUNTER — Emergency Department (HOSPITAL_COMMUNITY): Payer: Medicare Other

## 2014-05-10 ENCOUNTER — Encounter (HOSPITAL_COMMUNITY): Payer: Self-pay | Admitting: Emergency Medicine

## 2014-05-10 DIAGNOSIS — Z79899 Other long term (current) drug therapy: Secondary | ICD-10-CM

## 2014-05-10 DIAGNOSIS — B961 Klebsiella pneumoniae [K. pneumoniae] as the cause of diseases classified elsewhere: Secondary | ICD-10-CM | POA: Diagnosis present

## 2014-05-10 DIAGNOSIS — H409 Unspecified glaucoma: Secondary | ICD-10-CM | POA: Diagnosis present

## 2014-05-10 DIAGNOSIS — E875 Hyperkalemia: Secondary | ICD-10-CM | POA: Diagnosis present

## 2014-05-10 DIAGNOSIS — D649 Anemia, unspecified: Secondary | ICD-10-CM

## 2014-05-10 DIAGNOSIS — G629 Polyneuropathy, unspecified: Secondary | ICD-10-CM

## 2014-05-10 DIAGNOSIS — Z7982 Long term (current) use of aspirin: Secondary | ICD-10-CM

## 2014-05-10 DIAGNOSIS — E871 Hypo-osmolality and hyponatremia: Secondary | ICD-10-CM | POA: Diagnosis present

## 2014-05-10 DIAGNOSIS — R109 Unspecified abdominal pain: Secondary | ICD-10-CM

## 2014-05-10 DIAGNOSIS — Z6829 Body mass index (BMI) 29.0-29.9, adult: Secondary | ICD-10-CM

## 2014-05-10 DIAGNOSIS — R7309 Other abnormal glucose: Secondary | ICD-10-CM

## 2014-05-10 DIAGNOSIS — E039 Hypothyroidism, unspecified: Secondary | ICD-10-CM | POA: Diagnosis present

## 2014-05-10 DIAGNOSIS — Z87891 Personal history of nicotine dependence: Secondary | ICD-10-CM

## 2014-05-10 DIAGNOSIS — I10 Essential (primary) hypertension: Secondary | ICD-10-CM | POA: Diagnosis present

## 2014-05-10 DIAGNOSIS — G589 Mononeuropathy, unspecified: Secondary | ICD-10-CM | POA: Diagnosis present

## 2014-05-10 DIAGNOSIS — E119 Type 2 diabetes mellitus without complications: Secondary | ICD-10-CM | POA: Diagnosis present

## 2014-05-10 DIAGNOSIS — N390 Urinary tract infection, site not specified: Principal | ICD-10-CM | POA: Diagnosis present

## 2014-05-10 DIAGNOSIS — R0902 Hypoxemia: Secondary | ICD-10-CM | POA: Diagnosis present

## 2014-05-10 DIAGNOSIS — E1129 Type 2 diabetes mellitus with other diabetic kidney complication: Secondary | ICD-10-CM | POA: Diagnosis present

## 2014-05-10 DIAGNOSIS — A419 Sepsis, unspecified organism: Secondary | ICD-10-CM

## 2014-05-10 DIAGNOSIS — E785 Hyperlipidemia, unspecified: Secondary | ICD-10-CM | POA: Diagnosis present

## 2014-05-10 DIAGNOSIS — R739 Hyperglycemia, unspecified: Secondary | ICD-10-CM

## 2014-05-10 DIAGNOSIS — J189 Pneumonia, unspecified organism: Secondary | ICD-10-CM | POA: Diagnosis present

## 2014-05-10 DIAGNOSIS — N179 Acute kidney failure, unspecified: Secondary | ICD-10-CM | POA: Diagnosis present

## 2014-05-10 HISTORY — DX: Hyperglycemia, unspecified: R73.9

## 2014-05-10 LAB — URINALYSIS, ROUTINE W REFLEX MICROSCOPIC
BILIRUBIN URINE: NEGATIVE
Glucose, UA: NEGATIVE mg/dL
Ketones, ur: NEGATIVE mg/dL
NITRITE: POSITIVE — AB
PH: 6 (ref 5.0–8.0)
Protein, ur: NEGATIVE mg/dL
Specific Gravity, Urine: 1.014 (ref 1.005–1.030)
UROBILINOGEN UA: 0.2 mg/dL (ref 0.0–1.0)

## 2014-05-10 LAB — CBC
HCT: 36.3 % (ref 36.0–46.0)
HEMOGLOBIN: 12.4 g/dL (ref 12.0–15.0)
MCH: 32.2 pg (ref 26.0–34.0)
MCHC: 34.2 g/dL (ref 30.0–36.0)
MCV: 94.3 fL (ref 78.0–100.0)
PLATELETS: 241 10*3/uL (ref 150–400)
RBC: 3.85 MIL/uL — AB (ref 3.87–5.11)
RDW: 13.4 % (ref 11.5–15.5)
WBC: 28.3 10*3/uL — AB (ref 4.0–10.5)

## 2014-05-10 LAB — COMPREHENSIVE METABOLIC PANEL
ALK PHOS: 76 U/L (ref 39–117)
ALT: 24 U/L (ref 0–35)
AST: 36 U/L (ref 0–37)
Albumin: 3.4 g/dL — ABNORMAL LOW (ref 3.5–5.2)
BUN: 35 mg/dL — ABNORMAL HIGH (ref 6–23)
CALCIUM: 10.3 mg/dL (ref 8.4–10.5)
CO2: 25 meq/L (ref 19–32)
Chloride: 94 mEq/L — ABNORMAL LOW (ref 96–112)
Creatinine, Ser: 1.41 mg/dL — ABNORMAL HIGH (ref 0.50–1.10)
GFR calc Af Amer: 39 mL/min — ABNORMAL LOW (ref >90)
GFR, EST NON AFRICAN AMERICAN: 33 mL/min — AB (ref >90)
GLUCOSE: 277 mg/dL — AB (ref 70–99)
POTASSIUM: 6.3 meq/L — AB (ref 3.7–5.3)
SODIUM: 132 meq/L — AB (ref 137–147)
Total Bilirubin: 0.6 mg/dL (ref 0.3–1.2)
Total Protein: 7.3 g/dL (ref 6.0–8.3)

## 2014-05-10 LAB — URINE MICROSCOPIC-ADD ON

## 2014-05-10 LAB — I-STAT CG4 LACTIC ACID, ED: LACTIC ACID, VENOUS: 2.56 mmol/L — AB (ref 0.5–2.2)

## 2014-05-10 LAB — CBG MONITORING, ED
GLUCOSE-CAPILLARY: 254 mg/dL — AB (ref 70–99)
Glucose-Capillary: 230 mg/dL — ABNORMAL HIGH (ref 70–99)

## 2014-05-10 LAB — POTASSIUM: Potassium: 5.1 mEq/L (ref 3.7–5.3)

## 2014-05-10 LAB — PRO B NATRIURETIC PEPTIDE: Pro B Natriuretic peptide (BNP): 683.2 pg/mL — ABNORMAL HIGH (ref 0–450)

## 2014-05-10 MED ORDER — DEXTROSE 5 % IV SOLN
1.0000 g | Freq: Once | INTRAVENOUS | Status: AC
  Administered 2014-05-10: 1 g via INTRAVENOUS
  Filled 2014-05-10: qty 10

## 2014-05-10 MED ORDER — IPRATROPIUM-ALBUTEROL 0.5-2.5 (3) MG/3ML IN SOLN
3.0000 mL | Freq: Once | RESPIRATORY_TRACT | Status: AC
  Administered 2014-05-10: 3 mL via RESPIRATORY_TRACT
  Filled 2014-05-10: qty 3

## 2014-05-10 MED ORDER — SODIUM CHLORIDE 0.9 % IV BOLUS (SEPSIS)
1000.0000 mL | Freq: Once | INTRAVENOUS | Status: AC
  Administered 2014-05-10: 1000 mL via INTRAVENOUS

## 2014-05-10 MED ORDER — AZITHROMYCIN 500 MG IV SOLR
500.0000 mg | Freq: Once | INTRAVENOUS | Status: AC
  Administered 2014-05-10: 500 mg via INTRAVENOUS

## 2014-05-10 NOTE — ED Notes (Signed)
Duplicate order discontinued,

## 2014-05-10 NOTE — ED Notes (Signed)
Notified MD and RN of elevated i-stat lactic acid of 2.56

## 2014-05-10 NOTE — ED Notes (Addendum)
Patient and patients family states that her "blood sugar has been high all day." Denies any fever, n/v/d Denies any chest pain, shortness of breath.

## 2014-05-10 NOTE — ED Notes (Signed)
Phlebotomy at bedside.

## 2014-05-10 NOTE — ED Provider Notes (Signed)
TIME SEEN: 7:50 PM  CHIEF COMPLAINT: Hyperglycemia  HPI: Patient is a 80 y.o. F with history of hypertension, hyperlipidemia, hypothyroidism, type 2 diabetes on oral medications who presents to the emergency department with complaints of hyperglycemia today. Per patient's son, her blood glucose was 350s at home. She states it is normally under 200. He denies any changes in her diet. She denies any fevers, vomiting or diarrhea. She has had a mild cough but no chest pain or shortness of breath. No rash or tick bite. No headache, neck pain or neck stiffness. She has not missed any doses of her medications.  PCP is Dr. Melford Aase  ROS: See HPI, limited as patient is a poor historian Constitutional: no fever  Eyes: no drainage  ENT: no runny nose   Cardiovascular:  no chest pain  Resp: no SOB  GI: no vomiting GU: no dysuria Integumentary: no rash  Allergy: no hives  Musculoskeletal: no leg swelling  Neurological: no slurred speech ROS otherwise negative  PAST MEDICAL HISTORY/PAST SURGICAL HISTORY:  Past Medical History  Diagnosis Date  . Type II or unspecified type diabetes mellitus without mention of complication, not stated as uncontrolled   . Hypertension   . Hyperlipidemia   . Neuropathy   . Hypothyroidism   . Anemia   . Glaucoma     MEDICATIONS:  Prior to Admission medications   Medication Sig Start Date End Date Taking? Authorizing Provider  ALPRAZolam (XANAX) 0.5 MG tablet TAKE 1 TABLET BY MOUTH THREE TIMES DAILY AS NEEDED FOR ANXIETY    Melissa R Smith, PA-C  amLODipine (NORVASC) 5 MG tablet TAKE 1 TABLET BY MOUTH EVERY DAY    Melissa R Smith, PA-C  aspirin EC 81 MG tablet Take 81 mg by mouth daily.    Historical Provider, MD  bimatoprost (LUMIGAN) 0.01 % SOLN Place 1 drop into both eyes at bedtime.    Historical Provider, MD  brimonidine (ALPHAGAN P) 0.1 % SOLN Place 1 drop into the right eye 2 (two) times daily.    Historical Provider, MD  brinzolamide (AZOPT) 1 %  ophthalmic suspension Place 1 drop into the right eye 2 (two) times daily.    Historical Provider, MD  CALCIUM-MAGNESIUM-ZINC PO Take 1 tablet by mouth daily.    Historical Provider, MD  Carbonyl Iron (PERFECT IRON PO) Take 1 tablet by mouth daily.    Historical Provider, MD  cycloSPORINE (RESTASIS) 0.05 % ophthalmic emulsion Place 1 drop into both eyes 2 (two) times daily.    Historical Provider, MD  gabapentin (NEURONTIN) 300 MG capsule Take one in the morning, one at lunch and 3 at night 01/16/14   Melissa R Smith, PA-C  glipiZIDE (GLUCOTROL) 5 MG tablet Take 7.5 mg by mouth daily.    Historical Provider, MD  hydrochlorothiazide (HYDRODIURIL) 25 MG tablet TAKE 1 TABLET BY MOUTH EVERY MORNING    Melissa R Smith, PA-C  levothyroxine (SYNTHROID, LEVOTHROID) 50 MCG tablet TAKE 1 TABLET BY MOUTH ONCE DAILY    Melissa R Smith, PA-C  lisinopril (PRINIVIL,ZESTRIL) 10 MG tablet Take 1 tablet (10 mg total) by mouth daily. 10/18/13   Vicie Mutters, PA-C  meclizine (ANTIVERT) 25 MG tablet TAKE 1/2 TO 1 TABLET BY MOUTH THREE TIMES DAILY AS NEEDED FOR VERTIGO    Melissa R Smith, PA-C  metFORMIN (GLUCOPHAGE-XR) 500 MG 24 hr tablet TAKE 1 TO 2 TABLETS BY MOUTH TWICE DAILY AFTER A MEAL    Melissa R Smith, PA-C  metFORMIN (GLUMETZA) 500 MG (MOD) 24  hr tablet Take 500-1,000 mg by mouth daily with breakfast. 1 tablet in the morning  2 tablets at night    Historical Provider, MD  Multiple Vitamin (MULITIVITAMIN WITH MINERALS) TABS Take 1 tablet by mouth daily.    Historical Provider, MD  nitrofurantoin (MACRODANTIN) 100 MG capsule Take 100 mg by mouth 2 (two) times daily.    Historical Provider, MD  omega-3 acid ethyl esters (LOVAZA) 1 G capsule Take 1 g by mouth daily.    Historical Provider, MD  traMADol (ULTRAM) 50 MG tablet TAKE 1 TABLET BY MOUTH THREE TIMES DAILY AS NEEDED    Melissa R Smith, PA-C  vitamin E 400 UNIT capsule Take 400 Units by mouth daily.    Historical Provider, MD  ZETIA 10 MG tablet TAKE 1  TABLET BY MOUTH EVERY DAY    Melissa R Smith, PA-C    ALLERGIES:  Allergies  Allergen Reactions  . Codeine Rash  . Macrobid [Nitrofurantoin Macrocrystal] Rash    SOCIAL HISTORY:  History  Substance Use Topics  . Smoking status: Former Research scientist (life sciences)  . Smokeless tobacco: Not on file  . Alcohol Use: No    FAMILY HISTORY: Family History  Problem Relation Age of Onset  . Heart disease Mother   . Cancer Other   . Heart attack Other     EXAM: BP 150/63  Pulse 89  Temp(Src) 98.6 F (37 C) (Oral)  Resp 18  Ht 5\' 2"  (1.575 m)  Wt 161 lb (73.029 kg)  BMI 29.44 kg/m2  SpO2 91% CONSTITUTIONAL: Alert and oriented and responds appropriately to questions. Well-appearing; well-nourished HEAD: Normocephalic EYES: Conjunctivae clear, PERRL ENT: normal nose; no rhinorrhea; moist mucous membranes; pharynx without lesions noted NECK: Supple, no meningismus, no LAD  CARD: RRR; S1 and S2 appreciated; no murmurs, no clicks, no rubs, no gallops RESP: Normal chest excursion without splinting or tachypnea; breath sounds equal bilaterally patient does have bibasilar crackles, no wheezing or rhonchi, lesions oxygen saturation is 89% on room air, no respiratory distress ABD/GI: Normal bowel sounds; non-distended; soft, non-tender, no rebound, no guarding BACK:  The back appears normal and is non-tender to palpation, there is no CVA tenderness EXT: Normal ROM in all joints; non-tender to palpation; no edema; normal capillary refill; no cyanosis    SKIN: Normal color for age and race; warm NEURO: Moves all extremities equally PSYCH: The patient's mood and manner are appropriate. Grooming and personal hygiene are appropriate.  MEDICAL DECISION MAKING: Patient here with hyperglycemia. She denies missing any medications or have any change in diet. She is well-appearing, nontoxic.  She has no current complaints. Her blood glucoses in the 250s here in the emergency department. Basic blood work was drawn in  triage is a leukocytosis of 28.3 and a potassium of 6.3. Her EKG shows no QRS widening or T-wave changes. We'll give IV fluids. Given her leukocytosis and new oxygen requirement, concern for possible pneumonia. She denies feeling short of breath or having chest pain but states she has had a cough. We'll obtain a chest x-ray as well as blood cultures, urine and urine culture, lactate. Given new oxygen requirement, patient will likely need admission.  ED PROGRESS: Patient has a nitrite positive urinary tract infection. Cultures pending. Her lactate is slightly elevated. Chest x-ray shows a possible right basilar airspace opacity that may be atelectasis versus infiltrate. We'll treat with ceftriaxone and azithromycin to cover her UTI and possible pneumonia. Given her new oxygen requirement, will admit to the hospitalist.  EKG Interpretation  Date/Time:  Wednesday May 10 2014 19:47:37 EDT Ventricular Rate:  86 PR Interval:  185 QRS Duration: 88 QT Interval:  341 QTC Calculation: 408 R Axis:   69 Text Interpretation:  Age not entered, assumed to be  80 years old for purpose of ECG interpretation Sinus rhythm Abnormal R-wave progression, early transition No significant change since last tracing Confirmed by WARD,  DO, KRISTEN [phone]) on 05/10/2014 7:57:06 PM         Keys, DO 05/10/14 2217

## 2014-05-10 NOTE — ED Notes (Signed)
Pt reports that her CBG has been high today. Reports that the last one was 320. States that she takes Metformin. Denies any other complaints at this time.

## 2014-05-10 NOTE — H&P (Signed)
PCP:   [name], MD   Chief Complaint:  Confused, sugar high  HPI: 80 yo female h/o niddm, htn comes in with son for several days of confusion and her glucose much higher than normal.  Lives alone but her son checks in on her frequently.  He has noticed she has been confused.  She says she has been having dysuria for several days.  Denies fevers.  No n/v/d.  She endorses suprapubic pain for several days.  Mild cough.  No swelling.  No rashes.  Her son really got worried when her sugar started climbing up further and further, he says it never gets this high, it was over 300 today.  No slurred speech or focal weakness anywhere, although pt does say she has not been as active as normal because she is sleeping more and more tired than usual.  Review of Systems:  Positive and negative as per HPI otherwise all other systems are negative  Past Medical History: Past Medical History  Diagnosis Date  . Type II or unspecified type diabetes mellitus without mention of complication, not stated as uncontrolled   . Hypertension   . Hyperlipidemia   . Neuropathy   . Hypothyroidism   . Anemia   . Glaucoma    Past Surgical History  Procedure Laterality Date  . Breast surgery    . Cholecystectomy    . Thyroidectomy      Medications: Prior to Admission medications   Medication Sig Start Date End Date Taking? Authorizing Provider  ALPRAZolam Duanne Moron) 0.5 MG tablet Take 0.5 mg by mouth at bedtime as needed for anxiety.   Yes Historical Provider, MD  amLODipine (NORVASC) 5 MG tablet Take 5 mg by mouth daily.   Yes Historical Provider, MD  aspirin EC 81 MG tablet Take 81 mg by mouth daily.   Yes Historical Provider, MD  bimatoprost (LUMIGAN) 0.01 % SOLN Place 1 drop into both eyes at bedtime.   Yes Historical Provider, MD  CALCIUM-MAGNESIUM-ZINC PO Take 1 tablet by mouth daily.   Yes Historical Provider, MD  Carbonyl Iron (PERFECT IRON PO) Take 1 tablet by mouth daily.   Yes Historical  Provider, MD  cycloSPORINE (RESTASIS) 0.05 % ophthalmic emulsion Place 1 drop into both eyes at bedtime.    Yes Historical Provider, MD  dorzolamide-timolol (COSOPT) 22.3-6.8 MG/ML ophthalmic solution Place 1 drop into both eyes 2 (two) times daily.  03/21/14  Yes Historical Provider, MD  ezetimibe (ZETIA) 10 MG tablet Take 10 mg by mouth daily.   Yes Historical Provider, MD  gabapentin (NEURONTIN) 300 MG capsule Take 300-900 mg by mouth 3 (three) times daily. Take 1 capsule (300 mg) in the am,lunch and 3 capsules (900 mg) at bedtime   Yes Historical Provider, MD  hydrochlorothiazide (HYDRODIURIL) 25 MG tablet Take 25 mg by mouth every morning.   Yes Historical Provider, MD  levothyroxine (SYNTHROID, LEVOTHROID) 50 MCG tablet Take 50 mcg by mouth daily before breakfast.   Yes Historical Provider, MD  lisinopril (PRINIVIL,ZESTRIL) 10 MG tablet Take 1 tablet (10 mg total) by mouth daily. 10/18/13  Yes Vicie Mutters, PA-C  meclizine (ANTIVERT) 25 MG tablet Take 12.5-25 mg by mouth 3 (three) times daily as needed (vertigo).   Yes Historical Provider, MD  metFORMIN (GLUCOPHAGE-XR) 500 MG 24 hr tablet Take 500-1,000 mg by mouth 2 (two) times daily after a meal.   Yes Historical Provider, MD  Multiple Vitamin (MULITIVITAMIN WITH MINERALS) TABS Take 1 tablet by mouth daily.  Yes Historical Provider, MD  omega-3 acid ethyl esters (LOVAZA) 1 G capsule Take 1 g by mouth daily.   Yes Historical Provider, MD  traMADol (ULTRAM) 50 MG tablet Take 50 mg by mouth 3 (three) times daily as needed (pain).   Yes Historical Provider, MD  vitamin E 400 UNIT capsule Take 400 Units by mouth daily.   Yes Historical Provider, MD  White Petrolatum-Mineral Oil (GENTEAL PM OP) Place 1 drop into both eyes at bedtime.   Yes Historical Provider, MD    Allergies:   Allergies  Allergen Reactions  . Codeine Rash  . Macrobid [Nitrofurantoin Macrocrystal] Rash    Social History:  reports that she has quit smoking. She does not  have any smokeless tobacco history on file. She reports that she does not drink alcohol or use illicit drugs.  Family History: Family History  Problem Relation Age of Onset  . Heart disease Mother   . Cancer Other   . Heart attack Other     Physical Exam: Filed Vitals:   05/10/14 2115 05/10/14 2130 05/10/14 2145 05/10/14 2200  BP: 120/66 109/93 125/63 114/53  Pulse: 91 90 87 83  Temp:      TempSrc:      Resp: 18 15 11 14   Height:      Weight:      SpO2: 98% 94% 97% 99%   General appearance: alert, cooperative, no distress and slowed mentation Head: Normocephalic, without obvious abnormality, atraumatic Eyes: negative Nose: Nares normal. Septum midline. Mucosa normal. No drainage or sinus tenderness. Neck: no JVD and supple, symmetrical, trachea midline Lungs: clear to auscultation bilaterally Heart: regular rate and rhythm, S1, S2 normal, no murmur, click, rub or gallop Abdomen: soft, non-tender; bowel sounds normal; no masses,  no organomegaly Extremities: extremities normal, atraumatic, no cyanosis or edema Pulses: 2+ and symmetric Skin: Skin color, texture, turgor normal. No rashes or lesions Neurologic: Grossly normal  Labs on Admission:   Recent Labs  05/10/14 1755  NA 132*  K 6.3*  CL 94*  CO2 25  GLUCOSE 277*  BUN 35*  CREATININE 1.41*  CALCIUM 10.3    Recent Labs  05/10/14 1755  AST 36  ALT 24  ALKPHOS 76  BILITOT 0.6  PROT 7.3  ALBUMIN 3.4*    Recent Labs  05/10/14 1755  WBC 28.3*  HGB 12.4  HCT 36.3  MCV 94.3  PLT 241   Radiological Exams on Admission: Dg Chest Port 1 View  05/10/2014   CLINICAL DATA:  Hypoxia; leukocytosis.  History of smoking.  EXAM: PORTABLE CHEST - 1 VIEW  COMPARISON:  Chest radiograph performed 12/31/2012  FINDINGS: The lungs are well-aerated. Mild right basilar opacity may reflect atelectasis. The lungs are otherwise clear. No pleural effusion or pneumothorax is seen.  The cardiomediastinal silhouette is mildly  enlarged. Scattered clips are seen overlying the left thyroid bed. No acute osseous abnormalities are seen. A calcification overlying the left humeral head likely reflects left-sided calcific tendinitis. A clip is seen at the left axilla.  IMPRESSION: 1. Mild right basilar airspace opacity may reflect atelectasis; lungs otherwise clear. 2. Mild cardiomegaly. 3. Calcification overlying the left humeral head likely reflects left-sided calcific tendinitis.   Electronically Signed   By: Garald Balding M.D.   On: 05/10/2014 21:47    Assessment/Plan  80 yo female with uti and probable pna  Principal Problem:   Community acquired pneumonia  pna pathway.  Iv rocephin and azithromycin.  Active Problems:   HTN (  hypertension), benign  Hold bp meds due to soft bp   Type II or unspecified type diabetes mellitus without mention of complication, not stated as uncontrolled   Acute renal failure-  Ivf, reck in am   Hyponatremia  ivf   Hyperkalemia  ivf   UTI (lower urinary tract infection)  rocephin   Hypoxia  Pt agrees to stay overnight but is very relunctant.  Will likely improve with ivf and abx in next couple days.  Rachal A Shanon Brow 05/10/2014, 10:18 PM

## 2014-05-11 ENCOUNTER — Encounter (HOSPITAL_COMMUNITY): Payer: Self-pay | Admitting: General Practice

## 2014-05-11 DIAGNOSIS — D649 Anemia, unspecified: Secondary | ICD-10-CM

## 2014-05-11 DIAGNOSIS — R109 Unspecified abdominal pain: Secondary | ICD-10-CM

## 2014-05-11 LAB — CBC WITH DIFFERENTIAL/PLATELET
Basophils Absolute: 0 10*3/uL (ref 0.0–0.1)
Basophils Relative: 0 % (ref 0–1)
Eosinophils Absolute: 0.1 10*3/uL (ref 0.0–0.7)
Eosinophils Relative: 0 % (ref 0–5)
HEMATOCRIT: 29.8 % — AB (ref 36.0–46.0)
Hemoglobin: 10 g/dL — ABNORMAL LOW (ref 12.0–15.0)
Lymphocytes Relative: 17 % (ref 12–46)
Lymphs Abs: 3.5 10*3/uL (ref 0.7–4.0)
MCH: 31.8 pg (ref 26.0–34.0)
MCHC: 33.6 g/dL (ref 30.0–36.0)
MCV: 94.9 fL (ref 78.0–100.0)
MONOS PCT: 7 % (ref 3–12)
Monocytes Absolute: 1.4 10*3/uL — ABNORMAL HIGH (ref 0.1–1.0)
NEUTROS PCT: 76 % (ref 43–77)
Neutro Abs: 15.8 10*3/uL — ABNORMAL HIGH (ref 1.7–7.7)
Platelets: 198 10*3/uL (ref 150–400)
RBC: 3.14 MIL/uL — AB (ref 3.87–5.11)
RDW: 13.6 % (ref 11.5–15.5)
WBC: 20.7 10*3/uL — ABNORMAL HIGH (ref 4.0–10.5)

## 2014-05-11 LAB — BASIC METABOLIC PANEL
BUN: 39 mg/dL — ABNORMAL HIGH (ref 6–23)
CHLORIDE: 98 meq/L (ref 96–112)
CO2: 26 meq/L (ref 19–32)
Calcium: 9.2 mg/dL (ref 8.4–10.5)
Creatinine, Ser: 1.38 mg/dL — ABNORMAL HIGH (ref 0.50–1.10)
GFR calc Af Amer: 40 mL/min — ABNORMAL LOW (ref >90)
GFR calc non Af Amer: 34 mL/min — ABNORMAL LOW (ref >90)
GLUCOSE: 135 mg/dL — AB (ref 70–99)
POTASSIUM: 4.6 meq/L (ref 3.7–5.3)
SODIUM: 134 meq/L — AB (ref 137–147)

## 2014-05-11 LAB — HIV ANTIBODY (ROUTINE TESTING W REFLEX): HIV 1&2 Ab, 4th Generation: NONREACTIVE

## 2014-05-11 LAB — GLUCOSE, CAPILLARY
Glucose-Capillary: 122 mg/dL — ABNORMAL HIGH (ref 70–99)
Glucose-Capillary: 139 mg/dL — ABNORMAL HIGH (ref 70–99)
Glucose-Capillary: 145 mg/dL — ABNORMAL HIGH (ref 70–99)
Glucose-Capillary: 148 mg/dL — ABNORMAL HIGH (ref 70–99)
Glucose-Capillary: 265 mg/dL — ABNORMAL HIGH (ref 70–99)

## 2014-05-11 LAB — CREATININE, SERUM
Creatinine, Ser: 1.44 mg/dL — ABNORMAL HIGH (ref 0.50–1.10)
GFR calc Af Amer: 38 mL/min — ABNORMAL LOW (ref >90)
GFR calc non Af Amer: 33 mL/min — ABNORMAL LOW (ref >90)

## 2014-05-11 LAB — PROCALCITONIN: Procalcitonin: 1.08 ng/mL

## 2014-05-11 LAB — STREP PNEUMONIAE URINARY ANTIGEN: Strep Pneumo Urinary Antigen: NEGATIVE

## 2014-05-11 LAB — HEMOGLOBIN A1C
HEMOGLOBIN A1C: 7.4 % — AB (ref <5.7)
Mean Plasma Glucose: 166 mg/dL — ABNORMAL HIGH (ref <117)

## 2014-05-11 MED ORDER — LEVOTHYROXINE SODIUM 50 MCG PO TABS
50.0000 ug | ORAL_TABLET | Freq: Every day | ORAL | Status: DC
  Administered 2014-05-11 – 2014-05-13 (×3): 50 ug via ORAL
  Filled 2014-05-11 (×5): qty 1

## 2014-05-11 MED ORDER — MECLIZINE HCL 12.5 MG PO TABS
12.5000 mg | ORAL_TABLET | Freq: Three times a day (TID) | ORAL | Status: DC | PRN
  Filled 2014-05-11: qty 2

## 2014-05-11 MED ORDER — SODIUM CHLORIDE 0.9 % IV SOLN
INTRAVENOUS | Status: AC
  Administered 2014-05-11: 07:00:00 via INTRAVENOUS
  Administered 2014-05-11: 100 mL/h via INTRAVENOUS

## 2014-05-11 MED ORDER — EZETIMIBE 10 MG PO TABS
10.0000 mg | ORAL_TABLET | Freq: Every day | ORAL | Status: DC
  Administered 2014-05-11 – 2014-05-13 (×3): 10 mg via ORAL
  Filled 2014-05-11 (×3): qty 1

## 2014-05-11 MED ORDER — ASPIRIN EC 81 MG PO TBEC
81.0000 mg | DELAYED_RELEASE_TABLET | Freq: Every day | ORAL | Status: DC
  Administered 2014-05-11 – 2014-05-13 (×3): 81 mg via ORAL
  Filled 2014-05-11 (×3): qty 1

## 2014-05-11 MED ORDER — GABAPENTIN 300 MG PO CAPS
300.0000 mg | ORAL_CAPSULE | Freq: Two times a day (BID) | ORAL | Status: DC
  Administered 2014-05-11 – 2014-05-13 (×5): 300 mg via ORAL
  Filled 2014-05-11 (×8): qty 1

## 2014-05-11 MED ORDER — ALPRAZOLAM 0.5 MG PO TABS
0.5000 mg | ORAL_TABLET | Freq: Every evening | ORAL | Status: DC | PRN
  Administered 2014-05-12: 0.5 mg via ORAL
  Filled 2014-05-11: qty 1

## 2014-05-11 MED ORDER — DORZOLAMIDE HCL-TIMOLOL MAL 2-0.5 % OP SOLN
1.0000 [drp] | Freq: Two times a day (BID) | OPHTHALMIC | Status: DC
  Administered 2014-05-11 – 2014-05-13 (×4): 1 [drp] via OPHTHALMIC
  Filled 2014-05-11: qty 10

## 2014-05-11 MED ORDER — OMEGA-3-ACID ETHYL ESTERS 1 G PO CAPS
1.0000 g | ORAL_CAPSULE | Freq: Every day | ORAL | Status: DC
  Administered 2014-05-11 – 2014-05-13 (×3): 1 g via ORAL
  Filled 2014-05-11 (×3): qty 1

## 2014-05-11 MED ORDER — INSULIN ASPART 100 UNIT/ML ~~LOC~~ SOLN
0.0000 [IU] | Freq: Every day | SUBCUTANEOUS | Status: DC
  Administered 2014-05-11: 3 [IU] via SUBCUTANEOUS

## 2014-05-11 MED ORDER — INSULIN ASPART 100 UNIT/ML ~~LOC~~ SOLN
0.0000 [IU] | Freq: Three times a day (TID) | SUBCUTANEOUS | Status: DC
  Administered 2014-05-11 (×3): 2 [IU] via SUBCUTANEOUS
  Administered 2014-05-12: 3 [IU] via SUBCUTANEOUS
  Administered 2014-05-12: 2 [IU] via SUBCUTANEOUS
  Administered 2014-05-12 – 2014-05-13 (×2): 3 [IU] via SUBCUTANEOUS

## 2014-05-11 MED ORDER — GABAPENTIN 300 MG PO CAPS
900.0000 mg | ORAL_CAPSULE | Freq: Every day | ORAL | Status: DC
  Administered 2014-05-11 – 2014-05-12 (×3): 900 mg via ORAL
  Filled 2014-05-11 (×4): qty 3

## 2014-05-11 MED ORDER — ADULT MULTIVITAMIN W/MINERALS CH
1.0000 | ORAL_TABLET | Freq: Every day | ORAL | Status: DC
  Administered 2014-05-11 – 2014-05-13 (×3): 1 via ORAL
  Filled 2014-05-11 (×3): qty 1

## 2014-05-11 MED ORDER — SODIUM CHLORIDE 0.9 % IV SOLN
INTRAVENOUS | Status: DC
  Administered 2014-05-10: 23:00:00 via INTRAVENOUS

## 2014-05-11 MED ORDER — CYCLOSPORINE 0.05 % OP EMUL
1.0000 [drp] | Freq: Every day | OPHTHALMIC | Status: DC
  Administered 2014-05-11 – 2014-05-12 (×2): 1 [drp] via OPHTHALMIC
  Filled 2014-05-11 (×3): qty 1

## 2014-05-11 MED ORDER — BIOTENE DRY MOUTH MT LIQD
15.0000 mL | Freq: Two times a day (BID) | OROMUCOSAL | Status: DC
  Administered 2014-05-11 – 2014-05-13 (×3): 15 mL via OROMUCOSAL

## 2014-05-11 MED ORDER — ENOXAPARIN SODIUM 40 MG/0.4ML ~~LOC~~ SOLN
40.0000 mg | SUBCUTANEOUS | Status: DC
  Administered 2014-05-11 – 2014-05-13 (×3): 40 mg via SUBCUTANEOUS
  Filled 2014-05-11 (×3): qty 0.4

## 2014-05-11 MED ORDER — VITAMIN E 180 MG (400 UNIT) PO CAPS
400.0000 [IU] | ORAL_CAPSULE | Freq: Every day | ORAL | Status: DC
  Administered 2014-05-11 – 2014-05-13 (×3): 400 [IU] via ORAL
  Filled 2014-05-11 (×3): qty 1

## 2014-05-11 MED ORDER — DEXTROSE 5 % IV SOLN
500.0000 mg | INTRAVENOUS | Status: DC
  Administered 2014-05-11: 500 mg via INTRAVENOUS
  Filled 2014-05-11 (×2): qty 500

## 2014-05-11 MED ORDER — INSULIN ASPART 100 UNIT/ML ~~LOC~~ SOLN: 0.0000 [IU] | Freq: Three times a day (TID) | SUBCUTANEOUS | Status: DC

## 2014-05-11 MED ORDER — DEXTROSE 5 % IV SOLN
1.0000 g | INTRAVENOUS | Status: DC
  Administered 2014-05-11: 1 g via INTRAVENOUS
  Filled 2014-05-11 (×2): qty 10

## 2014-05-11 NOTE — Progress Notes (Signed)
Noted pt on Metformin XR with concern for elevated Creatinine. Pt may need be monitored after discharge re creatinine levels due to potential for lactic acidosis. Thank you, Rosita Kea, RN, CNS, Diabetes Coordinator [phone])

## 2014-05-11 NOTE — Evaluation (Signed)
Physical Therapy Evaluation Patient Details Name: Melissa Mccullough MRN: [id number] DOB: [birth year] Today's Date: 05/11/2014   History of Present Illness  Pt admitted with hyperglycemia, confusion, UTI and CAP  Clinical Impression  Pt very pleasant and having difficulty figuring out how to perform pericare with right hand IV (RHD) on arrival and required assist. Pt oriented with cues for safety and mobility throughout session as well as education for breathing technique. Pt with necessary DME and son who checks in on her but no 24hr assist. Would recommend HHPT safety eval, life line or equivalent if pt doesn't already have one and continued acute therapy to maximize balance and gait to decrease fall risk to address below deficits.     Follow Up Recommendations Home health PT;Supervision - Intermittent    Equipment Recommendations  None recommended by PT    Recommendations for Other Services       Precautions / Restrictions Precautions Precautions: Fall      Mobility  Bed Mobility               General bed mobility comments: pt in bathroom on arrival  Transfers Overall transfer level: Needs assistance   Transfers: Sit to/from Stand Sit to Stand: Min assist         General transfer comment: cues for hand placement, assist to stand from low commode with rail. Supervision to sit  Ambulation/Gait Ambulation/Gait assistance: Supervision Ambulation Distance (Feet): 300 Feet Assistive device: Rolling walker (2 wheeled) Gait Pattern/deviations: Step-through pattern;Decreased stride length;Trunk flexed   Gait velocity interpretation: Below normal speed for age/gender General Gait Details: cues for posture, position in RW and directional cues, pt veers very near objects and needs cues to avoid hitting people  Stairs            Wheelchair Mobility    Modified Rankin (Stroke Patients Only)       Balance Overall balance assessment: Needs assistance   Sitting  balance-Leahy Scale: Good       Standing balance-Leahy Scale: Poor                               Pertinent Vitals/Pain No pain sats 90% on RA HR 64-84 with activity    Home Living Family/patient expects to be discharged to:: Private residence Living Arrangements: Alone Available Help at Discharge: Family;Available PRN/intermittently Type of Home: House Home Access: Stairs to enter   Entrance Stairs-Number of Steps: threshhold Home Layout: One level Home Equipment: Walker - 2 wheels;Cane - single point;Shower seat Additional Comments: Son comes by 3x/wk    Prior Function Level of Independence: Independent with assistive device(s)         Comments: pt typically uses a cane. She performs housework and cooking and she only leaves if son comes to drive her. She calls her son Melissa Mccullough before and after she gets out of the shower to let him know she's safe     Hand Dominance        Extremity/Trunk Assessment   Upper Extremity Assessment: Generalized weakness           Lower Extremity Assessment: Generalized weakness      Cervical / Trunk Assessment: Normal  Communication   Communication: No difficulties  Cognition Arousal/Alertness: Awake/alert Behavior During Therapy: WFL for tasks assessed/performed Overall Cognitive Status: No family/caregiver present to determine baseline cognitive functioning       Memory: Decreased short-term memory  General Comments General comments (skin integrity, edema, etc.): pt reports only one fall in the last year    Exercises General Exercises - Lower Extremity Ankle Circles/Pumps: AROM;Both;10 reps Heel Slides: AROM;Seated;Both;10 reps Hip ABduction/ADduction: AROM;Seated;Both;10 reps      Assessment/Plan    PT Assessment Patient needs continued PT services  PT Diagnosis Difficulty walking;Altered mental status   PT Problem List Decreased strength;Decreased cognition;Decreased activity  tolerance;Decreased safety awareness;Decreased knowledge of use of DME;Decreased balance  PT Treatment Interventions Gait training;DME instruction;Functional mobility training;Therapeutic activities;Therapeutic exercise;Patient/family education   PT Goals (Current goals can be found in the Care Plan section) Acute Rehab PT Goals Patient Stated Goal: go home today PT Goal Formulation: With patient Time For Goal Achievement: 05/25/14 Potential to Achieve Goals: Good    Frequency Min 3X/week   Barriers to discharge Decreased caregiver support      Co-evaluation               End of Session Equipment Utilized During Treatment: Gait belt Activity Tolerance: Patient tolerated treatment well Patient left: in chair;with call bell/phone within reach Nurse Communication: Mobility status         Time: 1353-1417 PT Time Calculation (min): 24 min   Charges:   PT Evaluation $Initial PT Evaluation Tier I: 1 Procedure PT Treatments $Therapeutic Activity: 8-22 mins   PT G Codes:          Sylvanus Telford B Linus Weckerly 05/11/2014, 3:17 PM Elwyn Reach, [place]

## 2014-05-11 NOTE — Progress Notes (Signed)
Patient ID: Melissa Mccullough  female  ZOX:[id number]    DOB: February 26, 1928    DOA: 05/10/2014  PCP: [name], MD  Assessment/Plan: Principal Problem:   Community acquired pneumonia - Continue IV Zithromax and Rocephin - Procalcitonin 1.0, urine strep antigen negative  Active Problems: UTI:  - Follow urine culture and sensitivities, continue IV Rocephin  Acute renal failure with hyponatremia, hyperkalemia  - Cr has improved with IV fluids, continue gentle hydration, creatinine function improving     HTN (hypertension), benign -BP borderline, continue IV fluid hydration      Type II or unspecified type diabetes mellitus without mention of complication, not stated as uncontrolled - Continue sliding scale insulin   DVT Prophylaxis: SCDs   Code Status: full code   Family Communication: discussed in detail with patient's daughter, Melissa Mccullough at the bedside   Disposition: patient lives alone, will start physical therapy  Consultants:  None   Procedures:  None   Antibiotics:  IV Zithromax    IV Rocephin   Subjective: Patient seen and examined, feeling slightly better, lives alone, no fever chills, chest pain or shortness of breath, nausea, tolerating food without any difficulty or coughing   Objective: Weight change:   Intake/Output Summary (Last 24 hours) at 05/11/14 1125 Last data filed at 05/11/14 0900  Gross per 24 hour  Intake   1290 ml  Output    345 ml  Net    945 ml   Blood pressure 87/42, pulse 66, temperature 97.4 F (36.3 C), temperature source Oral, resp. rate 16, height 5\' 2"  (1.575 m), weight 75.161 kg (165 lb 11.2 oz), SpO2 96.00%.  Physical Exam: General: Alert and awake, oriented x3, not in any acute distress. CVS: S1-S2 clear, no murmur rubs or gallops Chest:  decreased breath sound at the bases  Abdomen: soft nontender, nondistended, normal bowel sounds  Extremities: no cyanosis, clubbing or edema noted bilaterally Neuro: Cranial  nerves II-XII intact, no focal neurological deficits  Lab Results: Basic Metabolic Panel:  Recent Labs Lab 05/10/14 1755 05/10/14 2003 05/11/14 0443  NA 132*  --  134*  K 6.3* 5.1 4.6  CL 94*  --  98  CO2 25  --  26  GLUCOSE 277*  --  135*  BUN 35*  --  39*  CREATININE 1.41*  --  1.44*  1.38*  CALCIUM 10.3  --  9.2   Liver Function Tests:  Recent Labs Lab 05/10/14 1755  AST 36  ALT 24  ALKPHOS 76  BILITOT 0.6  PROT 7.3  ALBUMIN 3.4*   No results found for this basename: LIPASE, AMYLASE,  in the last 168 hours No results found for this basename: AMMONIA,  in the last 168 hours CBC:  Recent Labs Lab 05/10/14 1755 05/11/14 0443  WBC 28.3* 20.7*  NEUTROABS  --  15.8*  HGB 12.4 10.0*  HCT 36.3 29.8*  MCV 94.3 94.9  PLT 241 198   Cardiac Enzymes: No results found for this basename: CKTOTAL, CKMB, CKMBINDEX, TROPONINI,  in the last 168 hours BNP: No components found with this basename: POCBNP,  CBG:  Recent Labs Lab 05/10/14 1812 05/10/14 2219 05/11/14 0009 05/11/14 0740  GLUCAP 254* 230* 265* 122*     Micro Results: No results found for this or any previous visit (from the past 240 hour(s)).  Studies/Results: Dg Chest Port 1 View  05/10/2014   CLINICAL DATA:  Hypoxia; leukocytosis.  History of smoking.  EXAM: PORTABLE CHEST - 1 VIEW  COMPARISON:  Chest radiograph performed 12/31/2012  FINDINGS: The lungs are well-aerated. Mild right basilar opacity may reflect atelectasis. The lungs are otherwise clear. No pleural effusion or pneumothorax is seen.  The cardiomediastinal silhouette is mildly enlarged. Scattered clips are seen overlying the left thyroid bed. No acute osseous abnormalities are seen. A calcification overlying the left humeral head likely reflects left-sided calcific tendinitis. A clip is seen at the left axilla.  IMPRESSION: 1. Mild right basilar airspace opacity may reflect atelectasis; lungs otherwise clear. 2. Mild cardiomegaly. 3.  Calcification overlying the left humeral head likely reflects left-sided calcific tendinitis.   Electronically Signed   By: Garald Balding M.D.   On: 05/10/2014 21:47    Medications: Scheduled Meds: . aspirin EC  81 mg Oral Daily  . azithromycin  500 mg Intravenous Q24H  . cefTRIAXone (ROCEPHIN)  IV  1 g Intravenous Q24H  . cycloSPORINE  1 drop Both Eyes QHS  . dorzolamide-timolol  1 drop Both Eyes BID  . enoxaparin (LOVENOX) injection  40 mg Subcutaneous Q24H  . ezetimibe  10 mg Oral Daily  . gabapentin  300 mg Oral BID WC  . gabapentin  900 mg Oral QHS  . insulin aspart  0-15 Units Subcutaneous TID WC  . insulin aspart  0-5 Units Subcutaneous QHS  . levothyroxine  50 mcg Oral QAC breakfast  . multivitamin with minerals  1 tablet Oral Daily  . omega-3 acid ethyl esters  1 g Oral Daily  . vitamin E  400 Units Oral Daily      LOS: 1 day   Habib Kise Krystal Eaton M.D. [facility] 05/11/2014, 11:25 AM Pager: [phone]  If 7PM-7AM, please contact night-coverage [email or web address] Password [email or web address]  **Disclaimer: This note was dictated with voice recognition software. Similar sounding words can inadvertently be transcribed and this note may contain transcription errors which may not have been corrected upon publication of note.**

## 2014-05-11 NOTE — Progress Notes (Signed)
UR completed 

## 2014-05-12 DIAGNOSIS — A419 Sepsis, unspecified organism: Secondary | ICD-10-CM

## 2014-05-12 LAB — CBC
HCT: 35.4 % — ABNORMAL LOW (ref 36.0–46.0)
Hemoglobin: 12 g/dL (ref 12.0–15.0)
MCH: 31.7 pg (ref 26.0–34.0)
MCHC: 33.9 g/dL (ref 30.0–36.0)
MCV: 93.7 fL (ref 78.0–100.0)
Platelets: 213 10*3/uL (ref 150–400)
RBC: 3.78 MIL/uL — ABNORMAL LOW (ref 3.87–5.11)
RDW: 13.3 % (ref 11.5–15.5)
WBC: 12 10*3/uL — ABNORMAL HIGH (ref 4.0–10.5)

## 2014-05-12 LAB — BASIC METABOLIC PANEL
BUN: 22 mg/dL (ref 6–23)
CALCIUM: 9.5 mg/dL (ref 8.4–10.5)
CHLORIDE: 104 meq/L (ref 96–112)
CO2: 25 mEq/L (ref 19–32)
CREATININE: 0.74 mg/dL (ref 0.50–1.10)
GFR calc non Af Amer: 77 mL/min — ABNORMAL LOW (ref >90)
GFR, EST AFRICAN AMERICAN: 89 mL/min — AB (ref >90)
Glucose, Bld: 162 mg/dL — ABNORMAL HIGH (ref 70–99)
Potassium: 4.5 mEq/L (ref 3.7–5.3)
Sodium: 140 mEq/L (ref 137–147)

## 2014-05-12 LAB — URINE CULTURE: Colony Count: >=100000

## 2014-05-12 LAB — GLUCOSE, CAPILLARY
GLUCOSE-CAPILLARY: 151 mg/dL — AB (ref 70–99)
Glucose-Capillary: 136 mg/dL — ABNORMAL HIGH (ref 70–99)
Glucose-Capillary: 182 mg/dL — ABNORMAL HIGH (ref 70–99)
Glucose-Capillary: 182 mg/dL — ABNORMAL HIGH (ref 70–99)

## 2014-05-12 LAB — LEGIONELLA ANTIGEN, URINE: LEGIONELLA ANTIGEN, URINE: NEGATIVE

## 2014-05-12 MED ORDER — DEXTROSE 5 % IV SOLN
500.0000 mg | INTRAVENOUS | Status: DC
  Administered 2014-05-12: 500 mg via INTRAVENOUS
  Filled 2014-05-12: qty 500

## 2014-05-12 MED ORDER — DEXTROSE 5 % IV SOLN
1.0000 g | INTRAVENOUS | Status: DC
  Administered 2014-05-12: 1 g via INTRAVENOUS
  Filled 2014-05-12: qty 10

## 2014-05-12 MED ORDER — AMLODIPINE BESYLATE 5 MG PO TABS
5.0000 mg | ORAL_TABLET | Freq: Every day | ORAL | Status: DC
  Administered 2014-05-12 – 2014-05-13 (×2): 5 mg via ORAL
  Filled 2014-05-12 (×2): qty 1

## 2014-05-12 MED ORDER — LISINOPRIL 10 MG PO TABS: 10.0000 mg | ORAL_TABLET | Freq: Every day | ORAL | Status: DC

## 2014-05-12 MED ORDER — CEFUROXIME AXETIL 500 MG PO TABS
500.0000 mg | ORAL_TABLET | Freq: Two times a day (BID) | ORAL | Status: DC
  Filled 2014-05-12 (×2): qty 1

## 2014-05-12 MED ORDER — CEFUROXIME AXETIL 500 MG PO TABS: 500.0000 mg | ORAL_TABLET | Freq: Two times a day (BID) | ORAL | Status: DC

## 2014-05-12 MED ORDER — LEVOFLOXACIN 750 MG PO TABS
750.0000 mg | ORAL_TABLET | Freq: Every day | ORAL | Status: DC
  Filled 2014-05-12: qty 1

## 2014-05-12 MED ORDER — AZITHROMYCIN 500 MG PO TABS: 500.0000 mg | ORAL_TABLET | Freq: Every day | ORAL | Status: DC

## 2014-05-12 MED ORDER — AZITHROMYCIN 500 MG PO TABS
500.0000 mg | ORAL_TABLET | Freq: Once | ORAL | Status: DC
  Filled 2014-05-12: qty 1

## 2014-05-12 NOTE — Care Management Note (Addendum)
    Page 1 of 1   05/14/2014     8:51:00 AM CARE MANAGEMENT NOTE 05/14/2014  Patient:  LUVIA, ORZECHOWSKI   Account Number:  [id number]  Date Initiated:  05/12/2014  Documentation initiated by:  GRAVES-BIGELOW,BRENDA  Subjective/Objective Assessment:   Pt admitted for hyperglycemia, confusion, UTI and CAP. Plan for d/c home today with [place]. Family agreeable to [facility] for services.     Action/Plan:   CM did make referral with [facility]. SOC to begin within 24-48 hours of d/c. Pt states she has lifeline and RW/Cane. No DME needs.   Anticipated DC Date:  05/12/2014   Anticipated DC Plan:  [place]  CM consult      Choice offered to / List presented to:          [facility] arranged  HH-1 RN  [place]      [place].   Status of service:  Completed, signed off Medicare Important Message given?  YES (If response is "NO", the following Medicare IM given date fields will be blank) Date Medicare IM given:  05/10/2014 Date Additional Medicare IM given:  05/12/2014  Discharge Disposition:  [place]  Per UR Regulation:  Reviewed for med. necessity/level of care/duration of stay  If discussed at [place] of Stay Meetings, dates discussed:    Comments:  05/14/14 08:46 CM notified [facility] rep, Colletta [place] of pt discharge.  No other CM needs were communicated.  Mariane Masters, BSN, CM [phone].

## 2014-05-12 NOTE — Progress Notes (Signed)
Patient ID: Melissa Mccullough  female  DXI:[id number]    DOB: 1928-05-17    DOA: 05/10/2014  PCP: [name], MD  Assessment/Plan: Principal Problem:   Community acquired pneumonia - Continue IV Zithromax and Rocephin, day #2 - Procalcitonin 1.0, urine strep antigen negative  Active Problems: UTI:  - Follow urine culture and sensitivities, continue IV Rocephin. I called the [facility] microbiology lab, they do not have sensitivities today, it is now showing as more than 100,000 colonies of gram-negative rods. Will continue IV Rocephin until the sensitivities are available.  Acute renal failure with hyponatremia, hyperkalemia : Resolved with IV fluid hydration - Cr has improved with IV fluids, continue gentle hydration, creatinine function improved    HTN (hypertension), benign -BP borderline, continue IV fluid hydration      Type II or unspecified type diabetes mellitus without mention of complication, not stated as uncontrolled - Continue sliding scale insulin   DVT Prophylaxis: SCDs   Code Status: full code   Family Communication: Left detail message on her daughter's phone, Langley Gauss and discussed with patient's son, [name] twice  Disposition: DC home once urine culture and sensitivities are back, likely in AM. I have arranged home PT, OT, RN, home health aide.  Consultants:  None   Procedures:  None   Antibiotics:  IV Zithromax    IV Rocephin   Subjective: Patient seen and examined, feeling a whole lot better, ambulating with walker  Objective: Weight change: 3.992 kg (8 lb 12.8 oz)  Intake/Output Summary (Last 24 hours) at 05/12/14 1231 Last data filed at 05/12/14 1000  Gross per 24 hour  Intake    720 ml  Output   2450 ml  Net  -1730 ml   Blood pressure 155/65, pulse 93, temperature 98.3 F (36.8 C), temperature source Oral, resp. rate 18, height 5\' 2"  (1.575 m), weight 77.021 kg (169 lb 12.8 oz), SpO2 97.00%.  Physical Exam: General: Alert and  awake, oriented x3, not in any acute distress. CVS: S1-S2 clear, no murmur rubs or gallops Chest:  decreased breath sound at the bases  Abdomen: soft nontender, nondistended, normal bowel sounds  Extremities: no cyanosis, clubbing or edema noted bilaterally   Lab Results: Basic Metabolic Panel:  Recent Labs Lab 05/11/14 0443 05/12/14 0720  NA 134* 140  K 4.6 4.5  CL 98 104  CO2 26 25  GLUCOSE 135* 162*  BUN 39* 22  CREATININE 1.44*  1.38* 0.74  CALCIUM 9.2 9.5   Liver Function Tests:  Recent Labs Lab 05/10/14 1755  AST 36  ALT 24  ALKPHOS 76  BILITOT 0.6  PROT 7.3  ALBUMIN 3.4*   No results found for this basename: LIPASE, AMYLASE,  in the last 168 hours No results found for this basename: AMMONIA,  in the last 168 hours CBC:  Recent Labs Lab 05/11/14 0443 05/12/14 1115  WBC 20.7* 12.0*  NEUTROABS 15.8*  --   HGB 10.0* 12.0  HCT 29.8* 35.4*  MCV 94.9 93.7  PLT 198 213   Cardiac Enzymes: No results found for this basename: CKTOTAL, CKMB, CKMBINDEX, TROPONINI,  in the last 168 hours BNP: No components found with this basename: POCBNP,  CBG:  Recent Labs Lab 05/11/14 1147 05/11/14 1549 05/11/14 2046 05/12/14 0730 05/12/14 1151  GLUCAP 139* 145* 148* 136* 182*     Micro Results: Recent Results (from the past 240 hour(s))  CULTURE, BLOOD (ROUTINE X 2)     Status: None   Collection Time  05/10/14  7:58 PM      Result Value Ref Range Status   Specimen Description BLOOD WRIST RIGHT   Final   Special Requests BOTTLES DRAWN AEROBIC AND ANAEROBIC 5CC   Final   Culture  Setup Time     Final   Value: 05/11/2014 00:43     Performed at [facility]   Culture     Final   Value:        BLOOD CULTURE RECEIVED NO GROWTH TO DATE CULTURE WILL BE HELD FOR 5 DAYS BEFORE ISSUING A FINAL NEGATIVE REPORT     Performed at [facility]   Report Status PENDING   Incomplete  URINE CULTURE     Status: None   Collection Time    05/10/14  8:52 PM        Result Value Ref Range Status   Specimen Description URINE, RANDOM   Final   Special Requests NONE   Final   Culture  Setup Time     Final   Value: 05/10/2014 21:49     Performed at [place]     Final   Value: >=100,000 COLONIES/ML     Performed at [facility]   Culture     Final   Value: [place]     Performed at [facility]   Report Status PENDING   Incomplete  CULTURE, BLOOD (ROUTINE X 2)     Status: None   Collection Time    05/10/14  9:10 PM      Result Value Ref Range Status   Specimen Description BLOOD ARM LEFT   Final   Special Requests BOTTLES DRAWN AEROBIC AND ANAEROBIC 5CCBLUE 4CCRED   Final   Culture  Setup Time     Final   Value: 05/11/2014 00:43     Performed at [facility]   Culture     Final   Value:        BLOOD CULTURE RECEIVED NO GROWTH TO DATE CULTURE WILL BE HELD FOR 5 DAYS BEFORE ISSUING A FINAL NEGATIVE REPORT     Performed at [facility]   Report Status PENDING   Incomplete    Studies/Results: Dg Chest Port 1 View  05/10/2014   CLINICAL DATA:  Hypoxia; leukocytosis.  History of smoking.  EXAM: PORTABLE CHEST - 1 VIEW  COMPARISON:  Chest radiograph performed 12/31/2012  FINDINGS: The lungs are well-aerated. Mild right basilar opacity may reflect atelectasis. The lungs are otherwise clear. No pleural effusion or pneumothorax is seen.  The cardiomediastinal silhouette is mildly enlarged. Scattered clips are seen overlying the left thyroid bed. No acute osseous abnormalities are seen. A calcification overlying the left humeral head likely reflects left-sided calcific tendinitis. A clip is seen at the left axilla.  IMPRESSION: 1. Mild right basilar airspace opacity may reflect atelectasis; lungs otherwise clear. 2. Mild cardiomegaly. 3. Calcification overlying the left humeral head likely reflects left-sided calcific tendinitis.   Electronically Signed   By: Garald Balding M.D.   On:  05/10/2014 21:47    Medications: Scheduled Meds: . amLODipine  5 mg Oral Daily  . antiseptic oral rinse  15 mL Mouth Rinse BID  . aspirin EC  81 mg Oral Daily  . cycloSPORINE  1 drop Both Eyes QHS  . dorzolamide-timolol  1 drop Both Eyes BID  . enoxaparin (LOVENOX) injection  40 mg Subcutaneous Q24H  . ezetimibe  10 mg Oral Daily  .  gabapentin  300 mg Oral BID WC  . gabapentin  900 mg Oral QHS  . insulin aspart  0-15 Units Subcutaneous TID WC  . insulin aspart  0-5 Units Subcutaneous QHS  . levofloxacin  750 mg Oral Daily  . levothyroxine  50 mcg Oral QAC breakfast  . multivitamin with minerals  1 tablet Oral Daily  . omega-3 acid ethyl esters  1 g Oral Daily  . vitamin E  400 Units Oral Daily      LOS: 2 days   Omesha Bowerman Krystal Eaton M.D. [facility] 05/12/2014, 12:31 PM Pager: [phone]  If 7PM-7AM, please contact night-coverage [email or web address] Password [email or web address]  **Disclaimer: This note was dictated with voice recognition software. Similar sounding words can inadvertently be transcribed and this note may contain transcription errors which may not have been corrected upon publication of note.**

## 2014-05-12 NOTE — Progress Notes (Signed)
1220 05-12-14 Medicare IM signed by patient and signed copy left on chart. Ocie Cornfield Mansura, RN,BSN [phone]

## 2014-05-13 DIAGNOSIS — I10 Essential (primary) hypertension: Secondary | ICD-10-CM

## 2014-05-13 LAB — GLUCOSE, CAPILLARY: Glucose-Capillary: 185 mg/dL — ABNORMAL HIGH (ref 70–99)

## 2014-05-13 LAB — CBC
HCT: 36.5 % (ref 36.0–46.0)
Hemoglobin: 12.5 g/dL (ref 12.0–15.0)
MCH: 31.5 pg (ref 26.0–34.0)
MCHC: 34.2 g/dL (ref 30.0–36.0)
MCV: 91.9 fL (ref 78.0–100.0)
PLATELETS: 240 10*3/uL (ref 150–400)
RBC: 3.97 MIL/uL (ref 3.87–5.11)
RDW: 13 % (ref 11.5–15.5)
WBC: 11.4 10*3/uL — ABNORMAL HIGH (ref 4.0–10.5)

## 2014-05-13 LAB — BASIC METABOLIC PANEL
BUN: 15 mg/dL (ref 6–23)
CO2: 21 mEq/L (ref 19–32)
Calcium: 9.6 mg/dL (ref 8.4–10.5)
Chloride: 106 mEq/L (ref 96–112)
Creatinine, Ser: 0.65 mg/dL (ref 0.50–1.10)
GFR calc non Af Amer: 80 mL/min — ABNORMAL LOW (ref >90)
Glucose, Bld: 162 mg/dL — ABNORMAL HIGH (ref 70–99)
POTASSIUM: 4 meq/L (ref 3.7–5.3)
SODIUM: 139 meq/L (ref 137–147)

## 2014-05-13 LAB — PROCALCITONIN: Procalcitonin: 0.3 ng/mL

## 2014-05-13 MED ORDER — LEVOFLOXACIN 750 MG PO TABS
750.0000 mg | ORAL_TABLET | Freq: Every day | ORAL | Status: DC
  Administered 2014-05-13: 750 mg via ORAL
  Filled 2014-05-13: qty 1

## 2014-05-13 MED ORDER — LEVOFLOXACIN 750 MG PO TABS: 750.0000 mg | ORAL_TABLET | Freq: Every day | ORAL | Status: DC

## 2014-05-13 NOTE — Progress Notes (Signed)
ANTIBIOTIC CONSULT NOTE - INITIAL  Pharmacy Consult for Levofloxacin Indication: Pneumonia and UTI  Allergies  Allergen Reactions  . Codeine Rash  . Macrobid [Nitrofurantoin Macrocrystal] Rash    Patient Measurements: Height: 5\' 2"  (157.5 cm) Weight: 158 lb (71.668 kg) IBW/kg (Calculated) : 50.1   Vital Signs: Temp: 97.7 F (36.5 C) (05/30 0435) Temp src: Axillary (05/30 0435) BP: 177/67 mmHg (05/30 0435) Pulse Rate: 89 (05/30 0435) Intake/Output from previous day: 05/29 0701 - 05/30 0700 In: 2920 [P.O.:2920] Out: 3000 [Urine:3000] Intake/Output from this shift:    Labs:  Recent Labs  05/11/14 0443 05/12/14 0720 05/12/14 1115 05/13/14 0504  WBC 20.7*  --  12.0* 11.4*  HGB 10.0*  --  12.0 12.5  PLT 198  --  213 240  CREATININE 1.44*  1.38* 0.74  --  0.65   Estimated Creatinine Clearance: 49.4 ml/min (by C-G formula based on Cr of 0.65). No results found for this basename: VANCOTROUGH, VANCOPEAK, VANCORANDOM, [place], [place], [place], [place], TOBRAPEAK, TOBRARND, AMIKACINPEAK, AMIKACINTROU, AMIKACIN,  in the last 72 hours   Microbiology: Recent Results (from the past 720 hour(s))  CULTURE, BLOOD (ROUTINE X 2)     Status: None   Collection Time    05/10/14  7:Melissa PM      Result Value Ref Range Status   Specimen Description BLOOD WRIST RIGHT   Final   Special Requests BOTTLES DRAWN AEROBIC AND ANAEROBIC 5CC   Final   Culture  Setup Time     Final   Value: 05/11/2014 00:43     Performed at [facility]   Culture     Final   Value:        BLOOD CULTURE RECEIVED NO GROWTH TO DATE CULTURE WILL BE HELD FOR 5 DAYS BEFORE ISSUING A FINAL NEGATIVE REPORT     Performed at [facility]   Report Status PENDING   Incomplete  URINE CULTURE     Status: None   Collection Time    05/10/14  8:52 PM      Result Value Ref Range Status   Specimen Description URINE, RANDOM   Final   Special Requests NONE   Final   Culture  Setup Time     Final   Value: 05/10/2014 21:49     Performed at [place]     Final   Value: >=100,000 COLONIES/ML     Performed at [facility]   Culture     Final   Value: KLEBSIELLA PNEUMONIAE     Performed at [facility]   Report Status 05/12/2014 FINAL   Final   Organism ID, Bacteria KLEBSIELLA PNEUMONIAE   Final  CULTURE, BLOOD (ROUTINE X 2)     Status: None   Collection Time    05/10/14  9:10 PM      Result Value Ref Range Status   Specimen Description BLOOD ARM LEFT   Final   Special Requests BOTTLES DRAWN AEROBIC AND ANAEROBIC 5CCBLUE 4CCRED   Final   Culture  Setup Time     Final   Value: 05/11/2014 00:43     Performed at [facility]   Culture     Final   Value:        BLOOD CULTURE RECEIVED NO GROWTH TO DATE CULTURE WILL BE HELD FOR 5 DAYS BEFORE ISSUING A FINAL NEGATIVE REPORT     Performed at [facility]   Report Status PENDING  Incomplete    Medical History: Past Medical History  Diagnosis Date  . Type II or unspecified type diabetes mellitus without mention of complication, not stated as uncontrolled   . Hypertension   . Hyperlipidemia   . Neuropathy   . Hypothyroidism   . Anemia   . Glaucoma   . Hyperglycemia 04/2014    Medications:  Prescriptions prior to admission  Medication Sig Dispense Refill  . ALPRAZolam (XANAX) 0.5 MG tablet Take 0.5 mg by mouth at bedtime as needed for anxiety.      [place] Kitchen amLODipine (NORVASC) 5 MG tablet Take 5 mg by mouth daily.      Mar[place] Kitchen aspirin EC 81 MG tablet Take 81 mg by mouth daily.      . bimatoprost (LUMIGAN) 0.01 % SOLN Place 1 drop into both eyes at bedtime.      Marland Kitchen CALCIUM-MAGNESIUM-ZINC PO Take 1 tablet by mouth daily.      Mar[place] Kitchen Carbonyl Iron (PERFECT IRON PO) Take 1 tablet by mouth daily.      . cycloSPORINE (RESTASIS) 0.05 % ophthalmic emulsion Place 1 drop into both eyes at bedtime.       . dorzolamide-timolol (COSOPT) 22.3-6.8 MG/ML ophthalmic solution Place 1 drop into both eyes 2  (two) times daily.       Mar[place] Kitchen ezetimibe (ZETIA) 10 MG tablet Take 10 mg by mouth daily.      Mar[place] Kitchen gabapentin (NEURONTIN) 300 MG capsule Take 300-900 mg by mouth 3 (three) times daily. Take 1 capsule (300 mg) in the am,lunch and 3 capsules (900 mg) at bedtime      . hydrochlorothiazide (HYDRODIURIL) 25 MG tablet Take 25 mg by mouth every morning.      Mar[place] Kitchen levothyroxine (SYNTHROID, LEVOTHROID) 50 MCG tablet Take 50 mcg by mouth daily before breakfast.      . meclizine (ANTIVERT) 25 MG tablet Take 12.5-25 mg by mouth 3 (three) times daily as needed (vertigo).      . metFORMIN (GLUCOPHAGE-XR) 500 MG 24 hr tablet Take 500-1,000 mg by mouth 2 (two) times daily after a meal.      . Multiple Vitamin (MULITIVITAMIN WITH MINERALS) TABS Take 1 tablet by mouth daily.      Marland Kitchen omega-3 acid ethyl esters (LOVAZA) 1 G capsule Take 1 g by mouth daily.      . traMADol (ULTRAM) 50 MG tablet Take 50 mg by mouth 3 (three) times daily as needed (pain).      . vitamin E 400 UNIT capsule Take 400 Units by mouth daily.      . White Petrolatum-Mineral Oil (GENTEAL PM OP) Place 1 drop into both eyes at bedtime.      . [DISCONTINUED] lisinopril (PRINIVIL,ZESTRIL) 10 MG tablet Take 1 tablet (10 mg total) by mouth daily.  90 tablet  2   Scheduled:  . amLODipine  5 mg Oral Daily  . antiseptic oral rinse  15 mL Mouth Rinse BID  . aspirin EC  81 mg Oral Daily  . cycloSPORINE  1 drop Both Eyes QHS  . dorzolamide-timolol  1 drop Both Eyes BID  . enoxaparin (LOVENOX) injection  40 mg Subcutaneous Q24H  . ezetimibe  10 mg Oral Daily  . gabapentin  300 mg Oral BID WC  . gabapentin  900 mg Oral QHS  . insulin aspart  0-15 Units Subcutaneous TID WC  . insulin aspart  0-5 Units Subcutaneous QHS  . levothyroxine  50 mcg Oral QAC breakfast  . multivitamin with minerals  1  tablet Oral Daily  . omega-3 acid ethyl esters  1 g Oral Daily  . vitamin E  400 Units Oral Daily   Assessment: 80 y.o female with CAP and UTI (klebsiella).   Antibiotics are being changed to levofloxacin today.  Klebsiella is sensitive to levofloxacin.  Renal function has improved, Scr =0.65. I/O = 2920/3400. CrCl ~ 46ml/min. Renal function is at borderline for q24h dosing versus q48 hr dosing.  If Scr rises, will need to decrease dose to q48hr.  WBC 11.4K and afebrile.   Plan:  Levofloxacin 750 mg PO q24h Monitor renal function, temperature, clinical status and adjust dose as needed.  If Scr rises, will need to decrease dose to q48hr.     Nicole Cella, RPh Clinical Pharmacist Pager: [phone] 05/13/2014,7:57 AM

## 2014-05-13 NOTE — Discharge Summary (Addendum)
Physician Discharge Summary  Melissa Mccullough UVO:[id number] DOB: 1928/02/24 DOA: 05/10/2014  PCP: [name], MD  Admit date: 05/10/2014 Discharge date: 05/13/2014  Time spent: 35 minutes  Recommendations for Outpatient Follow-up:  Home health   Discharge Diagnoses:  Principal Problem:   Community acquired pneumonia Active Problems:   HTN (hypertension), benign   Type II or unspecified type diabetes mellitus without mention of complication, not stated as uncontrolled   Acute renal failure   Hyponatremia   Hyperkalemia   UTI (lower urinary tract infection)   Hypoxia    Discharge Condition: improved  Diet recommendation: cardiac/diabetic  Filed Weights   05/10/14 2358 05/12/14 0435 05/13/14 0435  Weight: 75.161 kg (165 lb 11.2 oz) 77.021 kg (169 lb 12.8 oz) 71.668 kg (158 lb)    History of present illness:  80 yo female h/o niddm, htn comes in with son for several days of confusion and her glucose much higher than normal. Lives alone but her son checks in on her frequently. He has noticed she has been confused. She says she has been having dysuria for several days. Denies fevers. No n/v/d. She endorses suprapubic pain for several days. Mild cough. No swelling. No rashes. Her son really got worried when her sugar started climbing up further and further, he says it never gets this high, it was over 300 today. No slurred speech or focal weakness anywhere, although pt does say she has not been as active as normal because she is sleeping more and more tired than usual.   Hospital Course:  Community acquired pneumonia  - change from IV to PO levaquin for d/c - Procalcitonin 1.0, urine strep antigen negative   UTI:  - kelb- sensitive to levaquin  Acute renal failure with hyponatremia, hyperkalemia : Resolved with IV fluid hydration  - Cr has improved with IV fluids, continue gentle hydration, creatinine function improved   HTN (hypertension), benign  -BP borderline-  monitor as outpatient  Type II or unspecified type diabetes mellitus without mention of complication, not stated as uncontrolled  - resume metformin and monitor as outpatient  Procedures:  none  Consultations:  none  Discharge Exam: Filed Vitals:   05/13/14 0435  BP: 177/67  Pulse: 89  Temp: 97.7 F (36.5 C)  Resp: 20    General: A+Ox3,anxious to be discharged Cardiovascular: rrr Respiratory: clear  Discharge Instructions You were cared for by a hospitalist during your hospital stay. If you have any questions about your discharge medications or the care you received while you were in the hospital after you are discharged, you can call the unit and asked to speak with the hospitalist on call if the hospitalist that took care of you is not available. Once you are discharged, your primary care physician will handle any further medical issues. Please note that NO REFILLS for any discharge medications will be authorized once you are discharged, as it is imperative that you return to your primary care physician (or establish a relationship with a primary care physician if you do not have one) for your aftercare needs so that they can reassess your need for medications and monitor your lab values.      Discharge Instructions   Diet - low sodium heart healthy    Complete by:  As directed      Diet Carb Modified    Complete by:  As directed      Discharge instructions    Complete by:  As directed   Please keep yourself  hydrated. Please note that hydrochlorothiazide has been STOPPED. Cbc, bmp 1 week Home health with 24 hour supervision by family     Increase activity slowly    Complete by:  As directed             Medication List    STOP taking these medications       hydrochlorothiazide 25 MG tablet  Commonly known as:  HYDRODIURIL      TAKE these medications       ALPRAZolam 0.5 MG tablet  Commonly known as:  XANAX  Take 0.5 mg by mouth at bedtime as needed for  anxiety.     amLODipine 5 MG tablet  Commonly known as:  NORVASC  Take 5 mg by mouth daily.     aspirin EC 81 MG tablet  Take 81 mg by mouth daily.     bimatoprost 0.01 % Soln  Commonly known as:  LUMIGAN  Place 1 drop into both eyes at bedtime.     CALCIUM-MAGNESIUM-ZINC PO  Take 1 tablet by mouth daily.     cycloSPORINE 0.05 % ophthalmic emulsion  Commonly known as:  RESTASIS  Place 1 drop into both eyes at bedtime.     dorzolamide-timolol 22.3-6.8 MG/ML ophthalmic solution  Commonly known as:  COSOPT  Place 1 drop into both eyes 2 (two) times daily.     ezetimibe 10 MG tablet  Commonly known as:  ZETIA  Take 10 mg by mouth daily.     gabapentin 300 MG capsule  Commonly known as:  NEURONTIN  Take 300-900 mg by mouth 3 (three) times daily. Take 1 capsule (300 mg) in the am,lunch and 3 capsules (900 mg) at bedtime     GENTEAL PM OP  Place 1 drop into both eyes at bedtime.     levofloxacin 750 MG tablet  Commonly known as:  LEVAQUIN  Take 1 tablet (750 mg total) by mouth daily.     levothyroxine 50 MCG tablet  Commonly known as:  SYNTHROID, LEVOTHROID  Take 50 mcg by mouth daily before breakfast.     lisinopril 10 MG tablet  Commonly known as:  PRINIVIL,ZESTRIL  Take 1 tablet (10 mg total) by mouth daily.     meclizine 25 MG tablet  Commonly known as:  ANTIVERT  Take 12.5-25 mg by mouth 3 (three) times daily as needed (vertigo).     metFORMIN 500 MG 24 hr tablet  Commonly known as:  GLUCOPHAGE-XR  Take 500-1,000 mg by mouth 2 (two) times daily after a meal.     multivitamin with minerals Tabs tablet  Take 1 tablet by mouth daily.     omega-3 acid ethyl esters 1 G capsule  Commonly known as:  LOVAZA  Take 1 g by mouth daily.     PERFECT IRON PO  Take 1 tablet by mouth daily.     traMADol 50 MG tablet  Commonly known as:  ULTRAM  Take 50 mg by mouth 3 (three) times daily as needed (pain).     vitamin E 400 UNIT capsule  Take 400 Units by mouth daily.        Allergies  Allergen Reactions  . Codeine Rash  . Macrobid [Nitrofurantoin Macrocrystal] Rash   Follow-up Information   Follow up with [name], MD. Schedule an appointment as soon as possible for a visit in 10 days. (for hospital follow-up)    Specialty:  Internal Medicine   Contact information:   [place]  [place] [address] [phone]       Follow up with [facility]. (Equities trader, Physical adn Occupational Therapy and Aide. )    Contact information:   [address] [place] [address] [phone]        The results of significant diagnostics from this hospitalization (including imaging, microbiology, ancillary and laboratory) are listed below for reference.    Significant Diagnostic Studies: Dg Chest Port 1 View  05/10/2014   CLINICAL DATA:  Hypoxia; leukocytosis.  History of smoking.  EXAM: PORTABLE CHEST - 1 VIEW  COMPARISON:  Chest radiograph performed 12/31/2012  FINDINGS: The lungs are well-aerated. Mild right basilar opacity may reflect atelectasis. The lungs are otherwise clear. No pleural effusion or pneumothorax is seen.  The cardiomediastinal silhouette is mildly enlarged. Scattered clips are seen overlying the left thyroid bed. No acute osseous abnormalities are seen. A calcification overlying the left humeral head likely reflects left-sided calcific tendinitis. A clip is seen at the left axilla.  IMPRESSION: 1. Mild right basilar airspace opacity may reflect atelectasis; lungs otherwise clear. 2. Mild cardiomegaly. 3. Calcification overlying the left humeral head likely reflects left-sided calcific tendinitis.   Electronically Signed   By: Garald Balding M.D.   On: 05/10/2014 21:47    Microbiology: Recent Results (from the past 240 hour(s))  CULTURE, BLOOD (ROUTINE X 2)     Status: None   Collection Time    05/10/14  7:58 PM      Result Value Ref Range Status   Specimen Description  BLOOD WRIST RIGHT   Final   Special Requests BOTTLES DRAWN AEROBIC AND ANAEROBIC 5CC   Final   Culture  Setup Time     Final   Value: 05/11/2014 00:43     Performed at [facility]   Culture     Final   Value:        BLOOD CULTURE RECEIVED NO GROWTH TO DATE CULTURE WILL BE HELD FOR 5 DAYS BEFORE ISSUING A FINAL NEGATIVE REPORT     Performed at [facility]   Report Status PENDING   Incomplete  URINE CULTURE     Status: None   Collection Time    05/10/14  8:52 PM      Result Value Ref Range Status   Specimen Description URINE, RANDOM   Final   Special Requests NONE   Final   Culture  Setup Time     Final   Value: 05/10/2014 21:49     Performed at [place]     Final   Value: >=100,000 COLONIES/ML     Performed at [facility]   Culture     Final   Value: KLEBSIELLA PNEUMONIAE     Performed at [facility]   Report Status 05/12/2014 FINAL   Final   Organism ID, Bacteria KLEBSIELLA PNEUMONIAE   Final  CULTURE, BLOOD (ROUTINE X 2)     Status: None   Collection Time    05/10/14  9:10 PM      Result Value Ref Range Status   Specimen Description BLOOD ARM LEFT   Final   Special Requests BOTTLES DRAWN AEROBIC AND ANAEROBIC 5CCBLUE 4CCRED   Final   Culture  Setup Time     Final   Value: 05/11/2014 00:43     Performed at [facility]   Culture     Final   Value:  BLOOD CULTURE RECEIVED NO GROWTH TO DATE CULTURE WILL BE HELD FOR 5 DAYS BEFORE ISSUING A FINAL NEGATIVE REPORT     Performed at [facility]   Report Status PENDING   Incomplete     Labs: Basic Metabolic Panel:  Recent Labs Lab 05/10/14 1755 05/10/14 2003 05/11/14 0443 05/12/14 0720 05/13/14 0504  NA 132*  --  134* 140 139  K 6.3* 5.1 4.6 4.5 4.0  CL 94*  --  98 104 106  CO2 25  --  26 25 21   GLUCOSE 277*  --  135* 162* 162*  BUN 35*  --  39* 22 15  CREATININE 1.41*  --  1.44*  1.38* 0.74 0.65  CALCIUM 10.3  --  9.2 9.5 9.6    Liver Function Tests:  Recent Labs Lab 05/10/14 1755  AST 36  ALT 24  ALKPHOS 76  BILITOT 0.6  PROT 7.3  ALBUMIN 3.4*   No results found for this basename: LIPASE, AMYLASE,  in the last 168 hours No results found for this basename: AMMONIA,  in the last 168 hours CBC:  Recent Labs Lab 05/10/14 1755 05/11/14 0443 05/12/14 1115 05/13/14 0504  WBC 28.3* 20.7* 12.0* 11.4*  NEUTROABS  --  15.8*  --   --   HGB 12.4 10.0* 12.0 12.5  HCT 36.3 29.8* 35.4* 36.5  MCV 94.3 94.9 93.7 91.9  PLT 241 198 213 240   Cardiac Enzymes: No results found for this basename: CKTOTAL, CKMB, CKMBINDEX, TROPONINI,  in the last 168 hours BNP: BNP (last 3 results)  Recent Labs  05/10/14 2003  PROBNP 683.2*   CBG:  Recent Labs Lab 05/12/14 0730 05/12/14 1151 05/12/14 1634 05/12/14 2010 05/13/14 0738  GLUCAP 136* 182* 182* 151* 185*       Signed:  Geradine Girt  [facility] 05/13/2014, 8:50 AM

## 2014-05-15 DIAGNOSIS — E785 Hyperlipidemia, unspecified: Secondary | ICD-10-CM

## 2014-05-15 DIAGNOSIS — G609 Hereditary and idiopathic neuropathy, unspecified: Secondary | ICD-10-CM

## 2014-05-15 DIAGNOSIS — J189 Pneumonia, unspecified organism: Secondary | ICD-10-CM

## 2014-05-15 DIAGNOSIS — E119 Type 2 diabetes mellitus without complications: Secondary | ICD-10-CM

## 2014-05-17 ENCOUNTER — Ambulatory Visit (INDEPENDENT_AMBULATORY_CARE_PROVIDER_SITE_OTHER): Payer: Medicare Other | Admitting: Internal Medicine

## 2014-05-17 ENCOUNTER — Encounter: Payer: Self-pay | Admitting: Internal Medicine

## 2014-05-17 VITALS — BP 142/78 | HR 76 | Temp 97.9°F | Resp 16 | Ht 61.5 in | Wt 162.2 lb

## 2014-05-17 DIAGNOSIS — Z79899 Other long term (current) drug therapy: Secondary | ICD-10-CM

## 2014-05-17 DIAGNOSIS — E559 Vitamin D deficiency, unspecified: Secondary | ICD-10-CM | POA: Insufficient documentation

## 2014-05-17 DIAGNOSIS — E1129 Type 2 diabetes mellitus with other diabetic kidney complication: Secondary | ICD-10-CM

## 2014-05-17 DIAGNOSIS — I10 Essential (primary) hypertension: Secondary | ICD-10-CM

## 2014-05-17 DIAGNOSIS — N390 Urinary tract infection, site not specified: Secondary | ICD-10-CM

## 2014-05-17 DIAGNOSIS — E1149 Type 2 diabetes mellitus with other diabetic neurological complication: Secondary | ICD-10-CM

## 2014-05-17 LAB — BASIC METABOLIC PANEL WITH GFR
BUN: 15 mg/dL (ref 6–23)
CALCIUM: 9.7 mg/dL (ref 8.4–10.5)
CO2: 28 mEq/L (ref 19–32)
Chloride: 95 mEq/L — ABNORMAL LOW (ref 96–112)
Creat: 0.76 mg/dL (ref 0.50–1.10)
GFR, Est African American: 84 mL/min
GFR, Est Non African American: 73 mL/min
GLUCOSE: 129 mg/dL — AB (ref 70–99)
POTASSIUM: 5 meq/L (ref 3.5–5.3)
Sodium: 129 mEq/L — ABNORMAL LOW (ref 135–145)

## 2014-05-17 LAB — CBC WITH DIFFERENTIAL/PLATELET
BASOS ABS: 0.1 10*3/uL (ref 0.0–0.1)
Basophils Relative: 1 % (ref 0–1)
EOS ABS: 0.4 10*3/uL (ref 0.0–0.7)
EOS PCT: 4 % (ref 0–5)
HCT: 38.3 % (ref 36.0–46.0)
HEMOGLOBIN: 13.2 g/dL (ref 12.0–15.0)
Lymphocytes Relative: 27 % (ref 12–46)
Lymphs Abs: 2.8 10*3/uL (ref 0.7–4.0)
MCH: 31.1 pg (ref 26.0–34.0)
MCHC: 34.5 g/dL (ref 30.0–36.0)
MCV: 90.3 fL (ref 78.0–100.0)
MONO ABS: 0.9 10*3/uL (ref 0.1–1.0)
MONOS PCT: 9 % (ref 3–12)
NEUTROS ABS: 6 10*3/uL (ref 1.7–7.7)
Neutrophils Relative %: 59 % (ref 43–77)
Platelets: 274 10*3/uL (ref 150–400)
RBC: 4.24 MIL/uL (ref 3.87–5.11)
RDW: 13.2 % (ref 11.5–15.5)
WBC: 10.2 10*3/uL (ref 4.0–10.5)

## 2014-05-17 LAB — CULTURE, BLOOD (ROUTINE X 2)
CULTURE: NO GROWTH
CULTURE: NO GROWTH

## 2014-05-17 MED ORDER — GABAPENTIN 600 MG PO TABS: 600.0000 mg | ORAL_TABLET | Freq: Every day | ORAL | Status: DC

## 2014-05-17 MED ORDER — GABAPENTIN 600 MG PO TABS: ORAL_TABLET | ORAL | Status: DC

## 2014-05-17 NOTE — Progress Notes (Signed)
Subjective:    Patient ID: Melissa Mccullough, female    DOB: [birth year], 80 y.o.   MRN: [id number]  HPI Very nice 80 yo WWF with HTN, T2_NIDDM w/ CKD recently hospitalized with Poorly controlled DM, Dehydration suspect possible CAP with final Dx'd w/ Klebsiella UTI treated with Levaquin(finished yesterday). Patient reports CBG's are running in the 150's +/- range now. Also, patient is c/o ongoing burning pains of feet not controlled with current dose of Gabapentin.    Medication List   ALPRAZolam 0.5 MG tablet  Commonly known as:  XANAX  Take 0.5 mg by mouth at bedtime as needed for anxiety.     amLODipine 5 MG tablet  Commonly known as:  NORVASC  Take 5 mg by mouth daily.     aspirin EC 81 MG tablet  Take 81 mg by mouth daily.     bimatoprost 0.01 % Soln  Commonly known as:  LUMIGAN  Place 1 drop into both eyes at bedtime.     CALCIUM-MAGNESIUM-ZINC PO  Take 1 tablet by mouth daily.     cycloSPORINE 0.05 % ophthalmic emulsion  Commonly known as:  RESTASIS  Place 1 drop into both eyes at bedtime.     dorzolamide-timolol 22.3-6.8 MG/ML ophthalmic solution  Commonly known as:  COSOPT  Place 1 drop into both eyes 2 (two) times daily.     ezetimibe 10 MG tablet  Commonly known as:  ZETIA  Take 10 mg by mouth daily.     gabapentin 600 MG tablet  Commonly known as:  NEURONTIN  Take 1 tablet 4 x day as directed for neuritis pain     GENTEAL PM OP  Place 1 drop into both eyes at bedtime.     hydrochlorothiazide 25 MG tablet  Commonly known as:  HYDRODIURIL     levothyroxine 50 MCG tablet  Commonly known as:  SYNTHROID, LEVOTHROID  Take 50 mcg by mouth daily before breakfast.     lisinopril 10 MG tablet  Commonly known as:  PRINIVIL,ZESTRIL  Take 1 tablet (10 mg total) by mouth daily.     meclizine 25 MG tablet  Commonly known as:  ANTIVERT  Take 12.5-25 mg by mouth 3 (three) times daily as needed (vertigo).     metFORMIN 500 MG 24 hr tablet  Commonly known as:   GLUCOPHAGE-XR  Take 500-1,000 mg by mouth 2 (two) times daily after a meal.     multivitamin with minerals Tabs tablet  Take 1 tablet by mouth daily.     omega-3 acid ethyl esters 1 G capsule  Commonly known as:  LOVAZA  Take 1 g by mouth daily.     PERFECT IRON PO  Take 1 tablet by mouth daily.     traMADol 50 MG tablet  Commonly known as:  ULTRAM  Take 50 mg by mouth 3 (three) times daily as needed (pain).     vitamin E 400 UNIT capsule  Take 400 Units by mouth daily.       Allergies  Allergen Reactions  . Codeine Rash  . Macrobid [Nitrofurantoin Macrocrystal] Rash   Past Medical History  Diagnosis Date  . Type II or unspecified type diabetes mellitus without mention of complication, not stated as uncontrolled   . Hypertension   . Hyperlipidemia   . Neuropathy   . Hypothyroidism   . Anemia   . Glaucoma   . Hyperglycemia 04/2014   Past Surgical History  Procedure Laterality Date  . Breast surgery    .  Cholecystectomy    . Thyroidectomy     Review of Systems In addition to the HPI above,  No Fever-chills since home, No Headache, No changes with Vision or hearing,  No problems swallowing food or Liquids,  No Chest pain or productive Cough or Shortness of Breath,  No Abdominal pain, No Nausea or Vommitting, Bowel movements are regular,  No Blood in stool or Urine,  No dysuria,  No new skin rashes or bruises,  No new joints pains-aches,  No new weakness, tingling, numbness in any extremity,  No recent weight loss,  No polyuria, polydypsia or polyphagia,  No significant Mental Stressors.  A full 10 point Review of Systems was done, except as stated above, all other Review of Systems were negative   Objective:   Physical Exam  BP 142/78  P 76  T 97.9 F  Resp 16  Ht 5' 1.5" Wt 162 lb 3.2 oz   BMI 30.15 kg/m2  HEENT - Eac's patent. TM's Nl.EOM's full. PERRLA. NasoOroPharynx clear. Neck - supple. Nl Thyroid. No bruits nodes JVD Chest - Clear equal  BS Cor - Nl HS. RRR w/o sig MGR. PP 1(+) No edema. Abd - Soft Obese benign. BS nl. MS- FROM. w/o deformities. Muscle power tone and bulk Nl. Gait Nl.Uses cane for stability Neuro - No obvious Cr N abnormalities. Sensory, motor and Cerebellar functions appear Nl w/o focal abnormalities other than decreased sensory in a stocking distribution.  Assessment & Plan:   1. Hypertension  2. T2_NIDDM w/ Stage 3 CKD (GFR 68 ml/min)  3. UTI (lower urinary tract infection) - u/a & c/s - encouraged increase water intake  4. Diabetic Neuropathy - Increase Gabapentin to 600 mg tid/qid  Recc stop Lovaza and only use krill oil.  Has 3 wk f/u OV

## 2014-05-17 NOTE — Patient Instructions (Addendum)
Urinary Tract Infection  Urinary tract infections (UTIs) can develop anywhere along your urinary tract. Your urinary tract is your body's drainage system for removing wastes and extra water. Your urinary tract includes two kidneys, two ureters, a bladder, and a urethra. Your kidneys are a pair of bean-shaped organs. Each kidney is about the size of your fist. They are located below your ribs, one on each side of your spine.  CAUSES  Infections are caused by microbes, which are microscopic organisms, including fungi, viruses, and bacteria. These organisms are so small that they can only be seen through a microscope. Bacteria are the microbes that most commonly cause UTIs.  SYMPTOMS   Symptoms of UTIs may vary by age and gender of the patient and by the location of the infection. Symptoms in young women typically include a frequent and intense urge to urinate and a painful, burning feeling in the bladder or urethra during urination. Older women and men are more likely to be tired, shaky, and weak and have muscle aches and abdominal pain. A fever may mean the infection is in your kidneys. Other symptoms of a kidney infection include pain in your back or sides below the ribs, nausea, and vomiting.  DIAGNOSIS  To diagnose a UTI, your caregiver will ask you about your symptoms. Your caregiver also will ask to provide a urine sample. The urine sample will be tested for bacteria and white blood cells. White blood cells are made by your body to help fight infection.  TREATMENT   Typically, UTIs can be treated with medication. Because most UTIs are caused by a bacterial infection, they usually can be treated with the use of antibiotics. The choice of antibiotic and length of treatment depend on your symptoms and the type of bacteria causing your infection.  HOME CARE INSTRUCTIONS   If you were prescribed antibiotics, take them exactly as your caregiver instructs you. Finish the medication even if you feel better after you  have only taken some of the medication.   Drink enough water and fluids to keep your urine clear or pale yellow.   Avoid caffeine, tea, and carbonated beverages. They tend to irritate your bladder.   Empty your bladder often. Avoid holding urine for long periods of time.   Empty your bladder before and after sexual intercourse.   After a bowel movement, women should cleanse from front to back. Use each tissue only once.  SEEK MEDICAL CARE IF:    You have back pain.   You develop a fever.   Your symptoms do not begin to resolve within 3 days.  SEEK IMMEDIATE MEDICAL CARE IF:    You have severe back pain or lower abdominal pain.   You develop chills.   You have nausea or vomiting.   You have continued burning or discomfort with urination.  MAKE SURE YOU:    Understand these instructions.   Will watch your condition.   Will get help right away if you are not doing well or get worse.  Document Released: 09/10/2005 Document Revised: 06/01/2012 Document Reviewed: 01/09/2012  [employer] Patient Information 2014 [employer]

## 2014-05-18 LAB — URINALYSIS, MICROSCOPIC ONLY
BACTERIA UA: NONE SEEN
CASTS: NONE SEEN
Crystals: NONE SEEN

## 2014-05-22 ENCOUNTER — Other Ambulatory Visit: Payer: Self-pay | Admitting: Internal Medicine

## 2014-05-22 DIAGNOSIS — N3000 Acute cystitis without hematuria: Secondary | ICD-10-CM

## 2014-05-22 LAB — URINE CULTURE: Colony Count: 30000

## 2014-05-25 ENCOUNTER — Ambulatory Visit (INDEPENDENT_AMBULATORY_CARE_PROVIDER_SITE_OTHER): Payer: Medicare Other | Admitting: Podiatry

## 2014-05-25 DIAGNOSIS — B351 Tinea unguium: Secondary | ICD-10-CM

## 2014-05-25 DIAGNOSIS — M79609 Pain in unspecified limb: Secondary | ICD-10-CM

## 2014-05-25 NOTE — Progress Notes (Signed)
   Subjective:    Patient ID: Melissa Mccullough, female    DOB: Mar 04, 1928, 80 y.o.   MRN: [id number]  HPI  TOENAILS TRIM.    Review of Systems     Objective:   Physical Exam        Assessment & Plan:

## 2014-05-29 NOTE — Progress Notes (Signed)
Subjective:     Patient ID: Melissa Mccullough, female   DOB: 06/08/1928, 80 y.o.   MRN: [id number]  HPI patient resents with thick yellow toenails 1-5 both feet that she cannot cut   Review of Systems     Objective:   Physical Exam Neurovascular status unchanged with patient well oriented and found to have thick yellow brittle nailbeds 1-5 both feet    Assessment:     Mycotic nail infection with pain 1-5 both feet    Plan:     Debris painful nailbeds 1-5 both feet with no bleeding noted

## 2014-06-02 ENCOUNTER — Ambulatory Visit: Payer: Self-pay

## 2014-06-09 ENCOUNTER — Encounter: Payer: Self-pay | Admitting: Internal Medicine

## 2014-06-09 ENCOUNTER — Ambulatory Visit (INDEPENDENT_AMBULATORY_CARE_PROVIDER_SITE_OTHER): Payer: Medicare Other | Admitting: Internal Medicine

## 2014-06-09 VITALS — BP 132/70 | HR 80 | Temp 98.1°F | Resp 16 | Ht 61.5 in | Wt 160.8 lb

## 2014-06-09 DIAGNOSIS — R609 Edema, unspecified: Secondary | ICD-10-CM

## 2014-06-09 DIAGNOSIS — N390 Urinary tract infection, site not specified: Secondary | ICD-10-CM

## 2014-06-09 DIAGNOSIS — I10 Essential (primary) hypertension: Secondary | ICD-10-CM

## 2014-06-09 DIAGNOSIS — Z79899 Other long term (current) drug therapy: Secondary | ICD-10-CM

## 2014-06-09 DIAGNOSIS — F4322 Adjustment disorder with anxiety: Secondary | ICD-10-CM

## 2014-06-09 LAB — BASIC METABOLIC PANEL WITH GFR
BUN: 14 mg/dL (ref 6–23)
CO2: 29 meq/L (ref 19–32)
CREATININE: 0.67 mg/dL (ref 0.50–1.10)
Calcium: 10.2 mg/dL (ref 8.4–10.5)
Chloride: 100 mEq/L (ref 96–112)
GFR, Est Non African American: 82 mL/min
GLUCOSE: 147 mg/dL — AB (ref 70–99)
Potassium: 4.8 mEq/L (ref 3.5–5.3)
Sodium: 138 mEq/L (ref 135–145)

## 2014-06-09 MED ORDER — ALPRAZOLAM 0.5 MG PO TABS: ORAL_TABLET | ORAL | Status: DC

## 2014-06-09 NOTE — Patient Instructions (Addendum)
  Restart HCTZ (Hydrochlorothiaide)  &   when swelling better - try cut back on the doseto 1/2 tab or 3 x week -------------------------------------------------------------------------------------------------- Edema Edema is an abnormal buildup of fluids. It is more common in your legs and thighs. Painless swelling of the feet and ankles is more likely as a person ages. It also is common in looser skin, like around your eyes. HOME CARE   Keep the affected body part above the level of the heart while lying down.  Do not sit still or stand for a long time.  Do not put anything right under your knees when you lie down.  Do not wear tight clothes on your upper legs.  Exercise your legs to help the puffiness (swelling) go down.  Wear elastic bandages or support stockings as told by your doctor.  A low-salt diet may help lessen the puffiness.  Only take medicine as told by your doctor. GET HELP IF:  Treatment is not working.  You have heart, liver, or kidney disease and notice that your skin looks puffy or shiny.  You have puffiness in your legs that does not get better when you raise your legs.  You have sudden weight gain for no reason. GET HELP RIGHT AWAY IF:   You have shortness of breath or chest pain.  You cannot breathe when you lie down.  You have pain, redness, or warmth in the areas that are puffy.  You have heart, liver, or kidney disease and get edema all of a sudden.  You have a fever and your symptoms get worse all of a sudden. MAKE SURE YOU:   Understand these instructions.  Will watch your condition.  Will get help right away if you are not doing well or get worse. Document Released: 05/19/2008 Document Revised: 12/06/2013 Document Reviewed: 09/23/2013 [facility] Patient Information 2015 [place], [place]. This information is not intended to replace advice given to you by your health care provider. Make sure you discuss any questions you have with your  health care provider.   Urinary Tract Infection A urinary tract infection (UTI) can occur any place along the urinary tract. The tract includes the kidneys, ureters, bladder, and urethra. A type of germ called bacteria often causes a UTI. UTIs are often helped with antibiotic medicine.  HOME CARE   If given, take antibiotics as told by your doctor. Finish them even if you start to feel better.  Drink enough fluids to keep your pee (urine) clear or pale yellow.  Avoid tea, drinks with caffeine, and bubbly (carbonated) drinks.  Pee often. Avoid holding your pee in for a long time.  Pee before and after having sex (intercourse).  Wipe from front to back after you poop (bowel movement) if you are a woman. Use each tissue only once. GET HELP RIGHT AWAY IF:   You have back pain.  You have lower belly (abdominal) pain.  You have chills.  You feel sick to your stomach (nauseous).  You throw up (vomit).  Your burning or discomfort with peeing does not go away.  You have a fever.  Your symptoms are not better in 3 days. MAKE SURE YOU:   Understand these instructions.  Will watch your condition.  Will get help right away if you are not doing well or get worse.

## 2014-06-09 NOTE — Progress Notes (Signed)
Patient ID: Melissa Mccullough, female    DOB: 1928/04/20, 80 y.o.   MRN: [id number]  HPI 80 yo WWF in for 3 week f/u after recent hospitalization for Dehydration / UTI (Klebsiella) & had post hospital  U/c growing a  Highly resistant enterococcus (VRE) and as she was Asx and was suspect for colonization  no Abx's were tendered and only recc to force fluids and acidify urine with Vit C 1000 mg qid. Patient has been off of her HCTZ and has developed some assym dependent edema (L>R) over the last week.   Medication List       This list is accurate as of: 06/09/14  4:30 PM.  Always use your most recent med list.               ALPRAZolam 0.5 MG tablet  Commonly known as:  XANAX  Take 1/2 to 1 tablet 3 x day as needed for anxiety or sleep     amLODipine 5 MG tablet  Commonly known as:  NORVASC  Take 5 mg by mouth daily.     aspirin EC 81 MG tablet  Take 81 mg by mouth daily.     bimatoprost 0.01 % Soln  Commonly known as:  LUMIGAN  Place 1 drop into both eyes at bedtime.     CALCIUM-MAGNESIUM-ZINC PO  Take 1 tablet by mouth daily.     cycloSPORINE 0.05 % ophthalmic emulsion  Commonly known as:  RESTASIS  Place 1 drop into both eyes at bedtime.     dorzolamide-timolol 22.3-6.8 MG/ML ophthalmic solution  Commonly known as:  COSOPT  Place 1 drop into both eyes 2 (two) times daily.     ezetimibe 10 MG tablet  Commonly known as:  ZETIA  Take 10 mg by mouth daily.     gabapentin 600 MG tablet  Commonly known as:  NEURONTIN  Take 1 tablet 4 x day as directed for neuritis pain     GENTEAL PM OP  Place 1 drop into both eyes at bedtime.     hydrochlorothiazide 25 MG tablet  Commonly known as:  HYDRODIURIL     levothyroxine 50 MCG tablet  Commonly known as:  SYNTHROID, LEVOTHROID  Take 50 mcg by mouth daily before breakfast.     lisinopril 10 MG tablet  Commonly known as:  PRINIVIL,ZESTRIL  Take 1 tablet (10 mg total) by mouth daily.     meclizine 25 MG tablet  Commonly  known as:  ANTIVERT  Take 12.5-25 mg by mouth 3 (three) times daily as needed (vertigo).     metFORMIN 500 MG 24 hr tablet  Commonly known as:  GLUCOPHAGE-XR  Take 500 mg by mouth 3 (three) times daily after meals. Takes 2 tabs at lunch if glucose above 200     multivitamin with minerals Tabs tablet  Take 1 tablet by mouth daily.     omega-3 acid ethyl esters 1 G capsule  Commonly known as:  LOVAZA  Take 1 g by mouth 2 (two) times daily.     PERFECT IRON PO  Take 1 tablet by mouth daily.     traMADol 50 MG tablet  Commonly known as:  ULTRAM  Take 50 mg by mouth 3 (three) times daily as needed (pain).     vitamin C 1000 MG tablet  Take 1,000 mg by mouth 3 (three) times daily. For urine infection     VITAMIN D PO  Take 1,000 Units by mouth daily.  vitamin E 400 UNIT capsule  Take 400 Units by mouth daily.       Allergies  Allergen Reactions  . Codeine Rash  . Macrobid [Nitrofurantoin Macrocrystal] Rash   Past Medical History  Diagnosis Date  . Type II or unspecified type diabetes mellitus without mention of complication, not stated as uncontrolled   . Hypertension   . Hyperlipidemia   . Neuropathy   . Hypothyroidism   . Anemia   . Glaucoma   . Hyperglycemia 04/2014   Past Surgical History  Procedure Laterality Date  . Breast surgery    . Cholecystectomy    . Thyroidectomy     Review of Systems In addition to the HPI above,  No Fever-chills,  No Headache, No changes with Vision or hearing,  No problems swallowing food or Liquids,  No Chest pain or productive Cough or Shortness of Breath,  No Abdominal pain, No Nausea or Vomitting, Bowel movements are regular,  No Blood in stool or Urine,  No dysuria,  No new skin rashes or bruises,  No new joints pains-aches,  No new weakness, tingling, numbness in any extremity,  No recent weight loss,  No polyuria, polydypsia or polyphagia,  No significant Mental Stressors.  A full 10 point Review of Systems was  done, except as stated above, all other Review of Systems were negative  Objective:   Physical Exam BP 132/70  P 80  T98.1 F   Resp 16  Ht 5' 1.5"  Wt 160 lb 12.8 oz   BMI 29.89 kg/m2  HEENT - Eac's patent. TM's Nl. EOM's full. PERRLA. NasoOroPharynx clear. Neck - supple. Nl Thyroid. Carotids 2+ & No bruits, nodes, JVD Chest - Clear equal BS w/o Rales, rhonchi, wheezes. Cor - Nl HS. RRR w/o sig MGR. PP 1(+). Sl  Edema L>R / Neg Homan's /calf tenderness. Abd - No palpable organomegaly, masses or tenderness. BS nl. MS- FROM w/o deformities. Muscle power, tone and bulk decreased. Neuro - No obvious Cr N abnormalities. Sensory, motor and Cerebellar functions appear Nl w/o focal abnormalities. Psyche - Mental status normal & appropriate.  No delusions, ideations or obvious mood abnormalities.  Assessment & Plan:   1. Hypertension  2. UTI (lower urinary tract infection) - Urine Microscopic - Urine culture  3. Edema - restart HCTZ til edema resolves the qod or prn  4. Encounter for long-term (current) use of other medications - BASIC METABOLIC PANEL WITH GFR  5. Adjustment disorder with anxious mood - ALPRAZolam (XANAX) 0.5 MG tablet; Take 1/2 to 1 tablet 3 x day as needed for anxiety or sleep  Dispense: 90 tablet; Refill: 5

## 2014-06-10 LAB — URINALYSIS, MICROSCOPIC ONLY
BACTERIA UA: NONE SEEN
CRYSTALS: NONE SEEN
Casts: NONE SEEN
Squamous Epithelial / LPF: NONE SEEN

## 2014-06-14 ENCOUNTER — Other Ambulatory Visit: Payer: Self-pay | Admitting: Internal Medicine

## 2014-06-14 DIAGNOSIS — N3000 Acute cystitis without hematuria: Secondary | ICD-10-CM

## 2014-06-14 LAB — URINE CULTURE: Colony Count: >=100000

## 2014-06-14 MED ORDER — AMOXICILLIN 250 MG PO CAPS: ORAL_CAPSULE | ORAL | Status: DC

## 2014-07-13 ENCOUNTER — Ambulatory Visit (INDEPENDENT_AMBULATORY_CARE_PROVIDER_SITE_OTHER): Payer: Medicare Other | Admitting: *Deleted

## 2014-07-13 DIAGNOSIS — N3000 Acute cystitis without hematuria: Secondary | ICD-10-CM

## 2014-07-13 NOTE — Progress Notes (Signed)
Patient ID: Melissa Mccullough, female   DOB: [birth year], 80 y.o.   MRN: [id number] Patient presents today for 1 month recheck UA, C&S.  Patient denies any current symptoms.

## 2014-07-14 ENCOUNTER — Ambulatory Visit: Payer: Self-pay

## 2014-07-14 LAB — URINALYSIS, MICROSCOPIC ONLY
Bacteria, UA: NONE SEEN
Casts: NONE SEEN
Crystals: NONE SEEN
SQUAMOUS EPITHELIAL / LPF: NONE SEEN

## 2014-07-15 LAB — URINE CULTURE

## 2014-07-16 ENCOUNTER — Other Ambulatory Visit: Payer: Self-pay | Admitting: Internal Medicine

## 2014-07-16 MED ORDER — SULFAMETHOXAZOLE-TMP DS 800-160 MG PO TABS: 1.0000 | ORAL_TABLET | Freq: Two times a day (BID) | ORAL | Status: AC

## 2014-07-17 ENCOUNTER — Emergency Department (HOSPITAL_COMMUNITY): Payer: Medicare Other

## 2014-07-17 ENCOUNTER — Other Ambulatory Visit: Payer: Self-pay | Admitting: *Deleted

## 2014-07-17 ENCOUNTER — Encounter (HOSPITAL_COMMUNITY): Payer: Self-pay | Admitting: Emergency Medicine

## 2014-07-17 ENCOUNTER — Inpatient Hospital Stay (HOSPITAL_COMMUNITY)
Admission: EM | Admit: 2014-07-17 | Discharge: 2014-07-21 | DRG: 689 | Disposition: A | Discharge status: Nursing Home (Includes ICF) | Payer: Medicare Other | Attending: Internal Medicine | Admitting: Internal Medicine

## 2014-07-17 DIAGNOSIS — R0602 Shortness of breath: Secondary | ICD-10-CM | POA: Diagnosis present

## 2014-07-17 DIAGNOSIS — E871 Hypo-osmolality and hyponatremia: Secondary | ICD-10-CM | POA: Diagnosis present

## 2014-07-17 DIAGNOSIS — Z7982 Long term (current) use of aspirin: Secondary | ICD-10-CM

## 2014-07-17 DIAGNOSIS — N289 Disorder of kidney and ureter, unspecified: Secondary | ICD-10-CM | POA: Diagnosis present

## 2014-07-17 DIAGNOSIS — R0902 Hypoxemia: Secondary | ICD-10-CM

## 2014-07-17 DIAGNOSIS — H409 Unspecified glaucoma: Secondary | ICD-10-CM

## 2014-07-17 DIAGNOSIS — G589 Mononeuropathy, unspecified: Secondary | ICD-10-CM | POA: Diagnosis present

## 2014-07-17 DIAGNOSIS — Z87440 Personal history of urinary (tract) infections: Secondary | ICD-10-CM

## 2014-07-17 DIAGNOSIS — Z79899 Other long term (current) drug therapy: Secondary | ICD-10-CM | POA: Diagnosis not present

## 2014-07-17 DIAGNOSIS — E039 Hypothyroidism, unspecified: Secondary | ICD-10-CM | POA: Diagnosis present

## 2014-07-17 DIAGNOSIS — E119 Type 2 diabetes mellitus without complications: Secondary | ICD-10-CM | POA: Diagnosis present

## 2014-07-17 DIAGNOSIS — I10 Essential (primary) hypertension: Secondary | ICD-10-CM

## 2014-07-17 DIAGNOSIS — G9340 Encephalopathy, unspecified: Secondary | ICD-10-CM | POA: Diagnosis present

## 2014-07-17 DIAGNOSIS — A498 Other bacterial infections of unspecified site: Secondary | ICD-10-CM | POA: Diagnosis present

## 2014-07-17 DIAGNOSIS — N390 Urinary tract infection, site not specified: Principal | ICD-10-CM

## 2014-07-17 DIAGNOSIS — G9341 Metabolic encephalopathy: Secondary | ICD-10-CM | POA: Diagnosis present

## 2014-07-17 DIAGNOSIS — Z87891 Personal history of nicotine dependence: Secondary | ICD-10-CM

## 2014-07-17 DIAGNOSIS — E785 Hyperlipidemia, unspecified: Secondary | ICD-10-CM

## 2014-07-17 DIAGNOSIS — F29 Unspecified psychosis not due to a substance or known physiological condition: Secondary | ICD-10-CM

## 2014-07-17 DIAGNOSIS — G629 Polyneuropathy, unspecified: Secondary | ICD-10-CM

## 2014-07-17 DIAGNOSIS — E559 Vitamin D deficiency, unspecified: Secondary | ICD-10-CM

## 2014-07-17 DIAGNOSIS — R410 Disorientation, unspecified: Secondary | ICD-10-CM

## 2014-07-17 DIAGNOSIS — E1129 Type 2 diabetes mellitus with other diabetic kidney complication: Secondary | ICD-10-CM

## 2014-07-17 DIAGNOSIS — J189 Pneumonia, unspecified organism: Secondary | ICD-10-CM

## 2014-07-17 LAB — URINALYSIS, ROUTINE W REFLEX MICROSCOPIC
Bilirubin Urine: NEGATIVE
Glucose, UA: NEGATIVE mg/dL
HGB URINE DIPSTICK: NEGATIVE
Ketones, ur: NEGATIVE mg/dL
Nitrite: POSITIVE — AB
PROTEIN: NEGATIVE mg/dL
SPECIFIC GRAVITY, URINE: 1.013 (ref 1.005–1.030)
UROBILINOGEN UA: 0.2 mg/dL (ref 0.0–1.0)
pH: 5 (ref 5.0–8.0)

## 2014-07-17 LAB — CBC WITH DIFFERENTIAL/PLATELET
BASOS ABS: 0 10*3/uL (ref 0.0–0.1)
Basophils Relative: 0 % (ref 0–1)
Eosinophils Absolute: 0.6 10*3/uL (ref 0.0–0.7)
Eosinophils Relative: 5 % (ref 0–5)
HCT: 37.8 % (ref 36.0–46.0)
Hemoglobin: 12.4 g/dL (ref 12.0–15.0)
LYMPHS ABS: 2.9 10*3/uL (ref 0.7–4.0)
LYMPHS PCT: 25 % (ref 12–46)
MCH: 30.8 pg (ref 26.0–34.0)
MCHC: 32.8 g/dL (ref 30.0–36.0)
MCV: 94 fL (ref 78.0–100.0)
Monocytes Absolute: 1 10*3/uL (ref 0.1–1.0)
Monocytes Relative: 8 % (ref 3–12)
NEUTROS PCT: 62 % (ref 43–77)
Neutro Abs: 7.1 10*3/uL (ref 1.7–7.7)
PLATELETS: 264 10*3/uL (ref 150–400)
RBC: 4.02 MIL/uL (ref 3.87–5.11)
RDW: 13.1 % (ref 11.5–15.5)
WBC: 11.6 10*3/uL — AB (ref 4.0–10.5)

## 2014-07-17 LAB — COMPREHENSIVE METABOLIC PANEL
ALK PHOS: 93 U/L (ref 39–117)
ALT: 27 U/L (ref 0–35)
AST: 52 U/L — AB (ref 0–37)
Albumin: 3.7 g/dL (ref 3.5–5.2)
Anion gap: 12 (ref 5–15)
BILIRUBIN TOTAL: 0.5 mg/dL (ref 0.3–1.2)
BUN: 28 mg/dL — ABNORMAL HIGH (ref 6–23)
CALCIUM: 9.5 mg/dL (ref 8.4–10.5)
CHLORIDE: 90 meq/L — AB (ref 96–112)
CO2: 26 meq/L (ref 19–32)
Creatinine, Ser: 1.46 mg/dL — ABNORMAL HIGH (ref 0.50–1.10)
GFR calc Af Amer: 37 mL/min — ABNORMAL LOW (ref >90)
GFR, EST NON AFRICAN AMERICAN: 32 mL/min — AB (ref >90)
Glucose, Bld: 111 mg/dL — ABNORMAL HIGH (ref 70–99)
POTASSIUM: 5.2 meq/L (ref 3.7–5.3)
SODIUM: 128 meq/L — AB (ref 137–147)
Total Protein: 7.7 g/dL (ref 6.0–8.3)

## 2014-07-17 LAB — CBG MONITORING, ED
GLUCOSE-CAPILLARY: 114 mg/dL — AB (ref 70–99)
Glucose-Capillary: 118 mg/dL — ABNORMAL HIGH (ref 70–99)

## 2014-07-17 LAB — APTT: aPTT: 32 seconds (ref 24–37)

## 2014-07-17 LAB — PRO B NATRIURETIC PEPTIDE: PRO B NATRI PEPTIDE: 225.4 pg/mL (ref 0–450)

## 2014-07-17 LAB — URINE MICROSCOPIC-ADD ON

## 2014-07-17 LAB — PROTIME-INR
INR: 1.06 (ref 0.00–1.49)
PROTHROMBIN TIME: 13.8 s (ref 11.6–15.2)

## 2014-07-17 MED ORDER — SULFAMETHOXAZOLE-TMP DS 800-160 MG PO TABS: 1.0000 | ORAL_TABLET | Freq: Two times a day (BID) | ORAL | Status: DC

## 2014-07-17 MED ORDER — VANCOMYCIN HCL 10 G IV SOLR
1500.0000 mg | Freq: Once | INTRAVENOUS | Status: AC
  Administered 2014-07-17: 1500 mg via INTRAVENOUS
  Filled 2014-07-17: qty 1500

## 2014-07-17 MED ORDER — VANCOMYCIN HCL 10 G IV SOLR: 20.0000 mg/kg | Freq: Once | INTRAVENOUS | Status: DC

## 2014-07-17 MED ORDER — SODIUM CHLORIDE 0.9 % IV BOLUS (SEPSIS)
500.0000 mL | Freq: Once | INTRAVENOUS | Status: AC
Start: 2014-07-17 — End: 2014-07-17
  Administered 2014-07-17: 500 mL via INTRAVENOUS

## 2014-07-17 MED ORDER — DEXTROSE 5 % IV SOLN
1.0000 g | Freq: Two times a day (BID) | INTRAVENOUS | Status: DC
  Administered 2014-07-17: 1 g via INTRAVENOUS
  Filled 2014-07-17: qty 1

## 2014-07-17 MED ORDER — SODIUM CHLORIDE 0.9 % IV BOLUS (SEPSIS)
500.0000 mL | Freq: Once | INTRAVENOUS | Status: AC
  Administered 2014-07-17: 500 mL via INTRAVENOUS

## 2014-07-17 NOTE — ED Notes (Signed)
Pt off unit with xray 

## 2014-07-17 NOTE — ED Notes (Signed)
Patient given a meal bag. 

## 2014-07-17 NOTE — ED Provider Notes (Signed)
CSN: [id number]     Arrival date & time 07/17/14  1644 History   First MD Initiated Contact with Patient 07/17/14 1707     Chief Complaint  Patient presents with  . Shortness of Breath  . Altered Mental Status     (Consider location/radiation/quality/duration/timing/severity/associated sxs/prior Treatment) Patient is a 80 y.o. female presenting with general illness. The history is provided by the patient.  Illness Severity:  Mild Onset quality:  Sudden Duration:  1 day Timing:  Constant Progression:  Unchanged Chronicity:  New Associated symptoms: congestion, cough and fatigue   Associated symptoms: no chest pain, no fever, no headaches, no myalgias, no nausea, no rhinorrhea, no shortness of breath, no vomiting and no wheezing     80 yo female with a chief complaint shortness of breath and confusion. Patient states this started this morning. Some confusion with her where she was some difficulty with speech as well per family. This has happened previously when she was diagnosed with a urinary tract infection had come to the hospital. Patient has been treated with 3 separate antibiotics since that time. PT feels that this has not resolved since then. Patient currently on Bactrim. Patient notices some productive cough. Patient feels that she is having some subjective shortness of breath as well. Patient denies chest pain denies abdominal pain denies any head injuries is not taking blood thinners.  Past Medical History  Diagnosis Date  . Type II or unspecified type diabetes mellitus without mention of complication, not stated as uncontrolled   . Hypertension   . Hyperlipidemia   . Neuropathy   . Hypothyroidism   . Anemia   . Glaucoma   . Hyperglycemia 04/2014   Past Surgical History  Procedure Laterality Date  . Breast surgery    . Cholecystectomy    . Thyroidectomy     Family History  Problem Relation Age of Onset  . Heart disease Mother   . Cancer Other   . Heart attack  Other    History  Substance Use Topics  . Smoking status: Former Research scientist (life sciences)  . Smokeless tobacco: Never Used  . Alcohol Use: No   OB History   Grav Para Term Preterm Abortions TAB SAB Ect Mult Living                 Review of Systems  Constitutional: Positive for fatigue. Negative for fever and chills.  HENT: Positive for congestion. Negative for rhinorrhea.   Eyes: Negative for redness and visual disturbance.  Respiratory: Positive for cough. Negative for shortness of breath and wheezing.   Cardiovascular: Negative for chest pain and palpitations.  Gastrointestinal: Negative for nausea and vomiting.  Genitourinary: Negative for dysuria and urgency.  Musculoskeletal: Negative for arthralgias and myalgias.  Skin: Negative for pallor and wound.  Neurological: Negative for dizziness and headaches.      Allergies  Codeine and Macrobid  Home Medications   Prior to Admission medications   Medication Sig Start Date End Date Taking? Authorizing Provider  ALPRAZolam Duanne Moron) 0.5 MG tablet Take 0.5 mg by mouth at bedtime. 06/09/14  Yes Unk Pinto, MD  amLODipine (NORVASC) 5 MG tablet Take 5 mg by mouth daily.   Yes Historical Provider, MD  Ascorbic Acid (VITAMIN C) 1000 MG tablet Take 1,000 mg by mouth 3 (three) times daily. For urine infection   Yes Historical Provider, MD  aspirin EC 81 MG tablet Take 81 mg by mouth daily.   Yes Historical Provider, MD  bimatoprost (LUMIGAN) 0.01 %  SOLN Place 1 drop into both eyes at bedtime.   Yes Historical Provider, MD  CALCIUM-MAGNESIUM-ZINC PO Take 1 tablet by mouth daily.   Yes Historical Provider, MD  Carbonyl Iron (PERFECT IRON PO) Take 1 tablet by mouth daily.   Yes Historical Provider, MD  Cholecalciferol (VITAMIN D PO) Take 1,000 Units by mouth daily.   Yes Historical Provider, MD  cycloSPORINE (RESTASIS) 0.05 % ophthalmic emulsion Place 1 drop into both eyes at bedtime.    Yes Historical Provider, MD  dorzolamide-timolol (COSOPT)  22.3-6.8 MG/ML ophthalmic solution Place 1 drop into both eyes 2 (two) times daily.  03/21/14  Yes Historical Provider, MD  ezetimibe (ZETIA) 10 MG tablet Take 10 mg by mouth daily.   Yes Historical Provider, MD  gabapentin (NEURONTIN) 600 MG tablet Take 600 mg by mouth 4 (four) times daily. for neuritis pain 05/17/14 05/18/15 Yes Unk Pinto, MD  hydrochlorothiazide (HYDRODIURIL) 25 MG tablet Take 25 mg by mouth daily.  03/17/14  Yes Historical Provider, MD  levothyroxine (SYNTHROID, LEVOTHROID) 50 MCG tablet Take 50 mcg by mouth daily before breakfast.   Yes Historical Provider, MD  lisinopril (PRINIVIL,ZESTRIL) 10 MG tablet Take 1 tablet (10 mg total) by mouth daily. 05/13/14  Yes Ripudeep Krystal Eaton, MD  meclizine (ANTIVERT) 25 MG tablet Take 12.5-25 mg by mouth 3 (three) times daily as needed for dizziness (vertigo).    Yes Historical Provider, MD  metFORMIN (GLUCOPHAGE-XR) 500 MG 24 hr tablet Take 500 mg by mouth 3 (three) times daily after meals. Takes 2 tabs at lunch if glucose above 200   Yes Historical Provider, MD  Multiple Vitamin (MULITIVITAMIN WITH MINERALS) TABS Take 1 tablet by mouth daily.   Yes Historical Provider, MD  omega-3 acid ethyl esters (LOVAZA) 1 G capsule Take 1 g by mouth 2 (two) times daily.    Yes Historical Provider, MD  sulfamethoxazole-trimethoprim (BACTRIM DS) 800-160 MG per tablet Take 1 tablet by mouth 2 (two) times daily. Started on 07-17-14 07/17/14  Yes Unk Pinto, MD  traMADol (ULTRAM) 50 MG tablet Take 50 mg by mouth 3 (three) times daily as needed (pain).   Yes Historical Provider, MD  vitamin E 400 UNIT capsule Take 400 Units by mouth daily.   Yes Historical Provider, MD  White Petrolatum-Mineral Oil (GENTEAL PM OP) Place 1 drop into both eyes at bedtime.   Yes Historical Provider, MD  amoxicillin (AMOXIL) 250 MG capsule Take 250 mg by mouth See admin instructions. Take 1 capsule 3 x day after meals for 10 days, then 1 capsule at bedtime for 2 weeks for UTI.Marland Kitchen  Completed course of medication a month ago from today (07-17-14) 06/14/14   Unk Pinto, MD   BP 129/50  Pulse 72  Temp(Src) 98 F (36.7 C) (Oral)  Resp 10  Ht 5' 1.42" (1.56 m)  Wt 160 lb 11.5 oz (72.9 kg)  BMI 29.96 kg/m2  SpO2 96% Physical Exam  Constitutional: She is oriented to person, place, and time. She appears well-developed. No distress.  HENT:  Head: Normocephalic and atraumatic.  Eyes: EOM are normal. Pupils are equal, round, and reactive to light.  Neck: Normal range of motion. Neck supple.  Cardiovascular: Normal rate and regular rhythm.  Exam reveals no gallop and no friction rub.   No murmur heard. Pulmonary/Chest: Effort normal. She has no wheezes. She has no rales. She exhibits no tenderness.  Abdominal: Soft. She exhibits no distension. There is no tenderness. There is no rebound and no guarding.  Musculoskeletal: She exhibits no edema and no tenderness.  Neurological: She is alert and oriented to person, place, and time. GCS eye subscore is 4. GCS verbal subscore is 4. GCS motor subscore is 6.  Skin: Skin is warm and dry. She is not diaphoretic.  Psychiatric: She has a normal mood and affect. Her behavior is normal.    ED Course  Procedures (including critical care time) Labs Review Labs Reviewed  CBC WITH DIFFERENTIAL - Abnormal; Notable for the following:    WBC 11.6 (*)    All other components within normal limits  COMPREHENSIVE METABOLIC PANEL - Abnormal; Notable for the following:    Sodium 128 (*)    Chloride 90 (*)    Glucose, Bld 111 (*)    BUN 28 (*)    Creatinine, Ser 1.46 (*)    AST 52 (*)    GFR calc non Af Amer 32 (*)    GFR calc Af Amer 37 (*)    All other components within normal limits  URINALYSIS, ROUTINE W REFLEX MICROSCOPIC - Abnormal; Notable for the following:    APPearance CLOUDY (*)    Nitrite POSITIVE (*)    Leukocytes, UA MODERATE (*)    All other components within normal limits  URINE MICROSCOPIC-ADD ON - Abnormal; Notable  for the following:    Squamous Epithelial / LPF FEW (*)    Bacteria, UA MANY (*)    All other components within normal limits  CBG MONITORING, ED - Abnormal; Notable for the following:    Glucose-Capillary 114 (*)    All other components within normal limits  CBG MONITORING, ED - Abnormal; Notable for the following:    Glucose-Capillary 118 (*)    All other components within normal limits  URINE CULTURE  PRO B NATRIURETIC PEPTIDE  PROTIME-INR  APTT  I-STAT TROPOININ, ED  I-STAT CG4 LACTIC ACID, ED    Imaging Review Dg Chest 2 View  07/17/2014   CLINICAL DATA:  Shortness of breath.  Altered mental status.  EXAM: CHEST  2 VIEW  COMPARISON:  05/10/2014.  FINDINGS: Stable enlarged cardiac silhouette. The pulmonary vasculature remains mildly prominent. Decreased prominence of the interstitial markings. Clear lungs. No pleural fluid. Thoracic spine degenerative changes. Diffuse osteopenia. Left lower neck surgical clips.  IMPRESSION: Stable cardiomegaly.  No acute abnormality.   Electronically Signed   By: Enrique Sack M.D.   On: 07/17/2014 18:48   Ct Head Wo Contrast  07/17/2014   CLINICAL DATA:  Shortness of breath and altered mental status  EXAM: CT HEAD WITHOUT CONTRAST  TECHNIQUE: Contiguous axial images were obtained from the base of the skull through the vertex without intravenous contrast.  COMPARISON:  None currently available  FINDINGS: Skull and Sinuses:Negative for fracture or destructive process. The mastoids, middle ears, and imaged paranasal sinuses are clear.  Orbits: Bilateral cataract resection.  Brain: No evidence of acute abnormality, such as acute infarction, hemorrhage, hydrocephalus, or mass lesion/mass effect. There is confluent cerebral white matter low density consistent with chronic small vessel disease. Chronic ischemic changes also affect the thalami. Cerebral volume loss which is age appropriate.  IMPRESSION: 1. No acute intracranial abnormality. 2. Extensive chronic small  vessel disease.   Electronically Signed   By: Jorje Guild M.D.   On: 07/17/2014 22:13     EKG Interpretation   Date/Time:  Monday July 17 2014 16:53:57 EDT Ventricular Rate:  69 PR Interval:  170 QRS Duration: 74 QT Interval:  358 QTC Calculation: 383 R  Axis:   51 Text Interpretation:  Normal sinus rhythm Normal ECG No significant change  since last tracing Confirmed by GOLDSTON  MD, SCOTT ([id number]) on 07/17/2014  6:04:01 PM      MDM   Final diagnoses:  SOB (shortness of breath)  Acute renal insufficiency  Confusion  Encephalopathy acute  Hyponatremia  Unspecified essential hypertension  UTI (lower urinary tract infection)    Patient is a 80 y.o. female who presents with shortness of breath and confusion.  This started this morning when she woke up. Recently admitted secondary to a urinary tract infection with sepsis. Patient was treated with multiple antibiotics for this. He feels that this has not yet resolved. Patient now hypoxic with some confusion most likely secondary to hospital acquired pneumonia. Will obtain a chest x-ray urine CBC BMP    Continued UTI, likely pna clinically, treated with vanc, cefep.    Spoke with hospitalist will come to eval patient.   Will admit.   Deno Etienne, MD 07/18/14 [id number]

## 2014-07-17 NOTE — ED Notes (Signed)
Pt family reports patient has been having difficulty breathing, confusion, slurred speech he noticed since she got up.  Pt answers questions, reports pain in chest area.  Pt has sat 89% RA and appears to be taking deep breaths.  Pt has been being treated for uti for a while now, switched anbx. Family reports patient falls asleep.

## 2014-07-17 NOTE — ED Notes (Signed)
Family at bedside. 

## 2014-07-17 NOTE — ED Notes (Signed)
Pt has prolapse and was unable to urinary cath at this time.

## 2014-07-17 NOTE — H&P (Signed)
PCP:   [name], MD   Chief Complaint:  Confusion, sob  HPI: 80 yo female h/o recurrent uti, htn, hld lives alone brought in by her daughter for ams.  Pt saw doctor today and given bactrim for another uti, but has not started it.  She got more confused so family brought her in.  Over the weekend, she was in her normal state of health with no issues, no sob no cp no n/v/d.  Today she has been coughing more than usual and c/o sob no cp.  No fevers.  No diarrhea.  No abd pain.  No focal neuro deficits except the confusion.  Review of Systems:  Positive and negative as per HPI otherwise all other systems are negative per daughter  Past Medical History: Past Medical History  Diagnosis Date  . Type II or unspecified type diabetes mellitus without mention of complication, not stated as uncontrolled   . Hypertension   . Hyperlipidemia   . Neuropathy   . Hypothyroidism   . Anemia   . Glaucoma   . Hyperglycemia 04/2014   Past Surgical History  Procedure Laterality Date  . Breast surgery    . Cholecystectomy    . Thyroidectomy      Medications: Prior to Admission medications   Medication Sig Start Date End Date Taking? Authorizing Provider  ALPRAZolam Duanne Moron) 0.5 MG tablet Take 0.5 mg by mouth at bedtime. 06/09/14  Yes Unk Pinto, MD  amLODipine (NORVASC) 5 MG tablet Take 5 mg by mouth daily.   Yes Historical Provider, MD  Ascorbic Acid (VITAMIN C) 1000 MG tablet Take 1,000 mg by mouth 3 (three) times daily. For urine infection   Yes Historical Provider, MD  aspirin EC 81 MG tablet Take 81 mg by mouth daily.   Yes Historical Provider, MD  bimatoprost (LUMIGAN) 0.01 % SOLN Place 1 drop into both eyes at bedtime.   Yes Historical Provider, MD  CALCIUM-MAGNESIUM-ZINC PO Take 1 tablet by mouth daily.   Yes Historical Provider, MD  Carbonyl Iron (PERFECT IRON PO) Take 1 tablet by mouth daily.   Yes Historical Provider, MD  Cholecalciferol (VITAMIN D PO) Take 1,000 Units by  mouth daily.   Yes Historical Provider, MD  cycloSPORINE (RESTASIS) 0.05 % ophthalmic emulsion Place 1 drop into both eyes at bedtime.    Yes Historical Provider, MD  dorzolamide-timolol (COSOPT) 22.3-6.8 MG/ML ophthalmic solution Place 1 drop into both eyes 2 (two) times daily.  03/21/14  Yes Historical Provider, MD  ezetimibe (ZETIA) 10 MG tablet Take 10 mg by mouth daily.   Yes Historical Provider, MD  gabapentin (NEURONTIN) 600 MG tablet Take 600 mg by mouth 4 (four) times daily. for neuritis pain 05/17/14 05/18/15 Yes Unk Pinto, MD  hydrochlorothiazide (HYDRODIURIL) 25 MG tablet Take 25 mg by mouth daily.  03/17/14  Yes Historical Provider, MD  levothyroxine (SYNTHROID, LEVOTHROID) 50 MCG tablet Take 50 mcg by mouth daily before breakfast.   Yes Historical Provider, MD  lisinopril (PRINIVIL,ZESTRIL) 10 MG tablet Take 1 tablet (10 mg total) by mouth daily. 05/13/14  Yes Ripudeep Krystal Eaton, MD  meclizine (ANTIVERT) 25 MG tablet Take 12.5-25 mg by mouth 3 (three) times daily as needed for dizziness (vertigo).    Yes Historical Provider, MD  metFORMIN (GLUCOPHAGE-XR) 500 MG 24 hr tablet Take 500 mg by mouth 3 (three) times daily after meals. Takes 2 tabs at lunch if glucose above 200   Yes Historical Provider, MD  Multiple Vitamin (MULITIVITAMIN WITH MINERALS) TABS  Take 1 tablet by mouth daily.   Yes Historical Provider, MD  omega-3 acid ethyl esters (LOVAZA) 1 G capsule Take 1 g by mouth 2 (two) times daily.    Yes Historical Provider, MD  sulfamethoxazole-trimethoprim (BACTRIM DS) 800-160 MG per tablet Take 1 tablet by mouth 2 (two) times daily. Started on 07-17-14 07/17/14  Yes Unk Pinto, MD  traMADol (ULTRAM) 50 MG tablet Take 50 mg by mouth 3 (three) times daily as needed (pain).   Yes Historical Provider, MD  vitamin E 400 UNIT capsule Take 400 Units by mouth daily.   Yes Historical Provider, MD  White Petrolatum-Mineral Oil (GENTEAL PM OP) Place 1 drop into both eyes at bedtime.   Yes Historical  Provider, MD  amoxicillin (AMOXIL) 250 MG capsule Take 250 mg by mouth See admin instructions. Take 1 capsule 3 x day after meals for 10 days, then 1 capsule at bedtime for 2 weeks for UTI.Marland Kitchen Completed course of medication a month ago from today (07-17-14) 06/14/14   Unk Pinto, MD    Allergies:   Allergies  Allergen Reactions  . Codeine Rash  . Macrobid [Nitrofurantoin Macrocrystal] Rash    Social History:  reports that she has quit smoking. She has never used smokeless tobacco. She reports that she does not drink alcohol or use illicit drugs.  Family History: Family History  Problem Relation Age of Onset  . Heart disease Mother   . Cancer Other   . Heart attack Other     Physical Exam: Filed Vitals:   07/17/14 2200 07/17/14 2210 07/17/14 2215 07/17/14 2230  BP: 111/49 111/49 124/94 132/64  Pulse: 76 78 76 77  Temp:      TempSrc:      Resp: 12 14 11 11   Height:      Weight:      SpO2: 92% 90% 97% 92%   General appearance: alert, cooperative, no distress and slowed mentation Head: Normocephalic, without obvious abnormality, atraumatic Eyes: negative Nose: Nares normal. Septum midline. Mucosa normal. No drainage or sinus tenderness. Neck: no JVD and supple, symmetrical, trachea midline Lungs: rhonchi bibasilar Heart: regular rate and rhythm, S1, S2 normal, no murmur, click, rub or gallop Abdomen: soft, non-tender; bowel sounds normal; no masses,  no organomegaly Extremities: edema trace Pulses: 2+ and symmetric Skin: Skin color, texture, turgor normal. No rashes or lesions Neurologic: Grossly normal    Labs on Admission:   Recent Labs  07/17/14 1744  NA 128*  K 5.2  CL 90*  CO2 26  GLUCOSE 111*  BUN 28*  CREATININE 1.46*  CALCIUM 9.5    Recent Labs  07/17/14 1744  AST 52*  ALT 27  ALKPHOS 93  BILITOT 0.5  PROT 7.7  ALBUMIN 3.7    Recent Labs  07/17/14 1744  WBC 11.6*  NEUTROABS 7.1  HGB 12.4  HCT 37.8  MCV 94.0  PLT 264    Radiological Exams on Admission: Dg Chest 2 View  07/17/2014   CLINICAL DATA:  Shortness of breath.  Altered mental status.  EXAM: CHEST  2 VIEW  COMPARISON:  05/10/2014.  FINDINGS: Stable enlarged cardiac silhouette. The pulmonary vasculature remains mildly prominent. Decreased prominence of the interstitial markings. Clear lungs. No pleural fluid. Thoracic spine degenerative changes. Diffuse osteopenia. Left lower neck surgical clips.  IMPRESSION: Stable cardiomegaly.  No acute abnormality.   Electronically Signed   By: Enrique Sack M.D.   On: 07/17/2014 18:48   Ct Head Wo Contrast  07/17/2014  CLINICAL DATA:  Shortness of breath and altered mental status  EXAM: CT HEAD WITHOUT CONTRAST  TECHNIQUE: Contiguous axial images were obtained from the base of the skull through the vertex without intravenous contrast.  COMPARISON:  None currently available  FINDINGS: Skull and Sinuses:Negative for fracture or destructive process. The mastoids, middle ears, and imaged paranasal sinuses are clear.  Orbits: Bilateral cataract resection.  Brain: No evidence of acute abnormality, such as acute infarction, hemorrhage, hydrocephalus, or mass lesion/mass effect. There is confluent cerebral white matter low density consistent with chronic small vessel disease. Chronic ischemic changes also affect the thalami. Cerebral volume loss which is age appropriate.  IMPRESSION: 1. No acute intracranial abnormality. 2. Extensive chronic small vessel disease.   Electronically Signed   By: Jorje Guild M.D.   On: 07/17/2014 22:13    Assessment/Plan  80 yo female with encephalopathy likely secondary to uti and possible pna  Principal Problem:   Encephalopathy acute-  Treat underlying infections.  See below.  cth neg.  No focal deficits neurologically.  Active Problems:   Hypertension   UTI (lower urinary tract infection)-  Has h/o recent VRE, pansens ecoli.  Klebsiella.  All in last 3 months.  Place on vanco and cefepime  until culture returns.   SOB (shortness of breath)-  Due to sob, mild hypoxia, lung exam and cough will also treat empirically for pna, repeat cxr in am   Hypoxia  As above   Hyponatremia  Hold hctz   Acute renal insufficiency  Has received over a liter of ivf already, will hold off on any further ivf for now.  Falon Flinchum A 07/17/2014, 11:01 PM

## 2014-07-18 ENCOUNTER — Inpatient Hospital Stay (HOSPITAL_COMMUNITY): Payer: Medicare Other

## 2014-07-18 ENCOUNTER — Encounter (HOSPITAL_COMMUNITY): Payer: Self-pay | Admitting: General Practice

## 2014-07-18 DIAGNOSIS — E1129 Type 2 diabetes mellitus with other diabetic kidney complication: Secondary | ICD-10-CM

## 2014-07-18 LAB — BASIC METABOLIC PANEL
Anion gap: 11 (ref 5–15)
BUN: 26 mg/dL — ABNORMAL HIGH (ref 6–23)
CHLORIDE: 98 meq/L (ref 96–112)
CO2: 23 mEq/L (ref 19–32)
Calcium: 8.7 mg/dL (ref 8.4–10.5)
Creatinine, Ser: 1.15 mg/dL — ABNORMAL HIGH (ref 0.50–1.10)
GFR calc Af Amer: 49 mL/min — ABNORMAL LOW (ref >90)
GFR calc non Af Amer: 42 mL/min — ABNORMAL LOW (ref >90)
GLUCOSE: 124 mg/dL — AB (ref 70–99)
POTASSIUM: 4.7 meq/L (ref 3.7–5.3)
Sodium: 132 mEq/L — ABNORMAL LOW (ref 137–147)

## 2014-07-18 LAB — TROPONIN I

## 2014-07-18 LAB — CBC
HCT: 34.5 % — ABNORMAL LOW (ref 36.0–46.0)
HEMOGLOBIN: 11.1 g/dL — AB (ref 12.0–15.0)
MCH: 30.2 pg (ref 26.0–34.0)
MCHC: 32.2 g/dL (ref 30.0–36.0)
MCV: 94 fL (ref 78.0–100.0)
PLATELETS: 216 10*3/uL (ref 150–400)
RBC: 3.67 MIL/uL — ABNORMAL LOW (ref 3.87–5.11)
RDW: 13 % (ref 11.5–15.5)
WBC: 9 10*3/uL (ref 4.0–10.5)

## 2014-07-18 LAB — TSH: TSH: 1.16 u[IU]/mL (ref 0.350–4.500)

## 2014-07-18 LAB — VITAMIN B12: VITAMIN B 12: 624 pg/mL (ref 211–911)

## 2014-07-18 LAB — T4, FREE: FREE T4: 1.53 ng/dL (ref 0.80–1.80)

## 2014-07-18 MED ORDER — SODIUM CHLORIDE 0.9 % IJ SOLN
3.0000 mL | Freq: Two times a day (BID) | INTRAMUSCULAR | Status: DC
  Administered 2014-07-18 – 2014-07-19 (×4): 3 mL via INTRAVENOUS

## 2014-07-18 MED ORDER — ENOXAPARIN SODIUM 40 MG/0.4ML ~~LOC~~ SOLN
40.0000 mg | SUBCUTANEOUS | Status: DC
  Administered 2014-07-18 – 2014-07-21 (×4): 40 mg via SUBCUTANEOUS
  Filled 2014-07-18 (×4): qty 0.4

## 2014-07-18 MED ORDER — INSULIN ASPART 100 UNIT/ML ~~LOC~~ SOLN
0.0000 [IU] | Freq: Three times a day (TID) | SUBCUTANEOUS | Status: DC
  Administered 2014-07-20: 3 [IU] via SUBCUTANEOUS
  Administered 2014-07-20: 2 [IU] via SUBCUTANEOUS
  Administered 2014-07-20: 1 [IU] via SUBCUTANEOUS
  Administered 2014-07-21 (×2): 2 [IU] via SUBCUTANEOUS

## 2014-07-18 MED ORDER — GABAPENTIN 600 MG PO TABS
600.0000 mg | ORAL_TABLET | Freq: Four times a day (QID) | ORAL | Status: DC
  Administered 2014-07-18 – 2014-07-21 (×13): 600 mg via ORAL
  Filled 2014-07-18 (×16): qty 1

## 2014-07-18 MED ORDER — MECLIZINE HCL 12.5 MG PO TABS
12.5000 mg | ORAL_TABLET | Freq: Three times a day (TID) | ORAL | Status: DC | PRN
  Filled 2014-07-18: qty 2

## 2014-07-18 MED ORDER — CYCLOSPORINE 0.05 % OP EMUL
1.0000 [drp] | Freq: Every day | OPHTHALMIC | Status: DC
  Administered 2014-07-18 – 2014-07-20 (×3): 1 [drp] via OPHTHALMIC
  Filled 2014-07-18 (×5): qty 1

## 2014-07-18 MED ORDER — SODIUM CHLORIDE 0.9 % IJ SOLN
3.0000 mL | Freq: Two times a day (BID) | INTRAMUSCULAR | Status: DC
  Administered 2014-07-19 – 2014-07-21 (×6): 3 mL via INTRAVENOUS

## 2014-07-18 MED ORDER — OMEGA-3-ACID ETHYL ESTERS 1 G PO CAPS
1.0000 g | ORAL_CAPSULE | Freq: Two times a day (BID) | ORAL | Status: DC
  Administered 2014-07-18 – 2014-07-21 (×7): 1 g via ORAL
  Filled 2014-07-18 (×8): qty 1

## 2014-07-18 MED ORDER — DEXTROSE 5 % IV SOLN
1.0000 g | INTRAVENOUS | Status: DC
  Administered 2014-07-18 – 2014-07-21 (×4): 1 g via INTRAVENOUS
  Filled 2014-07-18 (×5): qty 10

## 2014-07-18 MED ORDER — VANCOMYCIN HCL IN DEXTROSE 750-5 MG/150ML-% IV SOLN
750.0000 mg | INTRAVENOUS | Status: DC
  Filled 2014-07-18: qty 150

## 2014-07-18 MED ORDER — ASPIRIN EC 81 MG PO TBEC
81.0000 mg | DELAYED_RELEASE_TABLET | Freq: Every day | ORAL | Status: DC
  Administered 2014-07-18 – 2014-07-21 (×4): 81 mg via ORAL
  Filled 2014-07-18 (×4): qty 1

## 2014-07-18 MED ORDER — ALPRAZOLAM 0.5 MG PO TABS
0.5000 mg | ORAL_TABLET | Freq: Every day | ORAL | Status: DC
  Administered 2014-07-18 – 2014-07-20 (×3): 0.5 mg via ORAL
  Filled 2014-07-18 (×3): qty 1

## 2014-07-18 MED ORDER — SODIUM CHLORIDE 0.9 % IJ SOLN: 3.0000 mL | INTRAMUSCULAR | Status: DC | PRN

## 2014-07-18 MED ORDER — ADULT MULTIVITAMIN W/MINERALS CH
1.0000 | ORAL_TABLET | Freq: Every day | ORAL | Status: DC
  Administered 2014-07-18 – 2014-07-21 (×4): 1 via ORAL
  Filled 2014-07-18 (×4): qty 1

## 2014-07-18 MED ORDER — SODIUM CHLORIDE 0.9 % IV SOLN: 250.0000 mL | INTRAVENOUS | Status: DC | PRN

## 2014-07-18 MED ORDER — LISINOPRIL 10 MG PO TABS
10.0000 mg | ORAL_TABLET | Freq: Every day | ORAL | Status: DC
  Administered 2014-07-18 – 2014-07-21 (×4): 10 mg via ORAL
  Filled 2014-07-18 (×4): qty 1

## 2014-07-18 MED ORDER — VITAMIN E 180 MG (400 UNIT) PO CAPS
400.0000 [IU] | ORAL_CAPSULE | Freq: Every day | ORAL | Status: DC
  Administered 2014-07-18 – 2014-07-21 (×4): 400 [IU] via ORAL
  Filled 2014-07-18 (×4): qty 1

## 2014-07-18 MED ORDER — DEXTROSE 5 % IV SOLN
1.0000 g | INTRAVENOUS | Status: DC
  Filled 2014-07-18: qty 1

## 2014-07-18 MED ORDER — EZETIMIBE 10 MG PO TABS
10.0000 mg | ORAL_TABLET | Freq: Every day | ORAL | Status: DC
  Administered 2014-07-18 – 2014-07-21 (×4): 10 mg via ORAL
  Filled 2014-07-18 (×4): qty 1

## 2014-07-18 MED ORDER — LEVOTHYROXINE SODIUM 50 MCG PO TABS
50.0000 ug | ORAL_TABLET | Freq: Every day | ORAL | Status: DC
  Administered 2014-07-18 – 2014-07-21 (×4): 50 ug via ORAL
  Filled 2014-07-18 (×6): qty 1

## 2014-07-18 MED ORDER — DORZOLAMIDE HCL-TIMOLOL MAL 2-0.5 % OP SOLN
1.0000 [drp] | Freq: Two times a day (BID) | OPHTHALMIC | Status: DC
  Administered 2014-07-18 – 2014-07-21 (×7): 1 [drp] via OPHTHALMIC
  Filled 2014-07-18: qty 10

## 2014-07-18 MED ORDER — AMLODIPINE BESYLATE 5 MG PO TABS
5.0000 mg | ORAL_TABLET | Freq: Every day | ORAL | Status: DC
Start: 2014-07-18 — End: 2014-07-21
  Administered 2014-07-18 – 2014-07-21 (×4): 5 mg via ORAL
  Filled 2014-07-18 (×4): qty 1

## 2014-07-18 NOTE — Evaluation (Signed)
Physical Therapy Evaluation Patient Details Name: Melissa Mccullough MRN: [id number] DOB: 1928/01/07 Today's Date: 07/18/2014   History of Present Illness  Pt admitted with hyperglycemia, confusion, UTI and CAP  Clinical Impression  Pt tolerated functional mobility well using the walker, however, when pt started using a single point cane, her balance was greatly challenged and she required hand held assist to ambulate. She presents with a significant fall risk if she is to perform ADLs at home alone. Pt will benefit from continued PT to address functional mobility and balance deficits and maximize independence and mobility.  Her d/c situation is difficult because she wants to d/c home but needs 24 hour assist and is likely family cannot provide it. Another option is to d/c to SNF for short-term rehab in order to address balance deficits. However, this is a short-term solution and family is considering assisted living options as a long-term solution.  Discussed plan of care with SW.    Follow Up Recommendations Home health PT;SNF;Supervision/Assistance - 24 hour (see above comments) If pt is to d/c home would recommend HHPT, HHRN, HHSW, HHAide, 24 hour assistance    Equipment Recommendations  Rolling walker with 5" wheels;3in1 (PT)    Recommendations for Other Services OT consult     Precautions / Restrictions Precautions Precautions: Fall Restrictions Weight Bearing Restrictions: No      Mobility  Bed Mobility                  Transfers Overall transfer level: Needs assistance Equipment used: Rolling walker (2 wheeled) Transfers: Sit to/from Stand Sit to Stand: Min assist         General transfer comment: Pt relied heavily on B UE to push up from the recliner  Ambulation/Gait Ambulation/Gait assistance: Modified independent (Device/Increase time);Mod assist (Mod I for RW, Mod A for single point cane) Ambulation Distance (Feet): 90 Feet (50 with RW, 20 with SPC, 20  with RW) Assistive device: Rolling walker (2 wheeled);Straight cane;1 person hand held assist (Hand held with cane) Gait Pattern/deviations: WFL(Within Functional Limits);Step-through pattern;Staggering left;Staggering right;Drifts right/left     General Gait Details: Pt ambulated at a good speed with both RW and cane. Noted lateral trunk lean on both sides especially with the cane, possible Trendelenburg?   Stairs            Wheelchair Mobility    Modified Rankin (Stroke Patients Only)       Balance Overall balance assessment: Needs assistance;History of Falls         Standing balance support: Bilateral upper extremity supported Standing balance-Leahy Scale: Poor                               Pertinent Vitals/Pain no apparent distress     Home Living Family/patient expects to be discharged to:: Unsure Living Arrangements: Alone               Additional Comments: Pt expects to go home but daughter, [name], stated that she needs to be put into a nursing home for her safety since [name] and her brother cannot provide 24 hour support    Prior Function Level of Independence: Independent with assistive device(s);Needs assistance         Comments: pt typically uses a cane. She performs housework and cooking and she only leaves if son comes to drive her. She calls her son [name] before and after she gets out of the shower  to let him know she's safe     Hand Dominance        Extremity/Trunk Assessment   Upper Extremity Assessment: Defer to OT evaluation           Lower Extremity Assessment: Generalized weakness      Cervical / Trunk Assessment: Kyphotic  Communication   Communication: No difficulties  Cognition Arousal/Alertness: Awake/alert Behavior During Therapy: WFL for tasks assessed/performed Overall Cognitive Status:  Pt's daughter has voiced concerns of leaving stove on and burning herself, problems with managing medication,  having recent discussions with family members who have long since passed away. Could this be onset of dementia?                      General Comments      Exercises        Assessment/Plan    PT Assessment Patient needs continued PT services  PT Diagnosis Abnormality of gait;Generalized weakness   PT Problem List Decreased balance;Decreased mobility;Decreased cognition;Decreased knowledge of use of DME;Decreased safety awareness  PT Treatment Interventions DME instruction;Gait training;Functional mobility training;Balance training;Therapeutic activities   PT Goals (Current goals can be found in the Care Plan section) Acute Rehab PT Goals Patient Stated Goal: To return home PT Goal Formulation: With patient/family Time For Goal Achievement: 07/25/14 Potential to Achieve Goals: Good    Frequency Min 3X/week   Barriers to discharge Decreased caregiver support Pt's children are unable to provide 24 hour support. See above "Overall Cognitive Status" comments.     Co-evaluation               End of Session Equipment Utilized During Treatment: Gait belt Activity Tolerance: Patient tolerated treatment well Patient left: in chair;with call bell/phone within reach;with family/visitor present           Time: 0623-7628 PT Time Calculation (min): 32 min   Charges:         PT G Codes:          Junita Push 07/18/2014, 2:09 PM  Adriana Reams, SPT Acute Rehabilitation Services Office Number: [phone]

## 2014-07-18 NOTE — ED Provider Notes (Signed)
I saw and evaluated the patient, reviewed the resident's note and I agree with the findings and plan.   EKG Interpretation   Date/Time:  Monday July 17 2014 16:53:57 EDT Ventricular Rate:  69 PR Interval:  170 QRS Duration: 74 QT Interval:  358 QTC Calculation: 383 R Axis:   51 Text Interpretation:  Normal sinus rhythm Normal ECG No significant change  since last tracing Confirmed by Xiara Knisley  MD, Eutha Cude ([id number]) on 07/17/2014  6:04:01 PM       Patient with intermittent confusion, shortness of breath, and mild hypoxia. Chest x-ray is unremarkable clinically she seems to appear to have pneumonia. Will need admission for IV antibiotics.  Ephraim Hamburger, MD 07/18/14 [phone]

## 2014-07-18 NOTE — Progress Notes (Signed)
Patient alert and oriented x4, sat in recliner, new iv in forearm receiving iv Rocephin, patient minimal assist with walker to bathroom, eye drops at bedside, no c/o pain, fx bedside, sr

## 2014-07-18 NOTE — Progress Notes (Addendum)
[facility] PROGRESS NOTE  Melissa Mccullough HEN:[id number] DOB: [birth year] DOA: 07/17/2014 PCP: [name], MD  Brief Narrative: 80 yo female h/o recurrent uti, htn, hld lives alone brought in by r daughtheer for ams.  Pt saw PCP day of admission and given bactrim for another uti, but has not started it. She got more confused so family brought her in. Over the weekend, she was in her normal state of health with no issues. Family has noted some cognitive decline over 1 year  Assessment/Plan: 1. UTI -Urine Cx from 3-4days back with [facility], S to cephalosporins -will transition to IV Ceftriaxone, change to PO at discharge -FU repeat Urine Cx  2. MEtabolic encephalopathy -due to 1, improving -Family has noted a short term memory/cognitive and slow functional decline over the last year -Could have Mild Cognitive dysfunction vs Early Dementia -will check TSH/T4, b12 -clinically do not suspect pneumonia, no cough/dyspnea at this time, CXR unremarkable  3. DM 2 -hold metformin, SSI  4. HTN -continue lisinopril, HCTZ on hold  5. Leg swelling -noted by PCP, since HCTZ held -will cut down IVF  DVT proph: lovenox  Code Status: Full Code Family Communication: d/w son at daughter at bedside Disposition Plan: will likely need placement  Antibiotics:  Vanc/Cefepime 83/-8/4  Ceftriaxone 8/4  HPI/Subjective: Feels better, no dyspnea/cough at this time  Objective: Filed Vitals:   07/18/14 0549  BP: 108/71  Pulse: 69  Temp: 97.5 F (36.4 C)  Resp: 14    Intake/Output Summary (Last 24 hours) at 07/18/14 1223 Last data filed at 07/17/14 2234  Gross per 24 hour  Intake   1050 ml  Output    500 ml  Net    550 ml   Filed Weights   07/17/14 1900  Weight: 72.9 kg (160 lb 11.5 oz)    Exam:   General:  AAOx2, disoriented to time  Cardiovascular: S1S2/RRR  Respiratory: CTAB  Abdomen: soft, NT, BS present  Musculoskeletal: 1plus edema bilaterally    Data Reviewed: Basic Metabolic Panel:  Recent Labs Lab 07/17/14 1744 07/18/14 0332  NA 128* 132*  K 5.2 4.7  CL 90* 98  CO2 26 23  GLUCOSE 111* 124*  BUN 28* 26*  CREATININE 1.46* 1.15*  CALCIUM 9.5 8.7   Liver Function Tests:  Recent Labs Lab 07/17/14 1744  AST 52*  ALT 27  ALKPHOS 93  BILITOT 0.5  PROT 7.7  ALBUMIN 3.7   No results found for this basename: LIPASE, AMYLASE,  in the last 168 hours No results found for this basename: AMMONIA,  in the last 168 hours CBC:  Recent Labs Lab 07/17/14 1744 07/18/14 0332  WBC 11.6* 9.0  NEUTROABS 7.1  --   HGB 12.4 11.1*  HCT 37.8 34.5*  MCV 94.0 94.0  PLT 264 216   Cardiac Enzymes:  Recent Labs Lab 07/18/14 0332 07/18/14 0705  TROPONINI <0.30 <0.30   BNP (last 3 results)  Recent Labs  05/10/14 2003 07/17/14 1744  PROBNP 683.2* 225.4   CBG:  Recent Labs Lab 07/17/14 1706 07/17/14 1747  GLUCAP 114* 118*    Recent Results (from the past 240 hour(s))  URINE CULTURE     Status: None   Collection Time    07/13/14 10:57 AM      Result Value Ref Range Status   Culture ESCHERICHIA COLI   Final   Colony Count >=100,000 COLONIES/ML   Final   Organism ID, Bacteria ESCHERICHIA COLI   Final  Studies: Dg Chest 2 View  07/18/2014   CLINICAL DATA:  Cough  EXAM: CHEST  2 VIEW  COMPARISON:  July 17, 2014  FINDINGS: There is no edema or consolidation. Heart is mildly enlarged with pulmonary vascularity within normal limits. No adenopathy. There is atherosclerotic change in aorta. There are surgical clips in the left neck region, stable. There is evidence of calcific tendinosis in the right shoulder.  IMPRESSION: No edema or consolidation.  Stable cardiac prominence.   Electronically Signed   By: Lowella Grip M.D.   On: 07/18/2014 08:09   Dg Chest 2 View  07/17/2014   CLINICAL DATA:  Shortness of breath.  Altered mental status.  EXAM: CHEST  2 VIEW  COMPARISON:  05/10/2014.  FINDINGS: Stable enlarged  cardiac silhouette. The pulmonary vasculature remains mildly prominent. Decreased prominence of the interstitial markings. Clear lungs. No pleural fluid. Thoracic spine degenerative changes. Diffuse osteopenia. Left lower neck surgical clips.  IMPRESSION: Stable cardiomegaly.  No acute abnormality.   Electronically Signed   By: Enrique Sack M.D.   On: 07/17/2014 18:48   Ct Head Wo Contrast  07/17/2014   CLINICAL DATA:  Shortness of breath and altered mental status  EXAM: CT HEAD WITHOUT CONTRAST  TECHNIQUE: Contiguous axial images were obtained from the base of the skull through the vertex without intravenous contrast.  COMPARISON:  None currently available  FINDINGS: Skull and Sinuses:Negative for fracture or destructive process. The mastoids, middle ears, and imaged paranasal sinuses are clear.  Orbits: Bilateral cataract resection.  Brain: No evidence of acute abnormality, such as acute infarction, hemorrhage, hydrocephalus, or mass lesion/mass effect. There is confluent cerebral white matter low density consistent with chronic small vessel disease. Chronic ischemic changes also affect the thalami. Cerebral volume loss which is age appropriate.  IMPRESSION: 1. No acute intracranial abnormality. 2. Extensive chronic small vessel disease.   Electronically Signed   By: Jorje Guild M.D.   On: 07/17/2014 22:13    Scheduled Meds: . ALPRAZolam  0.5 mg Oral QHS  . amLODipine  5 mg Oral Daily  . aspirin EC  81 mg Oral Daily  . ceFEPime (MAXIPIME) IV  1 g Intravenous Q24H  . cycloSPORINE  1 drop Both Eyes QHS  . dorzolamide-timolol  1 drop Both Eyes BID  . enoxaparin (LOVENOX) injection  40 mg Subcutaneous Q24H  . ezetimibe  10 mg Oral Daily  . gabapentin  600 mg Oral QID  . levothyroxine  50 mcg Oral QAC breakfast  . lisinopril  10 mg Oral Daily  . multivitamin with minerals  1 tablet Oral Daily  . omega-3 acid ethyl esters  1 g Oral BID  . sodium chloride  3 mL Intravenous Q12H  . sodium chloride   3 mL Intravenous Q12H  . vancomycin  750 mg Intravenous Q24H  . vitamin E  400 Units Oral Daily   Continuous Infusions:  Antibiotics Given (last 72 hours)   None      Principal Problem:   Encephalopathy acute Active Problems:   Hypertension   UTI (lower urinary tract infection)   SOB (shortness of breath)   Hypoxia   Hyponatremia   Acute renal insufficiency   Confusion    Time spent: 66min    Zoa Dowty  [facility] Pager [phone]. If 7PM-7AM, please contact night-coverage at [email or web address], password [facility] 07/18/2014, 12:23 PM  LOS: 1 day

## 2014-07-18 NOTE — Evaluation (Signed)
Reviewed and agree  Neveah Bang, PT  Acute Rehabilitation Services Pager [phone] Office [phone]  

## 2014-07-18 NOTE — ED Notes (Signed)
Report called to Tina, RN.

## 2014-07-18 NOTE — Clinical Social Work Note (Signed)
Patient assessed today for SNF placement and clinicals transmitted to facilities in [facility] - full assessment to follow.  [name], MSW, LCSW [phone]

## 2014-07-18 NOTE — Progress Notes (Signed)
ANTIBIOTIC CONSULT NOTE - INITIAL  Pharmacy Consult for Vancocin and Maxipime Indication: rule out pneumonia and UTI  Allergies  Allergen Reactions  . Codeine Rash  . Macrobid [Nitrofurantoin Macrocrystal] Rash    Patient Measurements: Height: 5' 1.42" (156 cm) Weight: 160 lb 11.5 oz (72.9 kg) IBW/kg (Calculated) : 48.76  Vital Signs: Temp: 98 F (36.7 C) (08/03 2020) Temp src: Oral (08/03 1749) BP: 129/50 mmHg (08/04 0015) Pulse Rate: 72 (08/04 0015)  Labs:  Recent Labs  07/17/14 1744  WBC 11.6*  HGB 12.4  PLT 264  CREATININE 1.46*   Estimated Creatinine Clearance: 26.4 ml/min (by C-G formula based on Cr of 1.46).   Microbiology: Recent Results (from the past 720 hour(s))  URINE CULTURE     Status: None   Collection Time    07/13/14 10:57 AM      Result Value Ref Range Status   Culture ESCHERICHIA COLI   Final   Colony Count >=100,000 COLONIES/ML   Final   Organism ID, Bacteria ESCHERICHIA COLI   Final    Medical History: Past Medical History  Diagnosis Date  . Type II or unspecified type diabetes mellitus without mention of complication, not stated as uncontrolled   . Hypertension   . Hyperlipidemia   . Neuropathy   . Hypothyroidism   . Anemia   . Glaucoma   . Hyperglycemia 04/2014    Assessment: 80yo female w/ h/o recurrent UTI (including VRE, E.coli, and Klebsiella recently) was seen by MD today and given Bactrim for another UTI but then became confused and presented to ED, noted by family to be coughing more than usual, pt c/o SOB, initial CXR negative, to begin IV ABX for possible PNA and recurrent UTI.  Goal of Therapy:  Vancomycin trough level 15-20 mcg/ml  Plan:  Rec'd vanc 1500mg  and cefepime 1g in ED; will continue with vancomycin 750mg  IV Q24H and cefepime 1g IV Q24H and monitor CBC, Cx, levels prn.  Wynona Neat, PharmD, BCPS  07/18/2014,12:55 AM

## 2014-07-19 DIAGNOSIS — E039 Hypothyroidism, unspecified: Secondary | ICD-10-CM

## 2014-07-19 LAB — CBC
HCT: 37.2 % (ref 36.0–46.0)
Hemoglobin: 12.2 g/dL (ref 12.0–15.0)
MCH: 31 pg (ref 26.0–34.0)
MCHC: 32.8 g/dL (ref 30.0–36.0)
MCV: 94.7 fL (ref 78.0–100.0)
Platelets: 223 10*3/uL (ref 150–400)
RBC: 3.93 MIL/uL (ref 3.87–5.11)
RDW: 12.7 % (ref 11.5–15.5)
WBC: 6.8 10*3/uL (ref 4.0–10.5)

## 2014-07-19 LAB — BASIC METABOLIC PANEL
Anion gap: 11 (ref 5–15)
BUN: 20 mg/dL (ref 6–23)
CALCIUM: 9.5 mg/dL (ref 8.4–10.5)
CO2: 27 mEq/L (ref 19–32)
CREATININE: 0.83 mg/dL (ref 0.50–1.10)
Chloride: 101 mEq/L (ref 96–112)
GFR calc non Af Amer: 63 mL/min — ABNORMAL LOW (ref >90)
GFR, EST AFRICAN AMERICAN: 73 mL/min — AB (ref >90)
Glucose, Bld: 110 mg/dL — ABNORMAL HIGH (ref 70–99)
Potassium: 4.9 mEq/L (ref 3.7–5.3)
Sodium: 139 mEq/L (ref 137–147)

## 2014-07-19 NOTE — Clinical Social Work Placement (Addendum)
Clinical Social Work Department CLINICAL SOCIAL WORK PLACEMENT NOTE 07/19/2014  Patient:  Melissa Mccullough, Melissa Mccullough  Account Number:  [id number] Admit date:  07/17/2014  Clinical Social Worker:  Kieth Hartis Givens, LCSW  Date/time:  07/19/2014 11:05 AM  Clinical Social Work is seeking post-discharge placement for this patient at the following level of care:   SKILLED NURSING   (*CSW will update this form in Epic as items are completed)   07/18/2014  Patient/family provided with [place] Department of Clinical Social Work's list of facilities offering this level of care within the geographic area requested by the patient (or if unable, by the patient's family).  07/18/2014  Patient/family informed of their freedom to choose among providers that offer the needed level of care, that participate in Medicare, Medicaid or managed care program needed by the patient, have an available bed and are willing to accept the patient.    Patient/family informed of [facility]' ownership interest in [facility], as well as of the fact that they are under no obligation to receive care at this facility.  PASARR submitted to EDS on  PASARR number received on   FL2 transmitted to all facilities in geographic area requested by pt/family on  07/18/2014 FL2 transmitted to all facilities within larger geographic area on   Patient informed that his/her managed care company has contracts with or will negotiate with  certain facilities, including the following:     Patient/family informed of bed offers received: 07/19/14  Patient chooses bed at [facility] Physician recommends and patient chooses bed at    Patient to be transferred to [facility] on 07/21/14   Patient to be transferred to facility by son [name] Patient and family notified of transfer on 07/21/14 Name of family member notified: [name] and [name] at the bedside on 8/7.  The following physician request were entered in  Epic:   Additional Comments: 07/19/14: Patient's daughter [name] informed by Claiborne Billings in admissions at [facility] that they do not take patient's insurance. 07/19/14: 4:50 PM - Daughter, [name] and patient given bed offers. She will get with her siblings this evening and provide CSW with a decision tomorrow. 07/20/14: Call made to [name] in admission with [facility]. They can take patient. Son will be contacted by them for completion of paperwork.

## 2014-07-19 NOTE — Progress Notes (Signed)
Physical Therapy Treatment Patient Details Name: Melissa Mccullough MRN: [id number] DOB: 26-Aug-1928 Today's Date: 07/19/2014    History of Present Illness Pt admitted with hyperglycemia, confusion, UTI and CAP    PT Comments    Today's session focused on formal assessment of the pt's balance to determine fall risk and functional impairments as well as walking to measure improvement from yesterday's session. Pt is progressing well with functional mobility, however, her score of 32 on the Berg Balance Scale (results to follow in separate note) puts her in the category of "high risk for falls" and this SPT had to maintain very close guard throughout each task. Recommend d/c to SNF to address balance and functional mobility deficits as pt appears to be agreeable to SNF, per SW note.   Follow Up Recommendations  SNF;Supervision/Assistance - 24 hour     Equipment Recommendations  Rolling walker with 5" wheels;3in1 (PT)    Recommendations for Other Services       Precautions / Restrictions Precautions Precautions: Fall Restrictions Weight Bearing Restrictions: No    Mobility  Bed Mobility                  Transfers Overall transfer level: Needs assistance Equipment used: Rolling walker (2 wheeled) Transfers: Sit to/from [facility] Sit to Stand: Min assist;Mod assist (Min A to start, mod A with 3rd transfer during [place]) Stand pivot transfers: Mod assist       General transfer comment: Pt relied heavily on B UE to push up from the recliner and needed a few VC's for execution of the stand pivot  Ambulation/Gait Ambulation/Gait assistance: Min guard Ambulation Distance (Feet): 150 Feet Assistive device: Rolling walker (2 wheeled) Gait Pattern/deviations: WFL(Within Functional Limits);Step-through pattern;Drifts right/left;Trunk flexed     General Gait Details: Pt continually drifted to the left despite VC's to keep RW straight ahead   Stairs             Wheelchair Mobility    Modified Rankin (Stroke Patients Only)       Balance Overall balance assessment: Needs assistance;History of Falls (See [employer] Test)                                  Cognition Arousal/Alertness: Awake/alert Behavior During Therapy: WFL for tasks assessed/performed Overall Cognitive Status: Impaired/Different from baseline Area of Impairment: Orientation;Memory Orientation Level: Disoriented to;Situation (Oriented to person place and time)   Memory: Decreased short-term memory              Exercises      General Comments        Pertinent Vitals/Pain no apparent distress. Central cardiac monitoring called at end of session and confirmed everything was WNL     Home Living                      Prior Function            PT Goals (current goals can now be found in the care plan section) Acute Rehab PT Goals Patient Stated Goal: To return home PT Goal Formulation: With patient/family Time For Goal Achievement: 07/25/14 Potential to Achieve Goals: Good Progress towards PT goals: Progressing toward goals    Frequency  Min 3X/week    PT Plan Current plan remains appropriate    Co-evaluation             End of Session Equipment  Utilized During Treatment: Gait belt Activity Tolerance: Patient tolerated treatment well Patient left: in chair;with call bell/phone within reach;with family/visitor present     Time: 8338-2505 PT Time Calculation (min): 32 min  Charges:  $Gait Training: 8-22 mins $Therapeutic Activity: 8-22 mins                    G Codes:      Junita Push 2014-08-12, 3:43 PM  Adriana Reams, SPT Acute Rehabilitation Services Office Number: [phone]

## 2014-07-19 NOTE — Progress Notes (Signed)
Reviewed and agree  Militza Devery, PT  Acute Rehabilitation Services Pager [phone] Office [phone]  

## 2014-07-19 NOTE — Progress Notes (Signed)
[facility] PROGRESS NOTE  Melissa Mccullough MEQ:[id number] DOB: 08/15/1928 DOA: 07/17/2014 PCP: [name], MD  Brief Narrative: 80 yo female h/o recurrent uti, htn, hld lives alone brought in by r daughtheer for ams.  Pt saw PCP day of admission and given bactrim for another uti, but has not started it. She got more confused so family brought her in. Over the weekend, she was in her normal state of health with no issues. Family has noted some cognitive decline over 1 year  Assessment/Plan: 1. UTI, Escherichia coli -Urine Cx from 3-4days back with [facility], S to cephalosporins -FU Urine Cx still with Escherichia coli -Continue IV Ceftriaxone, pending sensitivities follow and change to PO at discharge  2. MEtabolic encephalopathy -due to 1, improving -Family has noted a short term memory/cognitive and slow functional decline over the last year -Could have Mild Cognitive dysfunction vs Early Dementia -will check TSH/T4, b12 -clinically do not suspect pneumonia, no cough/dyspnea at this time, CXR unremarkable  3. DM 2 -hold metformin, SSI  4. HTN -continue lisinopril, HCTZ on hold  5. Leg swelling -noted by PCP, since HCTZ held -Status post IVF decrease on 8/4 6. AKI -Resolved with hydration, follow DVT proph: lovenox  Code Status: Full Code Family Communication: None at bedside Disposition Plan: To SNF when medically ready  Antibiotics:  Vanc/Cefepime 83/-8/4  Ceftriaxone 8/4  HPI/Subjective: States cough better today, alert and oriented x3  Objective: Filed Vitals:   07/19/14 1000  BP: 144/60  Pulse: 69  Temp: 98.1 F (36.7 C)  Resp: 15    Intake/Output Summary (Last 24 hours) at 07/19/14 1609 Last data filed at 07/19/14 1300  Gross per 24 hour  Intake    483 ml  Output      0 ml  Net    483 ml   Filed Weights   07/17/14 1900 07/18/14 2052  Weight: 72.9 kg (160 lb 11.5 oz) 72.902 kg (160 lb 11.5 oz)    Exam:   General:  AAOx2,  disoriented to time  Cardiovascular: S1S2/RRR  Respiratory: CTAB  Abdomen: soft, NT, BS present  Musculoskeletal: Decreased edema bilaterally   Data Reviewed: Basic Metabolic Panel:  Recent Labs Lab 07/17/14 1744 07/18/14 0332 07/19/14 0520  NA 128* 132* 139  K 5.2 4.7 4.9  CL 90* 98 101  CO2 26 23 27   GLUCOSE 111* 124* 110*  BUN 28* 26* 20  CREATININE 1.46* 1.15* 0.83  CALCIUM 9.5 8.7 9.5   Liver Function Tests:  Recent Labs Lab 07/17/14 1744  AST 52*  ALT 27  ALKPHOS 93  BILITOT 0.5  PROT 7.7  ALBUMIN 3.7   No results found for this basename: LIPASE, AMYLASE,  in the last 168 hours No results found for this basename: AMMONIA,  in the last 168 hours CBC:  Recent Labs Lab 07/17/14 1744 07/18/14 0332 07/19/14 0520  WBC 11.6* 9.0 6.8  NEUTROABS 7.1  --   --   HGB 12.4 11.1* 12.2  HCT 37.8 34.5* 37.2  MCV 94.0 94.0 94.7  PLT 264 216 223   Cardiac Enzymes:  Recent Labs Lab 07/18/14 0332 07/18/14 0705  TROPONINI <0.30 <0.30   BNP (last 3 results)  Recent Labs  05/10/14 2003 07/17/14 1744  PROBNP 683.2* 225.4   CBG:  Recent Labs Lab 07/17/14 1706 07/17/14 1747  GLUCAP 114* 118*    Recent Results (from the past 240 hour(s))  URINE CULTURE     Status: None   Collection Time  07/13/14 10:57 AM      Result Value Ref Range Status   Culture ESCHERICHIA COLI   Final   Colony Count >=100,000 COLONIES/ML   Final   Organism ID, Bacteria ESCHERICHIA COLI   Final  URINE CULTURE     Status: None   Collection Time    07/17/14  9:43 PM      Result Value Ref Range Status   Specimen Description URINE, CATHETERIZED   Final   Special Requests NONE   Final   Culture  Setup Time     Final   Value: 07/18/2014 04:08     Performed at [facility]     Final   Value: >=100,000 COLONIES/ML     Performed at [facility]   Culture     Final   Value: ESCHERICHIA COLI     Performed at [facility]   Report  Status PENDING   Incomplete  URINE CULTURE     Status: None   Collection Time    07/18/14  8:23 AM      Result Value Ref Range Status   Specimen Description URINE, CLEAN CATCH   Final   Special Requests NONE   Final   Culture  Setup Time     Final   Value: 07/18/2014 16:05     Performed at [facility]     Final   Value: 30,000 COLONIES/ML     Performed at [facility]   Culture     Final   Value: ESCHERICHIA COLI     Performed at [facility]   Report Status PENDING   Incomplete     Studies: Dg Chest 2 View  07/18/2014   CLINICAL DATA:  Cough  EXAM: CHEST  2 VIEW  COMPARISON:  July 17, 2014  FINDINGS: There is no edema or consolidation. Heart is mildly enlarged with pulmonary vascularity within normal limits. No adenopathy. There is atherosclerotic change in aorta. There are surgical clips in the left neck region, stable. There is evidence of calcific tendinosis in the right shoulder.  IMPRESSION: No edema or consolidation.  Stable cardiac prominence.   Electronically Signed   By: Lowella Grip M.D.   On: 07/18/2014 08:09   Dg Chest 2 View  07/17/2014   CLINICAL DATA:  Shortness of breath.  Altered mental status.  EXAM: CHEST  2 VIEW  COMPARISON:  05/10/2014.  FINDINGS: Stable enlarged cardiac silhouette. The pulmonary vasculature remains mildly prominent. Decreased prominence of the interstitial markings. Clear lungs. No pleural fluid. Thoracic spine degenerative changes. Diffuse osteopenia. Left lower neck surgical clips.  IMPRESSION: Stable cardiomegaly.  No acute abnormality.   Electronically Signed   By: Enrique Sack M.D.   On: 07/17/2014 18:48   Ct Head Wo Contrast  07/17/2014   CLINICAL DATA:  Shortness of breath and altered mental status  EXAM: CT HEAD WITHOUT CONTRAST  TECHNIQUE: Contiguous axial images were obtained from the base of the skull through the vertex without intravenous contrast.  COMPARISON:  None currently available  FINDINGS:  Skull and Sinuses:Negative for fracture or destructive process. The mastoids, middle ears, and imaged paranasal sinuses are clear.  Orbits: Bilateral cataract resection.  Brain: No evidence of acute abnormality, such as acute infarction, hemorrhage, hydrocephalus, or mass lesion/mass effect. There is confluent cerebral white matter low density consistent with chronic small vessel disease. Chronic ischemic changes also affect the thalami. Cerebral volume loss which  is age appropriate.  IMPRESSION: 1. No acute intracranial abnormality. 2. Extensive chronic small vessel disease.   Electronically Signed   By: Jorje Guild M.D.   On: 07/17/2014 22:13    Scheduled Meds: . ALPRAZolam  0.5 mg Oral QHS  . amLODipine  5 mg Oral Daily  . aspirin EC  81 mg Oral Daily  . cefTRIAXone (ROCEPHIN)  IV  1 g Intravenous Q24H  . cycloSPORINE  1 drop Both Eyes QHS  . dorzolamide-timolol  1 drop Both Eyes BID  . enoxaparin (LOVENOX) injection  40 mg Subcutaneous Q24H  . ezetimibe  10 mg Oral Daily  . gabapentin  600 mg Oral QID  . insulin aspart  0-9 Units Subcutaneous TID WC  . levothyroxine  50 mcg Oral QAC breakfast  . lisinopril  10 mg Oral Daily  . multivitamin with minerals  1 tablet Oral Daily  . omega-3 acid ethyl esters  1 g Oral BID  . sodium chloride  3 mL Intravenous Q12H  . sodium chloride  3 mL Intravenous Q12H  . vitamin E  400 Units Oral Daily   Continuous Infusions:  Antibiotics Given (last 72 hours)   Date/Time Action Medication Dose Rate   07/18/14 1533 Given  [no iv]   cefTRIAXone (ROCEPHIN) 1 g in dextrose 5 % 50 mL IVPB 1 g 100 mL/hr      Principal Problem:   Encephalopathy acute Active Problems:   Hypertension   UTI (lower urinary tract infection)   SOB (shortness of breath)   Hypoxia   Hyponatremia   Acute renal insufficiency   Confusion    Time spent: 23min    [facility] Pager [phone]. If 7PM-7AM, please contact night-coverage at  [email or web address], password [facility] 07/19/2014, 4:09 PM  LOS: 2 days

## 2014-07-19 NOTE — Progress Notes (Signed)
Physical Therapy Treatment Note  (See also full note of treatment session by Adriana Reams, SPT)  Merrilee Jansky Balance Assessment completed as follows:    07/19/14 1100  Berg Balance Test  Sit to Stand 3  Standing Unsupported 4  Sitting with Back Unsupported but Feet Supported on Floor or Stool 4  Stand to Sit 3  Transfers 3  Standing Unsupported with Eyes Closed 4  Standing Ubsupported with Feet Together 3  From Standing, Reach Forward with Outstretched Arm 3  From Standing Position, Pick up Object from Floor 3  From Standing Position, Turn to Look Behind Over each Shoulder 1  Turn 360 Degrees 1  Standing Unsupported, Alternately Place Feet on Step/Stool 0  Standing Unsupported, One Foot in Front 0  Standing on One Leg 0  Total Score 32   A score of less than 36 is indicative of significant fall risk in older adults.  Roney Marion, [place]  Acute Rehabilitation Services Pager [phone] Office [phone]
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
## 2014-07-19 NOTE — Clinical Documentation Improvement (Signed)
  H&P states "Acute renal insufficiency". In the Coding world this equates to "disorder of the kidney and ureter". Please clarify this term to better reflect the severity of illness and risk of mortality of this patient. Thank you.  Possible Clinical Conditions?  - Acute Renal Failure - Acute Kidney Injury - Acute Tubular Necrosis - Acute on Chronic Renal Failure - Chronic Renal Failure - Other Condition  Supporting Information: - BUN/Cr:  8/3 28/1.46,  8/4 26/1.15 - GFR: 8/3 32/37,  8/4 42/49 - Per H&P "received over a liter of ivf"   Thank You, Ezekiel Ina ,RN Clinical Documentation Specialist:  [place] Information Management

## 2014-07-20 LAB — BASIC METABOLIC PANEL
ANION GAP: 13 (ref 5–15)
BUN: 13 mg/dL (ref 6–23)
CO2: 25 mEq/L (ref 19–32)
Calcium: 9.7 mg/dL (ref 8.4–10.5)
Chloride: 99 mEq/L (ref 96–112)
Creatinine, Ser: 0.6 mg/dL (ref 0.50–1.10)
GFR, EST NON AFRICAN AMERICAN: 81 mL/min — AB (ref >90)
Glucose, Bld: 166 mg/dL — ABNORMAL HIGH (ref 70–99)
Potassium: 3.7 mEq/L (ref 3.7–5.3)
Sodium: 137 mEq/L (ref 137–147)

## 2014-07-20 LAB — URINE CULTURE: Colony Count: 30000

## 2014-07-20 LAB — GLUCOSE, CAPILLARY
Glucose-Capillary: 141 mg/dL — ABNORMAL HIGH (ref 70–99)
Glucose-Capillary: 212 mg/dL — ABNORMAL HIGH (ref 70–99)
Glucose-Capillary: 159 mg/dL — ABNORMAL HIGH (ref 70–99)
Glucose-Capillary: 320 mg/dL — ABNORMAL HIGH (ref 70–99)

## 2014-07-20 NOTE — Clinical Social Work Note (Addendum)
Patient's daughter informed CSW this morning that Melissa Mccullough from [facility] came to visit with patient on 8/5 and they can take patient at their [place] at discharge. CSW talked later today with Melissa Mccullough, Research officer, political party of [employer] re: patient. Per Melissa Mccullough, they can take patient at discharge. They will need order for PT and order for walker on the FL-2. They will also need PPD placed an read or chest X-ray that rules out TB prior to discharge. CSW talked with patient nurse regarding information needed from MD.  Clinicals forwarded to [facility] via [place].  Melissa Mccullough, MSW, LCSW [phone]  .

## 2014-07-20 NOTE — Progress Notes (Signed)
Telemetry box 15 removed and returned.  CCMD notified.

## 2014-07-20 NOTE — Care Management Note (Signed)
CARE MANAGEMENT NOTE 07/20/2014  Patient:  Melissa Mccullough, Melissa Mccullough   Account Number:  [id number]  Date Initiated:  07/20/2014  Documentation initiated by:  Denay Pleitez  Subjective/Objective Assessment:   CM following for progression and d/c planning     Action/Plan:   07/20/14 Plan for d/c to SNF, family also research ALF. CSW working with family   Anticipated DC Date:  07/21/2014   Anticipated DC Plan:  SKILLED NURSING FACILITY         Choice offered to / List presented to:             Status of service:  Completed, signed off Medicare Important Message given?  YES (If response is "NO", the following Medicare IM given date fields will be blank) Date Medicare IM given:  07/20/2014 Medicare IM given by:  Kunta Hilleary Date Additional Medicare IM given:   Additional Medicare IM given by:    Discharge Disposition:  [facility]  Per UR Regulation:    If discussed at Long Length of Stay Meetings, dates discussed:    Comments:

## 2014-07-20 NOTE — Progress Notes (Signed)
[facility] PROGRESS NOTE  Melissa Mccullough HYQ:[id number] DOB: [birth year] DOA: 07/17/2014 PCP: [name], MD  Brief Narrative: 80 yo female h/o recurrent uti, htn, hld lives alone brought in by r daughtheer for ams.  Pt saw PCP day of admission and given bactrim for another uti, but has not started it. She got more confused so family brought her in. Over the weekend, she was in her normal state of health with no issues. Family has noted some cognitive decline over 1 year  Assessment/Plan: 1. UTI, Escherichia coli -Urine Cx from 3-4days back with [facility], S to cephalosporins -FU Urine Cx still with Escherichia coli -Continue IV Ceftriaxone, pending sensitivities follow and change to PO at discharge -Family decided on ALF>> per social work  will be available in a.m. 2. MEtabolic encephalopathy -due to 1, improving -Family has noted a short term memory/cognitive and slow functional decline over the last year -Could have Mild Cognitive dysfunction vs Early Dementia -will check TSH/T4, b12 -clinically do not suspect pneumonia, no cough/dyspnea at this time, CXR unremarkable  3. DM 2 -hold metformin, SSI  4. HTN -continue lisinopril, HCTZ on hold  5. Leg swelling -noted by PCP, since HCTZ held -Status post IVF decrease on 8/4 6. AKI -Resolved with hydration, follow DVT proph: lovenox  Code Status: Full Code Family Communication: None at bedside Disposition Plan: To ALF when medically ready  Antibiotics:  Vanc/Cefepime 83/-8/4  Ceftriaxone 8/4  HPI/Subjective: Doing well today, no new complaints. Daughter at bedside  Objective: Filed Vitals:   07/20/14 1012  BP: 139/82  Pulse: 82  Temp: 97.9 F (36.6 C)  Resp: 17    Intake/Output Summary (Last 24 hours) at 07/20/14 1907 Last data filed at 07/20/14 0034  Gross per 24 hour  Intake    240 ml  Output      0 ml  Net    240 ml   Filed Weights   07/18/14 2052 07/19/14 2056 07/20/14 0500  Weight:  72.902 kg (160 lb 11.5 oz) 72.902 kg (160 lb 11.5 oz) 72.9 kg (160 lb 11.5 oz)    Exam:   General:  AAOx2, disoriented to time  Cardiovascular: S1S2/RRR  Respiratory: CTAB  Abdomen: soft, NT, BS present  Musculoskeletal: Decreased edema bilaterally   Data Reviewed: Basic Metabolic Panel:  Recent Labs Lab 07/17/14 1744 07/18/14 0332 07/19/14 0520 07/20/14 0540  NA 128* 132* 139 137  K 5.2 4.7 4.9 3.7  CL 90* 98 101 99  CO2 26 23 27 25   GLUCOSE 111* 124* 110* 166*  BUN 28* 26* 20 13  CREATININE 1.46* 1.15* 0.83 0.60  CALCIUM 9.5 8.7 9.5 9.7   Liver Function Tests:  Recent Labs Lab 07/17/14 1744  AST 52*  ALT 27  ALKPHOS 93  BILITOT 0.5  PROT 7.7  ALBUMIN 3.7   No results found for this basename: LIPASE, AMYLASE,  in the last 168 hours No results found for this basename: AMMONIA,  in the last 168 hours CBC:  Recent Labs Lab 07/17/14 1744 07/18/14 0332 07/19/14 0520  WBC 11.6* 9.0 6.8  NEUTROABS 7.1  --   --   HGB 12.4 11.1* 12.2  HCT 37.8 34.5* 37.2  MCV 94.0 94.0 94.7  PLT 264 216 223   Cardiac Enzymes:  Recent Labs Lab 07/18/14 0332 07/18/14 0705  TROPONINI <0.30 <0.30   BNP (last 3 results)  Recent Labs  05/10/14 2003 07/17/14 1744  PROBNP 683.2* 225.4   CBG:  Recent Labs Lab 07/17/14 1706  07/17/14 1747 07/20/14 0750 07/20/14 1128 07/20/14 1626  GLUCAP 114* 118* 141* 212* 159*    Recent Results (from the past 240 hour(s))  URINE CULTURE     Status: None   Collection Time    07/13/14 10:57 AM      Result Value Ref Range Status   Culture ESCHERICHIA COLI   Final   Colony Count >=100,000 COLONIES/ML   Final   Organism ID, Bacteria ESCHERICHIA COLI   Final  URINE CULTURE     Status: None   Collection Time    07/17/14  9:43 PM      Result Value Ref Range Status   Specimen Description URINE, CATHETERIZED   Final   Special Requests NONE   Final   Culture  Setup Time     Final   Value: 07/18/2014 04:08     Performed at  [place]     Final   Value: >=100,000 COLONIES/ML     Performed at [facility]   Culture     Final   Value: ESCHERICHIA COLI     Performed at [facility]   Report Status 07/20/2014 FINAL   Final   Organism ID, Bacteria ESCHERICHIA COLI   Final  URINE CULTURE     Status: None   Collection Time    07/18/14  8:23 AM      Result Value Ref Range Status   Specimen Description URINE, CLEAN CATCH   Final   Special Requests NONE   Final   Culture  Setup Time     Final   Value: 07/18/2014 16:05     Performed at [place]     Final   Value: 30,000 COLONIES/ML     Performed at [facility]   Culture     Final   Value: ESCHERICHIA COLI     Performed at [facility]   Report Status 07/20/2014 FINAL   Final   Organism ID, Bacteria ESCHERICHIA COLI   Final     Studies: No results found.  Scheduled Meds: . ALPRAZolam  0.5 mg Oral QHS  . amLODipine  5 mg Oral Daily  . aspirin EC  81 mg Oral Daily  . cefTRIAXone (ROCEPHIN)  IV  1 g Intravenous Q24H  . cycloSPORINE  1 drop Both Eyes QHS  . dorzolamide-timolol  1 drop Both Eyes BID  . enoxaparin (LOVENOX) injection  40 mg Subcutaneous Q24H  . ezetimibe  10 mg Oral Daily  . gabapentin  600 mg Oral QID  . insulin aspart  0-9 Units Subcutaneous TID WC  . levothyroxine  50 mcg Oral QAC breakfast  . lisinopril  10 mg Oral Daily  . multivitamin with minerals  1 tablet Oral Daily  . omega-3 acid ethyl esters  1 g Oral BID  . sodium chloride  3 mL Intravenous Q12H  . sodium chloride  3 mL Intravenous Q12H  . vitamin E  400 Units Oral Daily   Continuous Infusions:  Antibiotics Given (last 72 hours)   Date/Time Action Medication Dose Rate   07/18/14 1533 Given  [no iv]   cefTRIAXone (ROCEPHIN) 1 g in dextrose 5 % 50 mL IVPB 1 g 100 mL/hr   07/19/14 1744 Given   cefTRIAXone (ROCEPHIN) 1 g in dextrose 5 % 50 mL IVPB 1 g 100 mL/hr   07/20/14 1223 Given    cefTRIAXone (ROCEPHIN) 1 g in dextrose 5 %  50 mL IVPB 1 g 100 mL/hr      Principal Problem:   Encephalopathy acute Active Problems:   Hypertension   UTI (lower urinary tract infection)   SOB (shortness of breath)   Hypoxia   Hyponatremia   Acute renal insufficiency   Confusion    Time spent: 27min    [facility] Pager [phone]. If 7PM-7AM, please contact night-coverage at [email or web address], password [facility] 07/20/2014, 7:07 PM  LOS: 3 days

## 2014-07-21 LAB — GLUCOSE, CAPILLARY
Glucose-Capillary: 187 mg/dL — ABNORMAL HIGH (ref 70–99)
Glucose-Capillary: 185 mg/dL — ABNORMAL HIGH (ref 70–99)

## 2014-07-21 MED ORDER — CEFUROXIME AXETIL 500 MG PO TABS: 500.0000 mg | ORAL_TABLET | Freq: Two times a day (BID) | ORAL | Status: DC

## 2014-07-21 NOTE — Progress Notes (Signed)
Inpatient Diabetes Program Recommendations  AACE/ADA: New Consensus Statement on Inpatient Glycemic Control (2013)  Target Ranges:  Prepandial:   less than 140 mg/dL      Peak postprandial:   less than 180 mg/dL (1-2 hours)      Critically ill patients:  140 - 180 mg/dL   Reason for Assessment: Results for Melissa Mccullough, HOLLOMAN (MRN [id number]) as of 07/21/2014 11:53  Ref. Range 07/20/2014 11:28 07/20/2014 16:26 07/20/2014 20:15 07/21/2014 07:38 07/21/2014 11:49  Glucose-Capillary Latest Range: 70-99 mg/dL 212 (H) 159 (H) 320 (H) 187 (H) 185 (H)    Diabetes history: Type 2 diabetes Outpatient Diabetes medications: Metformin 500 mg tid with meals Current orders for Inpatient glycemic control:  Note that CBG's are elevated while patient is in the hospital.  Consider adding basal insulin such as Levemir 10 units daily.  Will follow. Thanks, Adah Perl, RN, BC-ADM Inpatient Diabetes Coordinator Pager [phone]

## 2014-07-21 NOTE — Progress Notes (Signed)
Physical Therapy Treatment Patient Details Name: Melissa Mccullough MRN: [id number] DOB: 02-20-1928 Today's Date: 07/21/2014    History of Present Illness Pt admitted with hyperglycemia, confusion, UTI and CAP    PT Comments    Pt. Is making good progress with her functional mobility and gait.  Expect her to progress well at SNF .   Follow Up Recommendations  SNF;Supervision/Assistance - 24 hour     Equipment Recommendations  Rolling walker with 5" wheels;3in1 (PT)    Recommendations for Other Services       Precautions / Restrictions Precautions Precautions: Fall Restrictions Weight Bearing Restrictions: No    Mobility  Bed Mobility Overal bed mobility:  (not tested, pt. presents in recliner chair)                Transfers Overall transfer level: Needs assistance Equipment used: Rolling walker (2 wheeled) Transfers: Sit to/from Stand Sit to Stand: Min guard         General transfer comment: min guard assist level to assure safety in sit<>stand, vcs for hand placement  Ambulation/Gait Ambulation/Gait assistance: Modified independent (Device/Increase time) Ambulation Distance (Feet): 100 Feet Assistive device: Rolling walker (2 wheeled) Gait Pattern/deviations: Step-through pattern Gait velocity: decreased   General Gait Details: pt. with less veering today, able to self manage RW in hallway however fatigued relatively quickly   Stairs            Wheelchair Mobility    Modified Rankin (Stroke Patients Only)       Balance                                    Cognition Arousal/Alertness: Awake/alert Behavior During Therapy: WFL for tasks assessed/performed Overall Cognitive Status: Within Functional Limits for tasks assessed                      Exercises General Exercises - Lower Extremity Ankle Circles/Pumps: AROM;Both;10 reps Long Arc Quad: AROM;Both;10 reps;Seated Hip Flexion/Marching: AROM;Both;10  reps;Seated Toe Raises: AROM;Both;10 reps;Seated    General Comments        Pertinent Vitals/Pain Pain Assessment: No/denies pain    Home Living                      Prior Function            PT Goals (current goals can now be found in the care plan section) Progress towards PT goals: Progressing toward goals    Frequency  Min 3X/week    PT Plan Current plan remains appropriate    Co-evaluation             End of Session Equipment Utilized During Treatment: Gait belt Activity Tolerance: Patient tolerated treatment well Patient left: in chair;with call bell/phone within reach;with chair alarm set;with family/visitor present     Time: 7353-2992 PT Time Calculation (min): 23 min  Charges:  $Gait Training: 8-22 mins $Therapeutic Exercise: 8-22 mins                    G Codes:      Melissa Mccullough 07/21/2014, 2:24 PM Melissa Mccullough PT Acute Rehab Services [phone] [place] [phone]

## 2014-07-21 NOTE — Discharge Summary (Signed)
Physician Discharge Summary  Melissa Mccullough DVV:[id number] DOB: August 27, 1928 DOA: 07/17/2014  PCP: [name], MD  Admit date: 07/17/2014 Discharge date: 07/21/2014  Time spent: >30 minutes  Recommendations for Outpatient Follow-up:  Follow-up Information   Please follow up. (SNF MD in 1-2days)        Discharge Diagnoses:  Principal Problem:   Encephalopathy acute Active Problems:   Hypertension   UTI (lower urinary tract infection)   SOB (shortness of breath)   Hypoxia   Hyponatremia   Acute renal insufficiency   Confusion   Discharge Condition: Improved/stable  Diet recommendation: Modified carbohydrate  Filed Weights   07/19/14 2056 07/20/14 0500 07/21/14 0547  Weight: 72.902 kg (160 lb 11.5 oz) 72.9 kg (160 lb 11.5 oz) 68.856 kg (151 lb 12.8 oz)    History of present illness:    Hospital Course:  1.UTI, Escherichia coli -As discussed above upon admission it was noted that patient's Urine Cx from 3-4days back showed EColi, sensitive to cephalosporins  -She had a repeat urine culture done on admission and was started on empiric antibiotics -Urine culture from admission came back with Escherichia coli as well sensitive to cephalosporins>> patient's antibiotics were narrowed to Rocephin -She improved clinically, she's been alert and mentating at baseline, and has been afebrile with no leukocytosis. -She'll be discharged on oral antibiotics and is to follow up outpatient. -She was evaluated by physical therapy and SNF versus home health with supervision recommended >>Family followup final decision is to go to SNF>> she is medically ready for discharge and is to follow up outpatient. 2. MEtabolic encephalopathy  -due to 1, improving  -Family has noted a short term memory/cognitive and slow functional decline over the last year  -Could have Mild Cognitive dysfunction vs Early Dementia  -TSH/T4, b12 were done and came back within normal limits -There was concern  about a possible pneumonia initially but followup chest x-ray still showed no acute infiltrates CXR, the impression was that her symptoms were secondary to #1. 3. DM 2  -Her metformin was held in the hospital and she was covered with sliding scale insulin. She is to continue her metformin upon discharge 4. HTN  -continue lisinopril, her renal function is within normal limits at this time she is also to resume her HCTZ upon discharge  -It is noted she has a history of peripheral edema, she continue her HCTZ as her dimension and followup with PCP 5. AKI  -Resolved with hydration, follow     Procedures:  None  Consultations:  None  Discharge Exam: Filed Vitals:   07/21/14 0918  BP: 143/75  Pulse: 78  Temp: 98.9 F (37.2 C)  Resp: 18   Exam:  General: AAOx2, disoriented to time  Cardiovascular: S1S2/RRR  Respiratory: CTAB  Abdomen: soft, NT, BS present  Musculoskeletal: No edema no cyanosis     Discharge Instructions You were cared for by a hospitalist during your hospital stay. If you have any questions about your discharge medications or the care you received while you were in the hospital after you are discharged, you can call the unit and asked to speak with the hospitalist on call if the hospitalist that took care of you is not available. Once you are discharged, your primary care physician will handle any further medical issues. Please note that NO REFILLS for any discharge medications will be authorized once you are discharged, as it is imperative that you return to your primary care physician (or establish a relationship with a  primary care physician if you do not have one) for your aftercare needs so that they can reassess your need for medications and monitor your lab values.  Discharge Instructions   Diet Carb Modified    Complete by:  As directed      Increase activity slowly    Complete by:  As directed             Medication List    STOP taking these  medications       amoxicillin 250 MG capsule  Commonly known as:  AMOXIL     sulfamethoxazole-trimethoprim 800-160 MG per tablet  Commonly known as:  BACTRIM DS      TAKE these medications       ALPRAZolam 0.5 MG tablet  Commonly known as:  XANAX  Take 0.5 mg by mouth at bedtime.     amLODipine 5 MG tablet  Commonly known as:  NORVASC  Take 5 mg by mouth daily.     aspirin EC 81 MG tablet  Take 81 mg by mouth daily.     bimatoprost 0.01 % Soln  Commonly known as:  LUMIGAN  Place 1 drop into both eyes at bedtime.     CALCIUM-MAGNESIUM-ZINC PO  Take 1 tablet by mouth daily.     cefUROXime 500 MG tablet  Commonly known as:  CEFTIN  Take 1 tablet (500 mg total) by mouth 2 (two) times daily with a meal.     cycloSPORINE 0.05 % ophthalmic emulsion  Commonly known as:  RESTASIS  Place 1 drop into both eyes at bedtime.     dorzolamide-timolol 22.3-6.8 MG/ML ophthalmic solution  Commonly known as:  COSOPT  Place 1 drop into both eyes 2 (two) times daily.     ezetimibe 10 MG tablet  Commonly known as:  ZETIA  Take 10 mg by mouth daily.     gabapentin 600 MG tablet  Commonly known as:  NEURONTIN  Take 600 mg by mouth 4 (four) times daily. for neuritis pain     GENTEAL PM OP  Place 1 drop into both eyes at bedtime.     hydrochlorothiazide 25 MG tablet  Commonly known as:  HYDRODIURIL  Take 25 mg by mouth daily.     levothyroxine 50 MCG tablet  Commonly known as:  SYNTHROID, LEVOTHROID  Take 50 mcg by mouth daily before breakfast.     lisinopril 10 MG tablet  Commonly known as:  PRINIVIL,ZESTRIL  Take 1 tablet (10 mg total) by mouth daily.     meclizine 25 MG tablet  Commonly known as:  ANTIVERT  Take 12.5-25 mg by mouth 3 (three) times daily as needed for dizziness (vertigo).     metFORMIN 500 MG 24 hr tablet  Commonly known as:  GLUCOPHAGE-XR  Take 500 mg by mouth 3 (three) times daily after meals. Takes 2 tabs at lunch if glucose above 200      multivitamin with minerals Tabs tablet  Take 1 tablet by mouth daily.     omega-3 acid ethyl esters 1 G capsule  Commonly known as:  LOVAZA  Take 1 g by mouth 2 (two) times daily.     PERFECT IRON PO  Take 1 tablet by mouth daily.     traMADol 50 MG tablet  Commonly known as:  ULTRAM  Take 50 mg by mouth 3 (three) times daily as needed (pain).     vitamin C 1000 MG tablet  Take 1,000 mg by mouth 3 (three) times  daily. For urine infection     VITAMIN D PO  Take 1,000 Units by mouth daily.     vitamin E 400 UNIT capsule  Take 400 Units by mouth daily.       Allergies  Allergen Reactions  . Codeine Rash  . Macrobid [Nitrofurantoin Macrocrystal] Rash      The results of significant diagnostics from this hospitalization (including imaging, microbiology, ancillary and laboratory) are listed below for reference.    Significant Diagnostic Studies: Dg Chest 2 View  07/18/2014   CLINICAL DATA:  Cough  EXAM: CHEST  2 VIEW  COMPARISON:  July 17, 2014  FINDINGS: There is no edema or consolidation. Heart is mildly enlarged with pulmonary vascularity within normal limits. No adenopathy. There is atherosclerotic change in aorta. There are surgical clips in the left neck region, stable. There is evidence of calcific tendinosis in the right shoulder.  IMPRESSION: No edema or consolidation.  Stable cardiac prominence.   Electronically Signed   By: Lowella Grip M.D.   On: 07/18/2014 08:09   Dg Chest 2 View  07/17/2014   CLINICAL DATA:  Shortness of breath.  Altered mental status.  EXAM: CHEST  2 VIEW  COMPARISON:  05/10/2014.  FINDINGS: Stable enlarged cardiac silhouette. The pulmonary vasculature remains mildly prominent. Decreased prominence of the interstitial markings. Clear lungs. No pleural fluid. Thoracic spine degenerative changes. Diffuse osteopenia. Left lower neck surgical clips.  IMPRESSION: Stable cardiomegaly.  No acute abnormality.   Electronically Signed   By: Enrique Sack M.D.    On: 07/17/2014 18:48   Ct Head Wo Contrast  07/17/2014   CLINICAL DATA:  Shortness of breath and altered mental status  EXAM: CT HEAD WITHOUT CONTRAST  TECHNIQUE: Contiguous axial images were obtained from the base of the skull through the vertex without intravenous contrast.  COMPARISON:  None currently available  FINDINGS: Skull and Sinuses:Negative for fracture or destructive process. The mastoids, middle ears, and imaged paranasal sinuses are clear.  Orbits: Bilateral cataract resection.  Brain: No evidence of acute abnormality, such as acute infarction, hemorrhage, hydrocephalus, or mass lesion/mass effect. There is confluent cerebral white matter low density consistent with chronic small vessel disease. Chronic ischemic changes also affect the thalami. Cerebral volume loss which is age appropriate.  IMPRESSION: 1. No acute intracranial abnormality. 2. Extensive chronic small vessel disease.   Electronically Signed   By: Jorje Guild M.D.   On: 07/17/2014 22:13    Microbiology: Recent Results (from the past 240 hour(s))  URINE CULTURE     Status: None   Collection Time    07/13/14 10:57 AM      Result Value Ref Range Status   Culture ESCHERICHIA COLI   Final   Colony Count >=100,000 COLONIES/ML   Final   Organism ID, Bacteria ESCHERICHIA COLI   Final  URINE CULTURE     Status: None   Collection Time    07/17/14  9:43 PM      Result Value Ref Range Status   Specimen Description URINE, CATHETERIZED   Final   Special Requests NONE   Final   Culture  Setup Time     Final   Value: 07/18/2014 04:08     Performed at [place]     Final   Value: >=100,000 COLONIES/ML     Performed at [facility]   Culture     Final   Value: ESCHERICHIA COLI     Performed at  [facility]   Report Status 07/20/2014 FINAL   Final   Organism ID, Bacteria ESCHERICHIA COLI   Final  URINE CULTURE     Status: None   Collection Time    07/18/14  8:23 AM       Result Value Ref Range Status   Specimen Description URINE, CLEAN CATCH   Final   Special Requests NONE   Final   Culture  Setup Time     Final   Value: 07/18/2014 16:05     Performed at [place]     Final   Value: 30,000 COLONIES/ML     Performed at [facility]   Culture     Final   Value: ESCHERICHIA COLI     Performed at [facility]   Report Status 07/20/2014 FINAL   Final   Organism ID, Bacteria ESCHERICHIA COLI   Final     Labs: Basic Metabolic Panel:  Recent Labs Lab 07/17/14 1744 07/18/14 0332 07/19/14 0520 07/20/14 0540  NA 128* 132* 139 137  K 5.2 4.7 4.9 3.7  CL 90* 98 101 99  CO2 26 23 27 25   GLUCOSE 111* 124* 110* 166*  BUN 28* 26* 20 13  CREATININE 1.46* 1.15* 0.83 0.60  CALCIUM 9.5 8.7 9.5 9.7   Liver Function Tests:  Recent Labs Lab 07/17/14 1744  AST 52*  ALT 27  ALKPHOS 93  BILITOT 0.5  PROT 7.7  ALBUMIN 3.7   No results found for this basename: LIPASE, AMYLASE,  in the last 168 hours No results found for this basename: AMMONIA,  in the last 168 hours CBC:  Recent Labs Lab 07/17/14 1744 07/18/14 0332 07/19/14 0520  WBC 11.6* 9.0 6.8  NEUTROABS 7.1  --   --   HGB 12.4 11.1* 12.2  HCT 37.8 34.5* 37.2  MCV 94.0 94.0 94.7  PLT 264 216 223   Cardiac Enzymes:  Recent Labs Lab 07/18/14 0332 07/18/14 0705  TROPONINI <0.30 <0.30   BNP: BNP (last 3 results)  Recent Labs  05/10/14 2003 07/17/14 1744  PROBNP 683.2* 225.4   CBG:  Recent Labs Lab 07/20/14 1128 07/20/14 1626 07/20/14 2015 07/21/14 0738 07/21/14 1149  GLUCAP 212* 159* 320* 187* 185*       Signed:  Trinda Harlacher C  [facility] 07/21/2014, 2:41 PM

## 2014-07-21 NOTE — Progress Notes (Signed)
Pt d/c to home. Discharge paperwork, reasons to return to ED/MD, prescriptions, and follow up appts reviewed with pt. Pt offered no questions. PIV removed. Discharge paperwork signed by pt and RN. Pt escorted off of unit to main lobby via wheelchair.

## 2014-07-24 ENCOUNTER — Other Ambulatory Visit: Payer: Self-pay | Admitting: *Deleted

## 2014-07-24 MED ORDER — TRAMADOL HCL 50 MG PO TABS: ORAL_TABLET | ORAL | Status: DC

## 2014-07-24 MED ORDER — ALPRAZOLAM 0.5 MG PO TABS: ORAL_TABLET | ORAL | Status: DC

## 2014-07-24 NOTE — Telephone Encounter (Signed)
[facility] 

## 2014-07-25 ENCOUNTER — Non-Acute Institutional Stay (SKILLED_NURSING_FACILITY): Payer: Medicare Other | Admitting: Internal Medicine

## 2014-07-25 DIAGNOSIS — R197 Diarrhea, unspecified: Secondary | ICD-10-CM

## 2014-07-25 DIAGNOSIS — N390 Urinary tract infection, site not specified: Secondary | ICD-10-CM

## 2014-07-25 DIAGNOSIS — E039 Hypothyroidism, unspecified: Secondary | ICD-10-CM

## 2014-07-25 DIAGNOSIS — E1129 Type 2 diabetes mellitus with other diabetic kidney complication: Secondary | ICD-10-CM

## 2014-07-28 NOTE — Progress Notes (Signed)
HISTORY & PHYSICAL  DATE: 07/25/2014   FACILITY: [facility]  LEVEL OF CARE: SNF (31)  ALLERGIES:  Allergies  Allergen Reactions  . Codeine Rash  . Macrobid [Nitrofurantoin Macrocrystal] Rash    CHIEF COMPLAINT:  Manage UTI, diabetes mellitus and hypothyroidism  HISTORY OF PRESENT ILLNESS: Patient is an 80 year old Caucasian female who was hospitalized secondary to acute encephalopathy. After hospitalization patient is admitted to this facility for short-term rehabilitation.  UTI: The UTI remains stable.  The patient denies ongoing suprapubic pain, flank pain, dysuria, urinary frequency, urinary hesitancy or hematuria.  No complications reported from the current antibiotic being used. Patient is currently on ceftin.  DM:pt's DM remains stable.  Pt denies polyuria, polydipsia, polyphagia, changes in vision or hypoglycemic episodes.  No complications noted from the medication presently being used.  Last hemoglobin A1c is: Not available.  HYPOTHYROIDISM: The hypothyroidism remains stable. No complications noted from the medications presently being used.  The patient denies fatigue or constipation.  Last TSH not available.  PAST MEDICAL HISTORY :  Past Medical History  Diagnosis Date  . Type II or unspecified type diabetes mellitus without mention of complication, not stated as uncontrolled   . Hypertension   . Hyperlipidemia   . Neuropathy   . Hypothyroidism   . Anemia   . Glaucoma   . Hyperglycemia 04/2014    PAST SURGICAL HISTORY: Past Surgical History  Procedure Laterality Date  . Breast surgery    . Cholecystectomy    . Thyroidectomy      SOCIAL HISTORY:  reports that she has quit smoking. She has never used smokeless tobacco. She reports that she does not drink alcohol or use illicit drugs.  FAMILY HISTORY:  Family History  Problem Relation Age of Onset  . Heart disease Mother   . Cancer Other   . Heart attack Other     CURRENT  MEDICATIONS: Reviewed per MAR/see medication list  REVIEW OF SYSTEMS: GI: Complains of diarrhea,  See HPI otherwise 14 point ROS is negative.  PHYSICAL EXAMINATION  VS:  See VS section  GENERAL: no acute distress, moderately obese body habitus EYES: conjunctivae normal, sclerae normal, normal eye lids MOUTH/THROAT: lips without lesions,no lesions in the mouth,tongue is without lesions,uvula elevates in midline NECK: supple, trachea midline, no neck masses, no thyroid tenderness, no thyromegaly LYMPHATICS: no LAN in the neck, no supraclavicular LAN RESPIRATORY: breathing is even & unlabored, BS CTAB CARDIAC: RRR, no murmur,no extra heart sounds, no edema GI:  ABDOMEN: abdomen soft, normal BS, no masses, no tenderness  LIVER/SPLEEN: no hepatomegaly, no splenomegaly MUSCULOSKELETAL: HEAD: normal to inspection  EXTREMITIES: LEFT UPPER EXTREMITY: full range of motion, normal strength & tone RIGHT UPPER EXTREMITY:  full range of motion, normal strength & tone LEFT LOWER EXTREMITY:  Moderate range of motion, normal strength & tone RIGHT LOWER EXTREMITY: Moderate range of motion, normal strength & tone PSYCHIATRIC: the patient is alert & oriented to person, affect & behavior appropriate  LABS/RADIOLOGY:  Labs reviewed: Basic Metabolic Panel:  Recent Labs  11/23/13 1457 02/28/14 1111  07/18/14 0332 07/19/14 0520 07/20/14 0540  NA 137 136  < > 132* 139 137  K 4.8 4.8  < > 4.7 4.9 3.7  CL 100 98  < > 98 101 99  CO2 29 30  < > 23 27 25   GLUCOSE 134* 190*  < > 124* 110* 166*  BUN 16 17  < > 26* 20 13  CREATININE 0.69 0.80  < > 1.15* 0.83 0.60  CALCIUM 10.2 10.1  < > 8.7 9.5 9.7  MG 1.7 1.8  --   --   --   --   < > = values in this interval not displayed. Liver Function Tests:  Recent Labs  02/28/14 1111 05/10/14 1755 07/17/14 1744  AST 32 36 52*  ALT 22 24 27   ALKPHOS 64 76 93  BILITOT 0.6 0.6 0.5  PROT 7.5 7.3 7.7  ALBUMIN 4.1 3.4* 3.7   CBC:  Recent Labs   05/11/14 0443  05/17/14 1241 07/17/14 1744 07/18/14 0332 07/19/14 0520  WBC 20.7*  < > 10.2 11.6* 9.0 6.8  NEUTROABS 15.8*  --  6.0 7.1  --   --   HGB 10.0*  < > 13.2 12.4 11.1* 12.2  HCT 29.8*  < > 38.3 37.8 34.5* 37.2  MCV 94.9  < > 90.3 94.0 94.0 94.7  PLT 198  < > 274 264 216 223  < > = values in this interval not displayed.  Lipid Panel:  Recent Labs  11/23/13 1457 02/28/14 1111  HDL 47 49   Cardiac Enzymes:  Recent Labs  07/18/14 0332 07/18/14 0705  TROPONINI <0.30 <0.30   CBG:  Recent Labs  07/20/14 2015 07/21/14 0738 07/21/14 1149  GLUCAP 320* 187* 185*    CT HEAD WITHOUT CONTRAST   TECHNIQUE: Contiguous axial images were obtained from the base of the skull through the vertex without intravenous contrast.   COMPARISON:  None currently available   FINDINGS: Skull and Sinuses:Negative for fracture or destructive process. The mastoids, middle ears, and imaged paranasal sinuses are clear.   Orbits: Bilateral cataract resection.   Brain: No evidence of acute abnormality, such as acute infarction, hemorrhage, hydrocephalus, or mass lesion/mass effect. There is confluent cerebral white matter low density consistent with chronic small vessel disease. Chronic ischemic changes also affect the thalami. Cerebral volume loss which is age appropriate.   IMPRESSION: 1. No acute intracranial abnormality. 2. Extensive chronic small vessel disease.     CHEST  2 VIEW   COMPARISON:  July 17, 2014   FINDINGS: There is no edema or consolidation. Heart is mildly enlarged with pulmonary vascularity within normal limits. No adenopathy. There is atherosclerotic change in aorta. There are surgical clips in the left neck region, stable. There is evidence of calcific tendinosis in the right shoulder.   IMPRESSION: No edema or consolidation.  Stable cardiac prominence.    ASSESSMENT/PLAN:  UTI-continue Ceftin as prescribed Diabetes mellitus-continue  metformin Hypothyroidism-continue levothyroxine Diarrhea-new problem. Check C. difficile toxin x3. Neuropathy-continue Neurontin Hypertension-blood pressure elevated. We'll monitor.  I have reviewed patient's medical records received at admission/from hospitalization.  CPT CODE: 60109  Cathey Fredenburg Y Laurabeth Yip, Jette [phone]

## 2014-08-03 ENCOUNTER — Non-Acute Institutional Stay (SKILLED_NURSING_FACILITY): Payer: Medicare Other | Admitting: Adult Health

## 2014-08-03 ENCOUNTER — Encounter: Payer: Self-pay | Admitting: Adult Health

## 2014-08-03 DIAGNOSIS — G629 Polyneuropathy, unspecified: Secondary | ICD-10-CM

## 2014-08-03 DIAGNOSIS — F419 Anxiety disorder, unspecified: Secondary | ICD-10-CM

## 2014-08-03 DIAGNOSIS — G589 Mononeuropathy, unspecified: Secondary | ICD-10-CM

## 2014-08-03 DIAGNOSIS — E785 Hyperlipidemia, unspecified: Secondary | ICD-10-CM

## 2014-08-03 DIAGNOSIS — F411 Generalized anxiety disorder: Secondary | ICD-10-CM

## 2014-08-03 DIAGNOSIS — E039 Hypothyroidism, unspecified: Secondary | ICD-10-CM

## 2014-08-03 DIAGNOSIS — E1129 Type 2 diabetes mellitus with other diabetic kidney complication: Secondary | ICD-10-CM

## 2014-08-03 DIAGNOSIS — I10 Essential (primary) hypertension: Secondary | ICD-10-CM

## 2014-08-03 NOTE — Progress Notes (Signed)
Patient ID: Melissa Mccullough, female   DOB: [birth year], 80 y.o.   MRN: [id number]              PROGRESS NOTE  DATE: 08/03/2014   FACILITY: [facility]  LEVEL OF CARE: SNF (31)  Acute Visit  CHIEF COMPLAINT:  Discharge Notes  HISTORY OF PRESENT ILLNESS: This is an 80 year old female who is for discharge home to an [place] on 07/21/14 from [facility] with Acute Encephalopathy. She has been admitted to [facility] onPatient was admitted to this facility for short-term rehabilitation after the patient's recent hospitalization.  Patient has completed SNF rehabilitation and therapy has cleared the patient for discharge.  Reassessment of ongoing problem(s):  HTN: Pt 's HTN remains stable.  Denies CP, sob, DOE, pedal edema, headaches, dizziness or visual disturbances.  No complications from the medications currently being used.  Last BP : 139/76  HYPERLIPIDEMIA: No complications from the medications presently being used. 3/15 fasting lipid panel showed : 81 triglycerides 169 LDL 98 HDL 49  PERIPHERAL NEUROPATHY: The peripheral neuropathy is stable. The patient denies pain in the feet, tingling, and numbness. No complications noted from the medication presently being used.   PAST MEDICAL HISTORY : Reviewed.  No changes/see problem list  CURRENT MEDICATIONS: Reviewed per MAR/see medication list  REVIEW OF SYSTEMS:  GENERAL: no change in appetite, no fatigue, no weight changes, no fever, chills or weakness RESPIRATORY: no cough, SOB, DOE, wheezing, hemoptysis CARDIAC: no chest pain, edema or palpitations GI: no abdominal pain, constipation, heart burn, nausea or vomiting  PHYSICAL EXAMINATION  GENERAL: no acute distress, normal body habitus EYES: conjunctivae normal, sclerae normal, normal eye lids NECK: supple, trachea midline, no neck masses, no thyroid tenderness, no thyromegaly LYMPHATICS: no LAN in the neck, no supraclavicular  LAN RESPIRATORY: breathing is even & unlabored, BS CTAB CARDIAC: RRR, no murmur,no extra heart sounds, no edema GI: abdomen soft, normal BS, no masses, no tenderness, no hepatomegaly, no splenomegaly EXTREMITIES:  Able to move all 4 extremities PSYCHIATRIC: the patient is alert & oriented to person, affect & behavior appropriate  LABS/RADIOLOGY: Labs reviewed: Basic Metabolic Panel:  Recent Labs  11/23/13 1457 02/28/14 1111  07/18/14 0332 07/19/14 0520 07/20/14 0540  NA 137 136  < > 132* 139 137  K 4.8 4.8  < > 4.7 4.9 3.7  CL 100 98  < > 98 101 99  CO2 29 30  < > 23 27 25   GLUCOSE 134* 190*  < > 124* 110* 166*  BUN 16 17  < > 26* 20 13  CREATININE 0.69 0.80  < > 1.15* 0.83 0.60  CALCIUM 10.2 10.1  < > 8.7 9.5 9.7  MG 1.7 1.8  --   --   --   --   < > = values in this interval not displayed. Liver Function Tests:  Recent Labs  02/28/14 1111 05/10/14 1755 07/17/14 1744  AST 32 36 52*  ALT 22 24 27   ALKPHOS 64 76 93  BILITOT 0.6 0.6 0.5  PROT 7.5 7.3 7.7  ALBUMIN 4.1 3.4* 3.7   CBC:  Recent Labs  05/11/14 0443  05/17/14 1241 07/17/14 1744 07/18/14 0332 07/19/14 0520  WBC 20.7*  < > 10.2 11.6* 9.0 6.8  NEUTROABS 15.8*  --  6.0 7.1  --   --   HGB 10.0*  < > 13.2 12.4 11.1* 12.2  HCT 29.8*  < > 38.3 37.8 34.5* 37.2  MCV  94.9  < > 90.3 94.0 94.0 94.7  PLT 198  < > 274 264 216 223  < > = values in this interval not displayed.  Lipid Panel:  Recent Labs  11/23/13 1457 02/28/14 1111  HDL 47 49   Cardiac Enzymes:  Recent Labs  07/18/14 0332 07/18/14 0705  TROPONINI <0.30 <0.30   CBG:  Recent Labs  07/20/14 2015 07/21/14 0738 07/21/14 1149  GLUCAP 320* 187* 185*    EXAM: CT HEAD WITHOUT CONTRAST   TECHNIQUE: Contiguous axial images were obtained from the base of the skull through the vertex without intravenous contrast.   COMPARISON:  None currently available   FINDINGS: Skull and Sinuses:Negative for fracture or destructive process.  The mastoids, middle ears, and imaged paranasal sinuses are clear.   Orbits: Bilateral cataract resection.   Brain: No evidence of acute abnormality, such as acute infarction, hemorrhage, hydrocephalus, or mass lesion/mass effect. There is confluent cerebral white matter low density consistent with chronic small vessel disease. Chronic ischemic changes also affect the thalami. Cerebral volume loss which is age appropriate.   IMPRESSION: 1. No acute intracranial abnormality. 2. Extensive chronic small vessel disease.   EXAM: CHEST  2 VIEW   COMPARISON:  July 17, 2014   FINDINGS: There is no edema or consolidation. Heart is mildly enlarged with pulmonary vascularity within normal limits. No adenopathy. There is atherosclerotic change in aorta. There are surgical clips in the left neck region, stable. There is evidence of calcific tendinosis in the right shoulder.   IMPRESSION: No edema or consolidation.  Stable cardiac prominence   ASSESSMENT/PLAN:  Hypertension - well controlled; continue lisinopril, HCTZ and Norvasc Diabetes mellitus, type II - well controlled; continue metformin Anxiety - stable; continue Xanax Hyperlipidemia - continue Zetia and Lovaza Neuropathy - stable; continue Neurontin Hypothyroidism - continue Synthroid  I have filled out patient's discharge paperwork and written prescriptions.    Total discharge time: Less than 30 minutes  Discharge time involved coordination of the discharge process with Education officer, museum, nursing staff and therapy department.verified.  CPT CODE: 09381  Seth Bake - NP [facility] [phone]

## 2014-08-17 ENCOUNTER — Ambulatory Visit: Payer: Self-pay

## 2014-09-01 ENCOUNTER — Emergency Department (HOSPITAL_COMMUNITY)
Admission: EM | Admit: 2014-09-01 | Discharge: 2014-09-01 | Disposition: A | Discharge status: Home / Self Care | Payer: Medicare Other | Attending: Emergency Medicine | Admitting: Emergency Medicine

## 2014-09-01 ENCOUNTER — Encounter (HOSPITAL_COMMUNITY): Payer: Self-pay | Admitting: Emergency Medicine

## 2014-09-01 DIAGNOSIS — Z8669 Personal history of other diseases of the nervous system and sense organs: Secondary | ICD-10-CM | POA: Diagnosis not present

## 2014-09-01 DIAGNOSIS — Y9389 Activity, other specified: Secondary | ICD-10-CM | POA: Insufficient documentation

## 2014-09-01 DIAGNOSIS — S0083XA Contusion of other part of head, initial encounter: Secondary | ICD-10-CM | POA: Diagnosis not present

## 2014-09-01 DIAGNOSIS — H409 Unspecified glaucoma: Secondary | ICD-10-CM | POA: Diagnosis not present

## 2014-09-01 DIAGNOSIS — Z862 Personal history of diseases of the blood and blood-forming organs and certain disorders involving the immune mechanism: Secondary | ICD-10-CM | POA: Diagnosis not present

## 2014-09-01 DIAGNOSIS — I10 Essential (primary) hypertension: Secondary | ICD-10-CM | POA: Diagnosis not present

## 2014-09-01 DIAGNOSIS — E119 Type 2 diabetes mellitus without complications: Secondary | ICD-10-CM | POA: Diagnosis not present

## 2014-09-01 DIAGNOSIS — W010XXA Fall on same level from slipping, tripping and stumbling without subsequent striking against object, initial encounter: Secondary | ICD-10-CM | POA: Diagnosis not present

## 2014-09-01 DIAGNOSIS — S199XXA Unspecified injury of neck, initial encounter: Secondary | ICD-10-CM

## 2014-09-01 DIAGNOSIS — Z79899 Other long term (current) drug therapy: Secondary | ICD-10-CM | POA: Diagnosis not present

## 2014-09-01 DIAGNOSIS — S0993XA Unspecified injury of face, initial encounter: Secondary | ICD-10-CM | POA: Insufficient documentation

## 2014-09-01 DIAGNOSIS — Y929 Unspecified place or not applicable: Secondary | ICD-10-CM | POA: Insufficient documentation

## 2014-09-01 DIAGNOSIS — Z7982 Long term (current) use of aspirin: Secondary | ICD-10-CM | POA: Insufficient documentation

## 2014-09-01 DIAGNOSIS — S0003XA Contusion of scalp, initial encounter: Secondary | ICD-10-CM | POA: Diagnosis not present

## 2014-09-01 DIAGNOSIS — S1093XA Contusion of unspecified part of neck, initial encounter: Principal | ICD-10-CM

## 2014-09-01 NOTE — ED Notes (Signed)
Bed: WA25 Expected date:  Expected time:  Means of arrival:  Comments: EMS fall 

## 2014-09-01 NOTE — ED Notes (Signed)
Pt unable to sign for discharge s/t dementia.

## 2014-09-01 NOTE — ED Notes (Signed)
Bed: [facility] Expected date:  Expected time:  Means of arrival:  Comments: EMS-diarrhea

## 2014-09-01 NOTE — Discharge Instructions (Signed)
Fall precautions. Return to ER if worse, new symptoms, pain, severe headache, vomiting, other concern.    Facial or Scalp Contusion A facial or scalp contusion is a deep bruise on the face or head. Injuries to the face and head generally cause a lot of swelling, especially around the eyes. Contusions are the result of an injury that caused bleeding under the skin. The contusion may turn blue, purple, or yellow. Minor injuries will give you a painless contusion, but more severe contusions may stay painful and swollen for a few weeks.  CAUSES  A facial or scalp contusion is caused by a blunt injury or trauma to the face or head area.  SIGNS AND SYMPTOMS   Swelling of the injured area.   Discoloration of the injured area.   Tenderness, soreness, or pain in the injured area.  DIAGNOSIS  The diagnosis can be made by taking a medical history and doing a physical exam. An X-ray exam, CT scan, or MRI may be needed to determine if there are any associated injuries, such as broken bones (fractures). TREATMENT  Often, the best treatment for a facial or scalp contusion is applying cold compresses to the injured area. Over-the-counter medicines may also be recommended for pain control.  HOME CARE INSTRUCTIONS   Only take over-the-counter or prescription medicines as directed by your health care provider.   Apply ice to the injured area.   Put ice in a plastic bag.   Place a towel between your skin and the bag.   Leave the ice on for 20 minutes, 2-3 times a day.  SEEK MEDICAL CARE IF:  You have bite problems.   You have pain with chewing.   You are concerned about facial defects. SEEK IMMEDIATE MEDICAL CARE IF:  You have severe pain or a headache that is not relieved by medicine.   You have unusual sleepiness, confusion, or personality changes.   You throw up (vomit).   You have a persistent nosebleed.   You have double vision or blurred vision.   You have fluid  drainage from your nose or ear.   You have difficulty walking or using your arms or legs.  MAKE SURE YOU:   Understand these instructions.  Will watch your condition.  Will get help right away if you are not doing well or get worse. Document Released: 01/08/2005 Document Revised: 09/21/2013 Document Reviewed: 07/14/2013 [facility] Patient Information 2015 [place], [place]. This information is not intended to replace advice given to you by your health care provider. Make sure you discuss any questions you have with your health care provider.    Head Injury You have received a head injury. It does not appear serious at this time. Headaches and vomiting are common following head injury. It should be easy to awaken from sleeping. Sometimes it is necessary for you to stay in the emergency department for a while for observation. Sometimes admission to the hospital may be needed. After injuries such as yours, most problems occur within the first 24 hours, but side effects may occur up to 7-10 days after the injury. It is important for you to carefully monitor your condition and contact your health care provider or seek immediate medical care if there is a change in your condition. WHAT ARE THE TYPES OF HEAD INJURIES? Head injuries can be as minor as a bump. Some head injuries can be more severe. More severe head injuries include:  A jarring injury to the brain (concussion).  A bruise of  the brain (contusion). This mean there is bleeding in the brain that can cause swelling.  A cracked skull (skull fracture).  Bleeding in the brain that collects, clots, and forms a bump (hematoma). WHAT CAUSES A HEAD INJURY? A serious head injury is most likely to happen to someone who is in a car wreck and is not wearing a seat belt. Other causes of major head injuries include bicycle or motorcycle accidents, sports injuries, and falls. HOW ARE HEAD INJURIES DIAGNOSED? A complete history of the event leading to  the injury and your current symptoms will be helpful in diagnosing head injuries. Many times, pictures of the brain, such as CT or MRI are needed to see the extent of the injury. Often, an overnight hospital stay is necessary for observation.  WHEN SHOULD I SEEK IMMEDIATE MEDICAL CARE?  You should get help right away if:  You have confusion or drowsiness.  You feel sick to your stomach (nauseous) or have continued, forceful vomiting.  You have dizziness or unsteadiness that is getting worse.  You have severe, continued headaches not relieved by medicine. Only take over-the-counter or prescription medicines for pain, fever, or discomfort as directed by your health care provider.  You do not have normal function of the arms or legs or are unable to walk.  You notice changes in the black spots in the center of the colored part of your eye (pupil).  You have a clear or bloody fluid coming from your nose or ears.  You have a loss of vision. During the next 24 hours after the injury, you must stay with someone who can watch you for the warning signs. This person should contact local emergency services (911 in the [place] if you have seizures, you become unconscious, or you are unable to wake up. HOW CAN I PREVENT A HEAD INJURY IN THE FUTURE? The most important factor for preventing major head injuries is avoiding motor vehicle accidents. To minimize the potential for damage to your head, it is crucial to wear seat belts while riding in motor vehicles. Wearing helmets while bike riding and playing collision sports (like football) is also helpful. Also, avoiding dangerous activities around the house will further help reduce your risk of head injury.  WHEN CAN I RETURN TO NORMAL ACTIVITIES AND ATHLETICS? You should be reevaluated by your health care provider before returning to these activities. If you have any of the following symptoms, you should not return to activities or contact sports until 1 week  after the symptoms have stopped:  Persistent headache.  Dizziness or vertigo.  Poor attention and concentration.  Confusion.  Memory problems.  Nausea or vomiting.  Fatigue or tire easily.  Irritability.  Intolerant of bright lights or loud noises.  Anxiety or depression.  Disturbed sleep. MAKE SURE YOU:   Understand these instructions.  Will watch your condition.  Will get help right away if you are not doing well or get worse. Document Released: 12/01/2005 Document Revised: 12/06/2013 Document Reviewed: 08/08/2013 [facility] Patient Information 2015 [place], [place]. This information is not intended to replace advice given to you by your health care provider. Make sure you discuss any questions you have with your health care provider.

## 2014-09-01 NOTE — ED Notes (Signed)
Per EMS pt comes from [facility], for a fall when she was ambulating in her room and lost her balance. Pt states that she fell backwards and did hit her head. Pt denies taking anticoagulants.  Pt was ambulatory at the scene and from stretcher to bathroom here and back to stretcher.

## 2014-09-01 NOTE — ED Notes (Signed)
In patients chart to print discharge papers...klj

## 2014-09-01 NOTE — ED Provider Notes (Signed)
CSN: [id number]     Arrival date & time 09/01/14  1032 History   First MD Initiated Contact with Patient 09/01/14 1035     Chief Complaint  Patient presents with  . Fall     (Consider location/radiation/quality/duration/timing/severity/associated sxs/prior Treatment) Patient is a 80 y.o. female presenting with fall. The history is provided by the patient and the EMS personnel.  Fall Pertinent negatives include no chest pain, no abdominal pain, no headaches and no shortness of breath.  pt frmo [facility], s/p fall this morning. Pt states she was bending/reaching to pick up a glass, when lost balance, falling forward, hitting head. No loc.  Has been up and ambulatory post fall. Denies any faintness or dizziness prior to fall, and states her recent health at baseline, felt well this morning and continues to feel well now. No headache. No neck or back pain. No numbness/weakness. No cp or sob. No abd pain or nv. Denies extremity pain or injury. Skin intact.     Past Medical History  Diagnosis Date  . Type II or unspecified type diabetes mellitus without mention of complication, not stated as uncontrolled   . Hypertension   . Hyperlipidemia   . Neuropathy   . Hypothyroidism   . Anemia   . Glaucoma   . Hyperglycemia 04/2014   Past Surgical History  Procedure Laterality Date  . Breast surgery    . Cholecystectomy    . Thyroidectomy     Family History  Problem Relation Age of Onset  . Heart disease Mother   . Cancer Other   . Heart attack Other    History  Substance Use Topics  . Smoking status: Former Research scientist (life sciences)  . Smokeless tobacco: Never Used  . Alcohol Use: No   OB History   Grav Para Term Preterm Abortions TAB SAB Ect Mult Living                 Review of Systems  Constitutional: Negative for fever and chills.  HENT: Negative for nosebleeds.   Eyes: Negative for pain and visual disturbance.  Respiratory: Negative for shortness of breath.   Cardiovascular: Negative for chest  pain.  Gastrointestinal: Negative for nausea, vomiting and abdominal pain.  Genitourinary: Negative for dysuria and flank pain.  Musculoskeletal: Negative for back pain and neck pain.  Skin: Negative for wound.  Neurological: Negative for syncope, weakness, numbness and headaches.  Hematological: Does not bruise/bleed easily.  Psychiatric/Behavioral: Negative for confusion.      Allergies  Codeine and Macrobid  Home Medications   Prior to Admission medications   Medication Sig Start Date End Date Taking? Authorizing Provider  ALPRAZolam Duanne Moron) 0.5 MG tablet Take one tablet by mouth every night at bedtime 07/24/14   Tiffany L Reed, DO  amLODipine (NORVASC) 5 MG tablet Take 5 mg by mouth daily.    Historical Provider, MD  Ascorbic Acid (VITAMIN C) 1000 MG tablet Take 1,000 mg by mouth 3 (three) times daily. For urine infection    Historical Provider, MD  aspirin EC 81 MG tablet Take 81 mg by mouth daily.    Historical Provider, MD  bimatoprost (LUMIGAN) 0.01 % SOLN Place 1 drop into both eyes at bedtime.    Historical Provider, MD  CALCIUM-MAGNESIUM-ZINC PO Take 1 tablet by mouth daily.    Historical Provider, MD  Carbonyl Iron (PERFECT IRON PO) Take 1 tablet by mouth daily.    Historical Provider, MD  cefUROXime (CEFTIN) 500 MG tablet Take 1 tablet (500 mg  total) by mouth 2 (two) times daily with a meal. 07/21/14   Adeline C Viyuoh, MD  Cholecalciferol (VITAMIN D PO) Take 1,000 Units by mouth daily.    Historical Provider, MD  cycloSPORINE (RESTASIS) 0.05 % ophthalmic emulsion Place 1 drop into both eyes at bedtime.     Historical Provider, MD  dorzolamide-timolol (COSOPT) 22.3-6.8 MG/ML ophthalmic solution Place 1 drop into both eyes 2 (two) times daily.  03/21/14   Historical Provider, MD  ezetimibe (ZETIA) 10 MG tablet Take 10 mg by mouth daily.    Historical Provider, MD  gabapentin (NEURONTIN) 600 MG tablet Take 600 mg by mouth 4 (four) times daily. for neuritis pain 05/17/14 05/18/15   Unk Pinto, MD  hydrochlorothiazide (HYDRODIURIL) 25 MG tablet Take 25 mg by mouth daily.  03/17/14   Historical Provider, MD  levothyroxine (SYNTHROID, LEVOTHROID) 50 MCG tablet Take 50 mcg by mouth daily before breakfast.    Historical Provider, MD  lisinopril (PRINIVIL,ZESTRIL) 10 MG tablet Take 1 tablet (10 mg total) by mouth daily. 05/13/14   Ripudeep Krystal Eaton, MD  meclizine (ANTIVERT) 25 MG tablet Take 12.5-25 mg by mouth 3 (three) times daily as needed for dizziness (vertigo).     Historical Provider, MD  metFORMIN (GLUCOPHAGE-XR) 500 MG 24 hr tablet Take 500 mg by mouth 3 (three) times daily after meals. Takes 2 tabs at lunch if glucose above 200    Historical Provider, MD  Multiple Vitamin (MULITIVITAMIN WITH MINERALS) TABS Take 1 tablet by mouth daily.    Historical Provider, MD  omega-3 acid ethyl esters (LOVAZA) 1 G capsule Take 1 g by mouth 2 (two) times daily.     Historical Provider, MD  traMADol (ULTRAM) 50 MG tablet Take one tablet by mouth three times daily as needed for pain 07/24/14   Tiffany L Reed, DO  vitamin E 400 UNIT capsule Take 400 Units by mouth daily.    Historical Provider, MD  White Petrolatum-Mineral Oil (GENTEAL PM OP) Place 1 drop into both eyes at bedtime.    Historical Provider, MD   BP 147/82  Pulse 92  Temp(Src) 98 F (36.7 C) (Oral)  Resp 18  SpO2 98% Physical Exam  Nursing note and vitals reviewed. Constitutional: She appears well-developed and well-nourished. No distress.  HENT:  Nose: Nose normal.  Mouth/Throat: Oropharynx is clear and moist.  Contusion to superior aspect scalp, skin intact.   Eyes: Conjunctivae are normal. Pupils are equal, round, and reactive to light. No scleral icterus.  Neck: Normal range of motion. Neck supple. No tracheal deviation present.  No bruit. Normal rom without pain  Cardiovascular: Normal rate, regular rhythm, normal heart sounds and intact distal pulses.   Pulmonary/Chest: Effort normal and breath sounds normal.  No respiratory distress. She exhibits no tenderness.  Abdominal: Soft. Normal appearance and bowel sounds are normal. She exhibits no distension. There is no tenderness.  Genitourinary:  No cva tenderness.   Musculoskeletal: Normal range of motion. She exhibits no edema and no tenderness.  CTLS spine, non tender, aligned, no step off. Good rom bil ext without pain or focal bony tenderness.   Neurological: She is alert.  Alert, oriented. Speech clear/fluent. Motor intact bil, stre 5/5, sens intact. Ambulates w steady gait.      Skin: Skin is warm and dry. No rash noted. She is not diaphoretic.  Psychiatric: She has a normal mood and affect.    ED Course  Procedures (including critical care time) Labs Review   MDM  Pt denies pain and/or wanting any medication for pain.  Contusion to superior aspect scalp, otherwise no other contusion, pain or sign of trauma. No loc, no headache, no nv, no neurologic symptoms and normal exam, no anticoag use - as such, feel no indication for imaging currently.  Pt has been up and ambulatory in ED, post earlier mechanical fall, and continues to deny pain or any other complaint.  Pt appears stable for d/c to ecf.      Mirna Mires, MD 09/01/14 1049

## 2014-09-21 ENCOUNTER — Emergency Department (HOSPITAL_COMMUNITY): Payer: Medicare Other

## 2014-09-21 ENCOUNTER — Emergency Department (HOSPITAL_COMMUNITY)
Admission: EM | Admit: 2014-09-21 | Discharge: 2014-09-21 | Disposition: A | Discharge status: Home / Self Care | Payer: Medicare Other | Attending: Emergency Medicine | Admitting: Emergency Medicine

## 2014-09-21 ENCOUNTER — Encounter (HOSPITAL_COMMUNITY): Payer: Self-pay | Admitting: Emergency Medicine

## 2014-09-21 DIAGNOSIS — R197 Diarrhea, unspecified: Secondary | ICD-10-CM | POA: Insufficient documentation

## 2014-09-21 DIAGNOSIS — E785 Hyperlipidemia, unspecified: Secondary | ICD-10-CM | POA: Diagnosis not present

## 2014-09-21 DIAGNOSIS — Z862 Personal history of diseases of the blood and blood-forming organs and certain disorders involving the immune mechanism: Secondary | ICD-10-CM | POA: Diagnosis not present

## 2014-09-21 DIAGNOSIS — R1084 Generalized abdominal pain: Secondary | ICD-10-CM | POA: Insufficient documentation

## 2014-09-21 DIAGNOSIS — E119 Type 2 diabetes mellitus without complications: Secondary | ICD-10-CM | POA: Diagnosis not present

## 2014-09-21 DIAGNOSIS — G629 Polyneuropathy, unspecified: Secondary | ICD-10-CM | POA: Diagnosis not present

## 2014-09-21 DIAGNOSIS — E871 Hypo-osmolality and hyponatremia: Secondary | ICD-10-CM | POA: Insufficient documentation

## 2014-09-21 DIAGNOSIS — K5900 Constipation, unspecified: Secondary | ICD-10-CM | POA: Diagnosis not present

## 2014-09-21 DIAGNOSIS — Z79899 Other long term (current) drug therapy: Secondary | ICD-10-CM | POA: Insufficient documentation

## 2014-09-21 DIAGNOSIS — I10 Essential (primary) hypertension: Secondary | ICD-10-CM | POA: Insufficient documentation

## 2014-09-21 DIAGNOSIS — H409 Unspecified glaucoma: Secondary | ICD-10-CM | POA: Insufficient documentation

## 2014-09-21 LAB — COMPREHENSIVE METABOLIC PANEL
ALK PHOS: 83 U/L (ref 39–117)
ALT: 21 U/L (ref 0–35)
ANION GAP: 11 (ref 5–15)
AST: 30 U/L (ref 0–37)
Albumin: 3.6 g/dL (ref 3.5–5.2)
BUN: 17 mg/dL (ref 6–23)
CALCIUM: 9.8 mg/dL (ref 8.4–10.5)
CO2: 27 mEq/L (ref 19–32)
CREATININE: 0.83 mg/dL (ref 0.50–1.10)
Chloride: 85 mEq/L — ABNORMAL LOW (ref 96–112)
GFR calc non Af Amer: 63 mL/min — ABNORMAL LOW (ref >90)
GFR, EST AFRICAN AMERICAN: 73 mL/min — AB (ref >90)
GLUCOSE: 208 mg/dL — AB (ref 70–99)
Potassium: 4.7 mEq/L (ref 3.7–5.3)
Sodium: 123 mEq/L — ABNORMAL LOW (ref 137–147)
TOTAL PROTEIN: 8.2 g/dL (ref 6.0–8.3)
Total Bilirubin: 0.4 mg/dL (ref 0.3–1.2)

## 2014-09-21 LAB — I-STAT CHEM 8, ED
BUN: 13 mg/dL (ref 6–23)
CALCIUM ION: 1.15 mmol/L (ref 1.13–1.30)
CHLORIDE: 99 meq/L (ref 96–112)
Creatinine, Ser: 0.8 mg/dL (ref 0.50–1.10)
Glucose, Bld: 134 mg/dL — ABNORMAL HIGH (ref 70–99)
HEMATOCRIT: 43 % (ref 36.0–46.0)
Hemoglobin: 14.6 g/dL (ref 12.0–15.0)
Potassium: 3.8 mEq/L (ref 3.7–5.3)
Sodium: 123 mEq/L — ABNORMAL LOW (ref 137–147)
TCO2: 24 mmol/L (ref 0–100)

## 2014-09-21 LAB — CBC WITH DIFFERENTIAL/PLATELET
BASOS PCT: 1 % (ref 0–1)
Basophils Absolute: 0.1 10*3/uL (ref 0.0–0.1)
EOS PCT: 7 % — AB (ref 0–5)
Eosinophils Absolute: 0.6 10*3/uL (ref 0.0–0.7)
HCT: 38.4 % (ref 36.0–46.0)
HEMOGLOBIN: 13.3 g/dL (ref 12.0–15.0)
LYMPHS ABS: 2.3 10*3/uL (ref 0.7–4.0)
Lymphocytes Relative: 26 % (ref 12–46)
MCH: 31.2 pg (ref 26.0–34.0)
MCHC: 34.6 g/dL (ref 30.0–36.0)
MCV: 90.1 fL (ref 78.0–100.0)
MONO ABS: 0.9 10*3/uL (ref 0.1–1.0)
MONOS PCT: 11 % (ref 3–12)
Neutro Abs: 4.9 10*3/uL (ref 1.7–7.7)
Neutrophils Relative %: 55 % (ref 43–77)
Platelets: 304 10*3/uL (ref 150–400)
RBC: 4.26 MIL/uL (ref 3.87–5.11)
RDW: 12.7 % (ref 11.5–15.5)
WBC: 8.8 10*3/uL (ref 4.0–10.5)

## 2014-09-21 LAB — URINALYSIS, ROUTINE W REFLEX MICROSCOPIC
BILIRUBIN URINE: NEGATIVE
Glucose, UA: NEGATIVE mg/dL
Hgb urine dipstick: NEGATIVE
Ketones, ur: NEGATIVE mg/dL
NITRITE: NEGATIVE
Protein, ur: NEGATIVE mg/dL
SPECIFIC GRAVITY, URINE: 1.007 (ref 1.005–1.030)
UROBILINOGEN UA: 0.2 mg/dL (ref 0.0–1.0)
pH: 7 (ref 5.0–8.0)

## 2014-09-21 LAB — URINE MICROSCOPIC-ADD ON

## 2014-09-21 MED ORDER — SODIUM CHLORIDE 0.9 % IV BOLUS (SEPSIS)
500.0000 mL | Freq: Once | INTRAVENOUS | Status: AC
  Administered 2014-09-21: 500 mL via INTRAVENOUS

## 2014-09-21 MED ORDER — TRAMADOL HCL 50 MG PO TABS
50.0000 mg | ORAL_TABLET | Freq: Once | ORAL | Status: AC
  Administered 2014-09-21: 50 mg via ORAL
  Filled 2014-09-21: qty 1

## 2014-09-21 MED ORDER — IOHEXOL 300 MG/ML  SOLN
25.0000 mL | INTRAMUSCULAR | Status: AC
  Administered 2014-09-21 (×2): 25 mL via ORAL

## 2014-09-21 MED ORDER — SODIUM CHLORIDE 0.9 % IV BOLUS (SEPSIS): 500.0000 mL | Freq: Once | INTRAVENOUS | Status: DC

## 2014-09-21 NOTE — Discharge Instructions (Signed)
As discussed, it is very important that you monitor your condition carefully.  Tomorrow he'll need to have your blood sodium level will be checked, in addition to your other electrolytes.  Your CAT scan of your abdomen showed inflammatory changes in your intestines.  This should be discussed with your physician and/or gastroenterologist for further evaluation and management with consideration of additional imaging and/or testing, should your symptoms persist.  If you develop new, or concerning changes in the interim, please be sure to return here for further evaluation and management.  Hyponatremia  Hyponatremia is when the amount of salt (sodium) in your blood is too low. When sodium levels are low, your cells will absorb extra water and swell. The swelling happens throughout the body, but it mostly affects the brain. Severe brain swelling (cerebral edema), seizures, or coma can happen.  CAUSES   Heart, kidney, or liver problems.  Thyroid problems.  Adrenal gland problems.  Severe vomiting and diarrhea.  Certain medicines or illegal drugs.  Dehydration.  Drinking too much water.  Low-sodium diet. SYMPTOMS   Nausea and vomiting.  Confusion.  Lethargy.  Agitation.  Headache.  Twitching or shaking (seizures).  Unconsciousness.  Appetite loss.  Muscle weakness and cramping. DIAGNOSIS  Hyponatremia is identified by a simple blood test. Your caregiver will perform a history and physical exam to try to find the cause and type of hyponatremia. Other tests may be needed to measure the amount of sodium in your blood and urine. TREATMENT  Treatment will depend on the cause.   Fluids may be given through the vein (IV).  Medicines may be used to correct the sodium imbalance. If medicines are causing the problem, they will need to be adjusted.  Water or fluid intake may be restricted to restore proper balance. The speed of correcting the sodium problem is very important. If  the problem is corrected too fast, nerve damage (sometimes unchangeable) can happen. HOME CARE INSTRUCTIONS   Only take medicines as directed by your caregiver. Many medicines can make hyponatremia worse. Discuss all your medicines with your caregiver.  Carefully follow any recommended diet, including any fluid restrictions.  You may be asked to repeat lab tests. Follow these directions.  Avoid alcohol and recreational drugs. SEEK MEDICAL CARE IF:   You develop worsening nausea, fatigue, headache, confusion, or weakness.  Your original hyponatremia symptoms return.  You have problems following the recommended diet. SEEK IMMEDIATE MEDICAL CARE IF:   You have a seizure.  You faint.  You have ongoing diarrhea or vomiting. MAKE SURE YOU:   Understand these instructions.  Will watch your condition.  Will get help right away if you are not doing well or get worse. Document Released: 11/21/2002 Document Revised: 02/23/2012 Document Reviewed: 05/18/2011 [facility] Patient Information 2015 [place], [place]. This information is not intended to replace advice given to you by your health care provider. Make sure you discuss any questions you have with your health care provider.  Abdominal Pain, Women Abdominal (stomach, pelvic, or belly) pain can be caused by many things. It is important to tell your doctor:  The location of the pain.  Does it come and go or is it present all the time?  Are there things that start the pain (eating certain foods, exercise)?  Are there other symptoms associated with the pain (fever, nausea, vomiting, diarrhea)? All of this is helpful to know when trying to find the cause of the pain. CAUSES   Stomach: virus or bacteria infection, or ulcer.  Intestine: appendicitis (inflamed appendix), regional ileitis (Crohn's disease), ulcerative colitis (inflamed colon), irritable bowel syndrome, diverticulitis (inflamed diverticulum of the colon), or cancer of the  stomach or intestine.  Gallbladder disease or stones in the gallbladder.  Kidney disease, kidney stones, or infection.  Pancreas infection or cancer.  Fibromyalgia (pain disorder).  Diseases of the female organs:  Uterus: fibroid (non-cancerous) tumors or infection.  Fallopian tubes: infection or tubal pregnancy.  Ovary: cysts or tumors.  Pelvic adhesions (scar tissue).  Endometriosis (uterus lining tissue growing in the pelvis and on the pelvic organs).  Pelvic congestion syndrome (female organs filling up with blood just before the menstrual period).  Pain with the menstrual period.  Pain with ovulation (producing an egg).  Pain with an IUD (intrauterine device, birth control) in the uterus.  Cancer of the female organs.  Functional pain (pain not caused by a disease, may improve without treatment).  Psychological pain.  Depression. DIAGNOSIS  Your doctor will decide the seriousness of your pain by doing an examination.  Blood tests.  X-rays.  Ultrasound.  CT scan (computed tomography, special type of X-ray).  MRI (magnetic resonance imaging).  Cultures, for infection.  Barium enema (dye inserted in the large intestine, to better view it with X-rays).  Colonoscopy (looking in intestine with a lighted tube).  Laparoscopy (minor surgery, looking in abdomen with a lighted tube).  Major abdominal exploratory surgery (looking in abdomen with a large incision). TREATMENT  The treatment will depend on the cause of the pain.   Many cases can be observed and treated at home.  Over-the-counter medicines recommended by your caregiver.  Prescription medicine.  Antibiotics, for infection.  Birth control pills, for painful periods or for ovulation pain.  Hormone treatment, for endometriosis.  Nerve blocking injections.  Physical therapy.  Antidepressants.  Counseling with a psychologist or psychiatrist.  Minor or major surgery. HOME CARE  INSTRUCTIONS   Do not take laxatives, unless directed by your caregiver.  Take over-the-counter pain medicine only if ordered by your caregiver. Do not take aspirin because it can cause an upset stomach or bleeding.  Try a clear liquid diet (broth or water) as ordered by your caregiver. Slowly move to a bland diet, as tolerated, if the pain is related to the stomach or intestine.  Have a thermometer and take your temperature several times a day, and record it.  Bed rest and sleep, if it helps the pain.  Avoid sexual intercourse, if it causes pain.  Avoid stressful situations.  Keep your follow-up appointments and tests, as your caregiver orders.  If the pain does not go away with medicine or surgery, you may try:  Acupuncture.  Relaxation exercises (yoga, meditation).  Group therapy.  Counseling. SEEK MEDICAL CARE IF:   You notice certain foods cause stomach pain.  Your home care treatment is not helping your pain.  You need stronger pain medicine.  You want your IUD removed.  You feel faint or lightheaded.  You develop nausea and vomiting.  You develop a rash.  You are having side effects or an allergy to your medicine. SEEK IMMEDIATE MEDICAL CARE IF:   Your pain does not go away or gets worse.  You have a fever.  Your pain is felt only in portions of the abdomen. The right side could possibly be appendicitis. The left lower portion of the abdomen could be colitis or diverticulitis.  You are passing blood in your stools (bright red or black tarry stools, with or without  vomiting).  You have blood in your urine.  You develop chills, with or without a fever.  You pass out. MAKE SURE YOU:   Understand these instructions.  Will watch your condition.  Will get help right away if you are not doing well or get worse. Document Released: 09/28/2007 Document Revised: 04/17/2014 Document Reviewed: 10/18/2009 [facility] Patient Information 2015 [place], [place].  This information is not intended to replace advice given to you by your health care provider. Make sure you discuss any questions you have with your health care provider.

## 2014-09-21 NOTE — ED Notes (Signed)
Pt comes from [facility], c/o abdominal pain for 1 week. No pain currently. Has been having constipation and then diarrhea. Last night couldn't sleep because of pain. Pt is a  X 4.

## 2014-09-21 NOTE — ED Provider Notes (Signed)
CSN: [id number]     Arrival date & time 09/21/14  1611 History   First MD Initiated Contact with Patient 09/21/14 1627     Chief Complaint  Patient presents with  . Abdominal Pain     (Consider location/radiation/quality/duration/timing/severity/associated sxs/prior Treatment) HPI Patient presents with concerns of abdominal pain, diarrhea, constipation. Pain is upper abdominal, radiating right to left, left to right, inconsistently per There is associated change of bowel habits, with alternating diarrhea, constipation. No new fever, vomiting. Pain does not improve with anything.  The patient typically was on tramadol, though she is no longer taking this medication. She denies syncope, lightheadedness, chest pain, dyspnea. Patient is a nursing home resident.   Past Medical History  Diagnosis Date  . Type II or unspecified type diabetes mellitus without mention of complication, not stated as uncontrolled   . Hypertension   . Hyperlipidemia   . Neuropathy   . Hypothyroidism   . Anemia   . Glaucoma   . Hyperglycemia 04/2014   Past Surgical History  Procedure Laterality Date  . Breast surgery    . Cholecystectomy    . Thyroidectomy     Family History  Problem Relation Age of Onset  . Heart disease Mother   . Cancer Other   . Heart attack Other    History  Substance Use Topics  . Smoking status: Former Research scientist (life sciences)  . Smokeless tobacco: Never Used  . Alcohol Use: No   OB History   Grav Para Term Preterm Abortions TAB SAB Ect Mult Living                 Review of Systems  Constitutional:       Per HPI, otherwise negative  HENT:       Per HPI, otherwise negative  Respiratory:       Per HPI, otherwise negative  Cardiovascular:       Per HPI, otherwise negative  Gastrointestinal: Positive for diarrhea and constipation. Negative for vomiting.  Endocrine:       Negative aside from HPI  Genitourinary:       Neg aside from HPI   Musculoskeletal:       Per HPI, otherwise  negative  Skin: Negative.   Neurological: Negative for syncope.      Allergies  Codeine and Macrobid  Home Medications   Prior to Admission medications   Medication Sig Start Date End Date Taking? Authorizing Provider  ALPRAZolam Duanne Moron) 0.5 MG tablet Take one tablet by mouth every night at bedtime 07/24/14  Yes Tiffany L Reed, DO  Ascorbic Acid (VITAMIN C) 1000 MG tablet Take 1,000 mg by mouth daily.   Yes Historical Provider, MD  Cholecalciferol (VITAMIN D PO) Take 1,000 Units by mouth daily.   Yes Historical Provider, MD  cycloSPORINE (RESTASIS) 0.05 % ophthalmic emulsion Place 1 drop into both eyes 2 (two) times daily.   Yes Historical Provider, MD  dorzolamide-timolol (COSOPT) 22.3-6.8 MG/ML ophthalmic solution Place 1 drop into both eyes 2 (two) times daily.  03/21/14  Yes Historical Provider, MD  ezetimibe (ZETIA) 10 MG tablet Take 10 mg by mouth daily.   Yes Historical Provider, MD  gabapentin (NEURONTIN) 600 MG tablet Take 600 mg by mouth 4 (four) times daily. for neuritis pain 05/17/14 05/18/15 Yes Unk Pinto, MD  hydrochlorothiazide (HYDRODIURIL) 25 MG tablet Take 25 mg by mouth daily.  03/17/14  Yes Historical Provider, MD  latanoprost (XALATAN) 0.005 % ophthalmic solution Place 1 drop into both eyes at bedtime.  Yes Historical Provider, MD  lisinopril (PRINIVIL,ZESTRIL) 10 MG tablet Take 1 tablet (10 mg total) by mouth daily. 05/13/14  Yes Ripudeep Krystal Eaton, MD  LORazepam (ATIVAN) 0.5 MG tablet Take 0.5 mg by mouth 3 (three) times daily.    Yes Historical Provider, MD  meclizine (ANTIVERT) 25 MG tablet Take 25 mg by mouth daily as needed for dizziness.   Yes Historical Provider, MD  metFORMIN (GLUCOPHAGE) 850 MG tablet Take 850 mg by mouth 2 (two) times daily.   Yes Historical Provider, MD  Multiple Vitamin (MULTIVITAMIN WITH MINERALS) TABS tablet Take 1 tablet by mouth daily.   Yes Historical Provider, MD  omega-3 acid ethyl esters (LOVAZA) 1 G capsule Take 1 g by mouth 2 (two)  times daily.    Yes Historical Provider, MD  ondansetron (ZOFRAN-ODT) 4 MG disintegrating tablet Take 4 mg by mouth every 8 (eight) hours as needed for nausea or vomiting. For two weeks. Start on 08/14/14   Yes Historical Provider, MD  vitamin E 400 UNIT capsule Take 400 Units by mouth daily.   Yes Historical Provider, MD   BP 127/52  Pulse 79  Temp(Src) 97.9 F (36.6 C) (Oral)  Resp 18  Ht 5' (1.524 m)  Wt 150 lb (68.04 kg)  BMI 29.30 kg/m2  SpO2 98% Physical Exam  Nursing note and vitals reviewed. Constitutional: She is oriented to person, place, and time. She appears well-developed and well-nourished. No distress.  HENT:  Head: Normocephalic and atraumatic.  Eyes: Conjunctivae and EOM are normal.  Cardiovascular: Normal rate and regular rhythm.   Pulmonary/Chest: Effort normal and breath sounds normal. No stridor. No respiratory distress.  Abdominal: She exhibits no distension. There is tenderness in the right upper quadrant, epigastric area and left upper quadrant. There is guarding. There is no rigidity and no rebound.  Musculoskeletal: She exhibits no edema.  Neurological: She is alert and oriented to person, place, and time. No cranial nerve deficit.  Skin: Skin is warm and dry.  Psychiatric: She has a normal mood and affect.    ED Course  Procedures (including critical care time) Labs Review Labs Reviewed  CBC WITH DIFFERENTIAL - Abnormal; Notable for the following:    Eosinophils Relative 7 (*)    All other components within normal limits  COMPREHENSIVE METABOLIC PANEL - Abnormal; Notable for the following:    Sodium 123 (*)    Chloride 85 (*)    Glucose, Bld 208 (*)    GFR calc non Af Amer 63 (*)    GFR calc Af Amer 73 (*)    All other components within normal limits  URINALYSIS, ROUTINE W REFLEX MICROSCOPIC - Abnormal; Notable for the following:    Leukocytes, UA SMALL (*)    All other components within normal limits  URINE MICROSCOPIC-ADD ON  I-STAT CHEM 8, ED     Imaging Review Ct Abdomen Pelvis Wo Contrast  09/21/2014   CLINICAL DATA:  Abdominal pain with alternating constipation and diarrhea. Initial encounter.  EXAM: CT ABDOMEN AND PELVIS WITHOUT CONTRAST  TECHNIQUE: Multidetector CT imaging of the abdomen and pelvis was performed following the standard protocol without IV contrast.  COMPARISON:  CT abdomen and pelvis with contrast 05/19/2012.  FINDINGS: Bibasilar airspace disease likely reflects atelectasis. Calcifications are noted within the rhonchi. The heart is enlarged. Atherosclerotic calcifications are present within the coronary arteries. There is no significant pleural or pericardial effusion. Atherosclerotic calcifications are present in the descending thoracic and abdominal aorta without aneurysm.  The liver  and spleen are within normal limits. The stomach, duodenum, and pancreas are within normal limits. The common bile duct and gallbladder are unremarkable. The adrenal glands are normal bilaterally. There is some stranding about both kidneys. A punctate nonobstructing stone is present posteriorly in the right kidney. No other significant nephrolithiasis is present. The ureters are normal bilaterally. The urinary bladder is unremarkable.  The rectosigmoid colon is mostly collapsed. The more proximal colon is within normal limits. The appendix is not discretely visualized and may be surgically absent. The terminal ileum is narrowed with prominent fat at the ileocecal valve. There is a relative obstruction with fecal material in the slightly more proximal ileum. Dilated loops of distal small bowel measure up to 4.2 cm. There is no focal obstruction or transition. No significant free fluid is present. The urinary bladder is somewhat distended. Uterus and adnexa are within normal limits for age.  Bone windows demonstrate moderate osteopenia. Advanced degenerative changes are noted L5-S1. There is fusion of anterior osteophytes within the lower thoracic  spine. Vertebral body heights and alignment are normal.  IMPRESSION: 1. Mild distention of distal small bowel. There is narrowing of the terminal ileum. This raises concern for inflammatory bowel disease. This creates a relative obstruction. Given the patient's age, small bowel neoplasm is also considered. A discrete mass is not evident. 2. Atherosclerotic changes are noted throughout the aorta and within the coronary arteries. 3. Degenerative changes in the lumbar spine. 4. Punctate nonobstructing stone within the right kidney. 5. Mild stranding about both kidneys without hydronephrosis or obstruction.   Electronically Signed   By: Lawrence Santiago M.D.   On: 09/21/2014 19:45    On exam the patient is awake and alert, in no distress, sitting upright, requesting discharge. I discussed all findings with the patient and her son. Patient denies any ongoing pain, states that she feels substantially better with fluid rehydration. Patient has no ongoing concern for absence of bowel movements. Patient is in a monitored facility and will follow up with physicians there for further evaluation, management of her abdominal pain, hyponatremia MDM  Patient presents with concerns of ongoing upper abdominal pain, occasional constipation, diarrhea, but no deeper, chills, vomiting, weight loss, confusion, disorientation. Patient is awake and alert, appropriately interactive. Patient is hemodynamically stable patient has CT suggesting possible inflammatory bowel condition versus relative obstruction, though the patient has no lower abdominal pain and continues to have bowel movements, reassuring for the low suspicion of clinical obstruction. Patient had substantial improvement following fluid rehydration, was discharged, per her requested to follow up with her physicians at her nursing facility.    Carmin Muskrat, MD 09/21/14 2044

## 2014-09-22 ENCOUNTER — Ambulatory Visit: Payer: Self-pay | Admitting: Physician Assistant

## 2014-11-27 ENCOUNTER — Encounter: Payer: Self-pay | Admitting: Physician Assistant

## 2014-12-10 IMAGING — CR DG CHEST 1V PORT
1 series · 1 of 1 positions shown · non-contrast
Comparison: Chest radiograph performed 12/31/2012

CLINICAL DATA: Hypoxia; leukocytosis.  History of smoking.

EXAM:
PORTABLE CHEST - 1 VIEW

[AP]
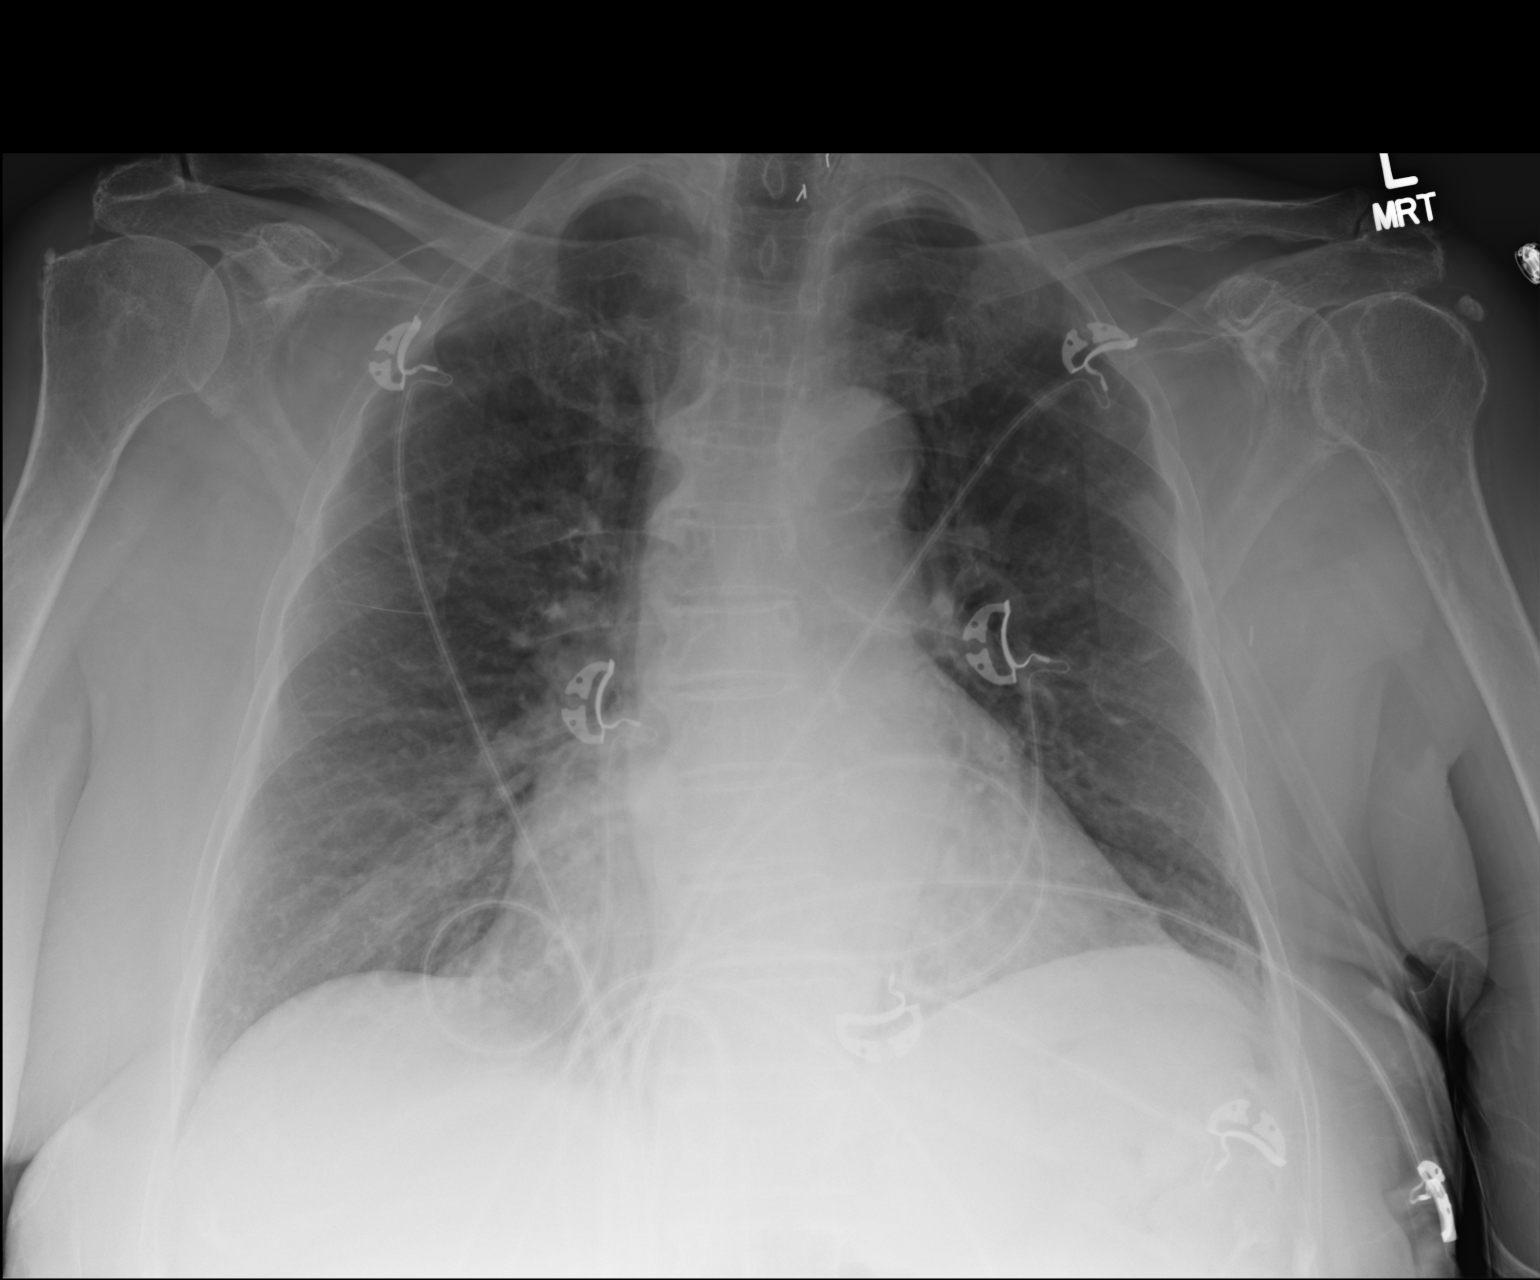

[1 of 1 positions shown; findings below may reference images not displayed]

FINDINGS: The lungs are well-aerated. Mild right basilar opacity may reflect
atelectasis. The lungs are otherwise clear. No pleural effusion or
pneumothorax is seen.

The cardiomediastinal silhouette is mildly enlarged. Scattered clips
are seen overlying the left thyroid bed. No acute osseous
abnormalities are seen. A calcification overlying the left humeral
head likely reflects left-sided calcific tendinitis. A clip is seen
at the left axilla.
IMPRESSION: 1. Mild right basilar airspace opacity may reflect atelectasis;
lungs otherwise clear.
2. Mild cardiomegaly.
3. Calcification overlying the left humeral head likely reflects
left-sided calcific tendinitis.

## 2014-12-28 ENCOUNTER — Encounter: Payer: Self-pay | Admitting: Physician Assistant

## 2015-01-03 DIAGNOSIS — H1859 Other hereditary corneal dystrophies: Secondary | ICD-10-CM | POA: Diagnosis not present

## 2015-01-03 DIAGNOSIS — H4011X2 Primary open-angle glaucoma, moderate stage: Secondary | ICD-10-CM | POA: Diagnosis not present

## 2015-01-23 DIAGNOSIS — E119 Type 2 diabetes mellitus without complications: Secondary | ICD-10-CM | POA: Diagnosis not present

## 2015-01-23 DIAGNOSIS — H409 Unspecified glaucoma: Secondary | ICD-10-CM | POA: Diagnosis not present

## 2015-01-23 DIAGNOSIS — I10 Essential (primary) hypertension: Secondary | ICD-10-CM | POA: Diagnosis not present

## 2015-01-23 DIAGNOSIS — E039 Hypothyroidism, unspecified: Secondary | ICD-10-CM | POA: Diagnosis not present

## 2015-01-24 DIAGNOSIS — E038 Other specified hypothyroidism: Secondary | ICD-10-CM | POA: Diagnosis not present

## 2015-01-24 DIAGNOSIS — Z79899 Other long term (current) drug therapy: Secondary | ICD-10-CM | POA: Diagnosis not present

## 2015-01-24 DIAGNOSIS — E039 Hypothyroidism, unspecified: Secondary | ICD-10-CM | POA: Diagnosis not present

## 2015-01-25 DIAGNOSIS — E119 Type 2 diabetes mellitus without complications: Secondary | ICD-10-CM | POA: Diagnosis not present

## 2015-02-14 DIAGNOSIS — E119 Type 2 diabetes mellitus without complications: Secondary | ICD-10-CM | POA: Diagnosis not present

## 2015-02-14 DIAGNOSIS — D649 Anemia, unspecified: Secondary | ICD-10-CM | POA: Diagnosis not present

## 2015-02-14 DIAGNOSIS — I10 Essential (primary) hypertension: Secondary | ICD-10-CM | POA: Diagnosis not present

## 2015-02-19 ENCOUNTER — Other Ambulatory Visit: Payer: Self-pay | Admitting: Internal Medicine

## 2015-02-20 DIAGNOSIS — E039 Hypothyroidism, unspecified: Secondary | ICD-10-CM | POA: Diagnosis not present

## 2015-02-20 DIAGNOSIS — I10 Essential (primary) hypertension: Secondary | ICD-10-CM | POA: Diagnosis not present

## 2015-02-20 DIAGNOSIS — H409 Unspecified glaucoma: Secondary | ICD-10-CM | POA: Diagnosis not present

## 2015-02-20 DIAGNOSIS — J069 Acute upper respiratory infection, unspecified: Secondary | ICD-10-CM | POA: Diagnosis not present

## 2015-03-29 DIAGNOSIS — R413 Other amnesia: Secondary | ICD-10-CM | POA: Diagnosis not present

## 2015-03-29 DIAGNOSIS — R2681 Unsteadiness on feet: Secondary | ICD-10-CM | POA: Diagnosis not present

## 2015-03-29 DIAGNOSIS — E039 Hypothyroidism, unspecified: Secondary | ICD-10-CM | POA: Diagnosis not present

## 2015-03-29 DIAGNOSIS — E119 Type 2 diabetes mellitus without complications: Secondary | ICD-10-CM | POA: Diagnosis not present

## 2015-04-02 DIAGNOSIS — M6281 Muscle weakness (generalized): Secondary | ICD-10-CM | POA: Diagnosis not present

## 2015-04-02 DIAGNOSIS — I10 Essential (primary) hypertension: Secondary | ICD-10-CM | POA: Diagnosis not present

## 2015-04-02 DIAGNOSIS — G629 Polyneuropathy, unspecified: Secondary | ICD-10-CM | POA: Diagnosis not present

## 2015-04-02 DIAGNOSIS — E119 Type 2 diabetes mellitus without complications: Secondary | ICD-10-CM | POA: Diagnosis not present

## 2015-04-02 DIAGNOSIS — R269 Unspecified abnormalities of gait and mobility: Secondary | ICD-10-CM | POA: Diagnosis not present

## 2015-04-03 DIAGNOSIS — G629 Polyneuropathy, unspecified: Secondary | ICD-10-CM | POA: Diagnosis not present

## 2015-04-03 DIAGNOSIS — I10 Essential (primary) hypertension: Secondary | ICD-10-CM | POA: Diagnosis not present

## 2015-04-03 DIAGNOSIS — M6281 Muscle weakness (generalized): Secondary | ICD-10-CM | POA: Diagnosis not present

## 2015-04-03 DIAGNOSIS — E119 Type 2 diabetes mellitus without complications: Secondary | ICD-10-CM | POA: Diagnosis not present

## 2015-04-03 DIAGNOSIS — R269 Unspecified abnormalities of gait and mobility: Secondary | ICD-10-CM | POA: Diagnosis not present

## 2015-04-04 DIAGNOSIS — S0500XA Injury of conjunctiva and corneal abrasion without foreign body, unspecified eye, initial encounter: Secondary | ICD-10-CM | POA: Diagnosis not present

## 2015-04-04 DIAGNOSIS — H11153 Pinguecula, bilateral: Secondary | ICD-10-CM | POA: Diagnosis not present

## 2015-04-04 DIAGNOSIS — H4011X2 Primary open-angle glaucoma, moderate stage: Secondary | ICD-10-CM | POA: Diagnosis not present

## 2015-04-05 DIAGNOSIS — M6281 Muscle weakness (generalized): Secondary | ICD-10-CM | POA: Diagnosis not present

## 2015-04-05 DIAGNOSIS — I10 Essential (primary) hypertension: Secondary | ICD-10-CM | POA: Diagnosis not present

## 2015-04-05 DIAGNOSIS — G629 Polyneuropathy, unspecified: Secondary | ICD-10-CM | POA: Diagnosis not present

## 2015-04-05 DIAGNOSIS — R269 Unspecified abnormalities of gait and mobility: Secondary | ICD-10-CM | POA: Diagnosis not present

## 2015-04-05 DIAGNOSIS — E119 Type 2 diabetes mellitus without complications: Secondary | ICD-10-CM | POA: Diagnosis not present

## 2015-04-09 DIAGNOSIS — G629 Polyneuropathy, unspecified: Secondary | ICD-10-CM | POA: Diagnosis not present

## 2015-04-09 DIAGNOSIS — E119 Type 2 diabetes mellitus without complications: Secondary | ICD-10-CM | POA: Diagnosis not present

## 2015-04-09 DIAGNOSIS — I10 Essential (primary) hypertension: Secondary | ICD-10-CM | POA: Diagnosis not present

## 2015-04-09 DIAGNOSIS — R269 Unspecified abnormalities of gait and mobility: Secondary | ICD-10-CM | POA: Diagnosis not present

## 2015-04-09 DIAGNOSIS — M6281 Muscle weakness (generalized): Secondary | ICD-10-CM | POA: Diagnosis not present

## 2015-04-10 DIAGNOSIS — G629 Polyneuropathy, unspecified: Secondary | ICD-10-CM | POA: Diagnosis not present

## 2015-04-10 DIAGNOSIS — M6281 Muscle weakness (generalized): Secondary | ICD-10-CM | POA: Diagnosis not present

## 2015-04-10 DIAGNOSIS — E119 Type 2 diabetes mellitus without complications: Secondary | ICD-10-CM | POA: Diagnosis not present

## 2015-04-10 DIAGNOSIS — R269 Unspecified abnormalities of gait and mobility: Secondary | ICD-10-CM | POA: Diagnosis not present

## 2015-04-10 DIAGNOSIS — I10 Essential (primary) hypertension: Secondary | ICD-10-CM | POA: Diagnosis not present

## 2015-04-11 DIAGNOSIS — I10 Essential (primary) hypertension: Secondary | ICD-10-CM | POA: Diagnosis not present

## 2015-04-11 DIAGNOSIS — G629 Polyneuropathy, unspecified: Secondary | ICD-10-CM | POA: Diagnosis not present

## 2015-04-11 DIAGNOSIS — E119 Type 2 diabetes mellitus without complications: Secondary | ICD-10-CM | POA: Diagnosis not present

## 2015-04-11 DIAGNOSIS — R269 Unspecified abnormalities of gait and mobility: Secondary | ICD-10-CM | POA: Diagnosis not present

## 2015-04-11 DIAGNOSIS — M6281 Muscle weakness (generalized): Secondary | ICD-10-CM | POA: Diagnosis not present

## 2015-04-12 DIAGNOSIS — R269 Unspecified abnormalities of gait and mobility: Secondary | ICD-10-CM | POA: Diagnosis not present

## 2015-04-12 DIAGNOSIS — G629 Polyneuropathy, unspecified: Secondary | ICD-10-CM | POA: Diagnosis not present

## 2015-04-12 DIAGNOSIS — E119 Type 2 diabetes mellitus without complications: Secondary | ICD-10-CM | POA: Diagnosis not present

## 2015-04-12 DIAGNOSIS — M6281 Muscle weakness (generalized): Secondary | ICD-10-CM | POA: Diagnosis not present

## 2015-04-12 DIAGNOSIS — E039 Hypothyroidism, unspecified: Secondary | ICD-10-CM | POA: Diagnosis not present

## 2015-04-12 DIAGNOSIS — I10 Essential (primary) hypertension: Secondary | ICD-10-CM | POA: Diagnosis not present

## 2015-04-13 DIAGNOSIS — E119 Type 2 diabetes mellitus without complications: Secondary | ICD-10-CM | POA: Diagnosis not present

## 2015-04-13 DIAGNOSIS — M6281 Muscle weakness (generalized): Secondary | ICD-10-CM | POA: Diagnosis not present

## 2015-04-13 DIAGNOSIS — G629 Polyneuropathy, unspecified: Secondary | ICD-10-CM | POA: Diagnosis not present

## 2015-04-13 DIAGNOSIS — R269 Unspecified abnormalities of gait and mobility: Secondary | ICD-10-CM | POA: Diagnosis not present

## 2015-04-13 DIAGNOSIS — I10 Essential (primary) hypertension: Secondary | ICD-10-CM | POA: Diagnosis not present

## 2015-04-16 DIAGNOSIS — M6281 Muscle weakness (generalized): Secondary | ICD-10-CM | POA: Diagnosis not present

## 2015-04-16 DIAGNOSIS — G629 Polyneuropathy, unspecified: Secondary | ICD-10-CM | POA: Diagnosis not present

## 2015-04-16 DIAGNOSIS — R269 Unspecified abnormalities of gait and mobility: Secondary | ICD-10-CM | POA: Diagnosis not present

## 2015-04-16 DIAGNOSIS — I10 Essential (primary) hypertension: Secondary | ICD-10-CM | POA: Diagnosis not present

## 2015-04-16 DIAGNOSIS — E119 Type 2 diabetes mellitus without complications: Secondary | ICD-10-CM | POA: Diagnosis not present

## 2015-04-17 DIAGNOSIS — R269 Unspecified abnormalities of gait and mobility: Secondary | ICD-10-CM | POA: Diagnosis not present

## 2015-04-17 DIAGNOSIS — G629 Polyneuropathy, unspecified: Secondary | ICD-10-CM | POA: Diagnosis not present

## 2015-04-17 DIAGNOSIS — E119 Type 2 diabetes mellitus without complications: Secondary | ICD-10-CM | POA: Diagnosis not present

## 2015-04-17 DIAGNOSIS — M6281 Muscle weakness (generalized): Secondary | ICD-10-CM | POA: Diagnosis not present

## 2015-04-17 DIAGNOSIS — I10 Essential (primary) hypertension: Secondary | ICD-10-CM | POA: Diagnosis not present

## 2015-04-19 DIAGNOSIS — G629 Polyneuropathy, unspecified: Secondary | ICD-10-CM | POA: Diagnosis not present

## 2015-04-19 DIAGNOSIS — M6281 Muscle weakness (generalized): Secondary | ICD-10-CM | POA: Diagnosis not present

## 2015-04-19 DIAGNOSIS — I10 Essential (primary) hypertension: Secondary | ICD-10-CM | POA: Diagnosis not present

## 2015-04-19 DIAGNOSIS — R269 Unspecified abnormalities of gait and mobility: Secondary | ICD-10-CM | POA: Diagnosis not present

## 2015-04-19 DIAGNOSIS — E119 Type 2 diabetes mellitus without complications: Secondary | ICD-10-CM | POA: Diagnosis not present

## 2015-04-20 DIAGNOSIS — G629 Polyneuropathy, unspecified: Secondary | ICD-10-CM | POA: Diagnosis not present

## 2015-04-20 DIAGNOSIS — E119 Type 2 diabetes mellitus without complications: Secondary | ICD-10-CM | POA: Diagnosis not present

## 2015-04-20 DIAGNOSIS — M6281 Muscle weakness (generalized): Secondary | ICD-10-CM | POA: Diagnosis not present

## 2015-04-20 DIAGNOSIS — I10 Essential (primary) hypertension: Secondary | ICD-10-CM | POA: Diagnosis not present

## 2015-04-20 DIAGNOSIS — R269 Unspecified abnormalities of gait and mobility: Secondary | ICD-10-CM | POA: Diagnosis not present

## 2015-04-23 IMAGING — CT CT ABD-PELV W/O CM
2 of 4 series · 10 of 46 positions shown, 11 images · non-contrast
Comparison: CT abdomen and pelvis with contrast 05/19/2012.

CLINICAL DATA: Abdominal pain with alternating constipation and
diarrhea. Initial encounter.

EXAM:
CT ABDOMEN AND PELVIS WITHOUT CONTRAST
TECHNIQUE: Multidetector CT imaging of the abdomen and pelvis was performed
following the standard protocol without IV contrast.

[Series 201: routine, idose (2) · axial · 0.74mm/px · z∈[+83,+473]mm · 7 of 96 slices shown, 8 images]
[im 9/96  soft-tissue]
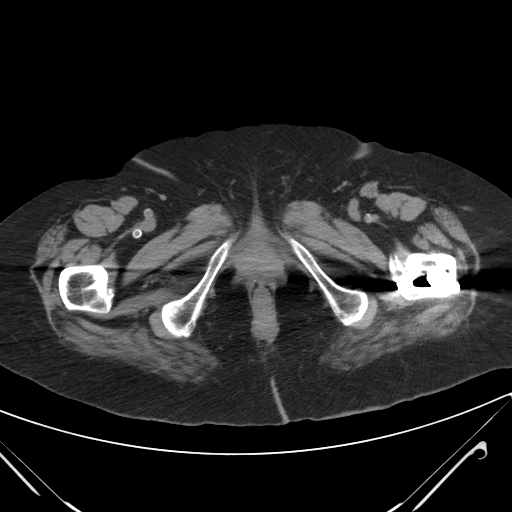
[im 9/96  bone]
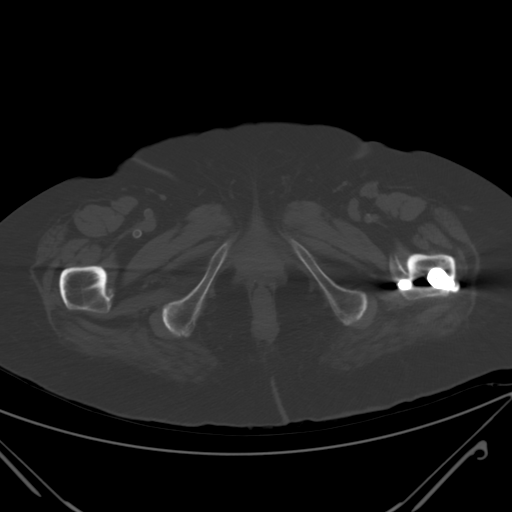
[im 21/96  soft-tissue]
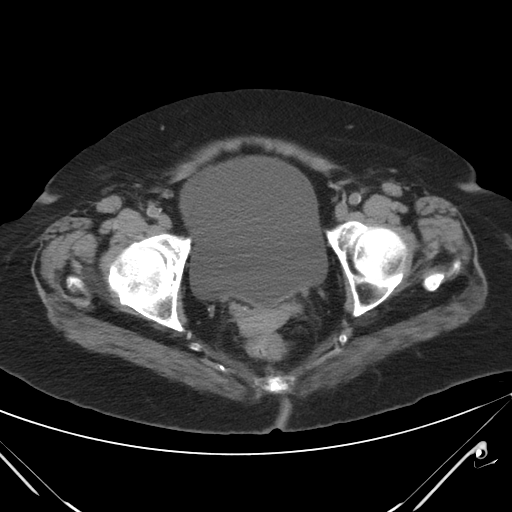
[im 34/96  soft-tissue]
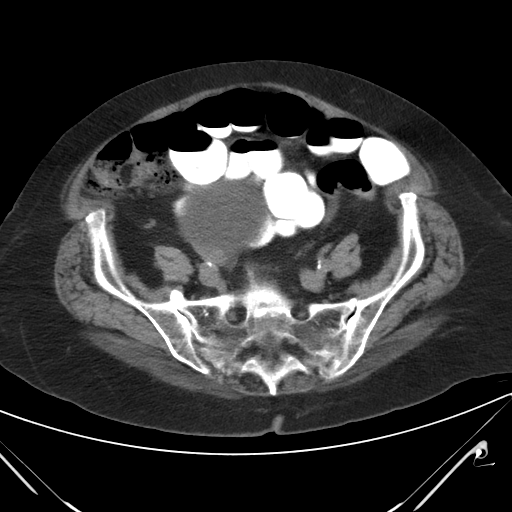
[im 50/96  soft-tissue]
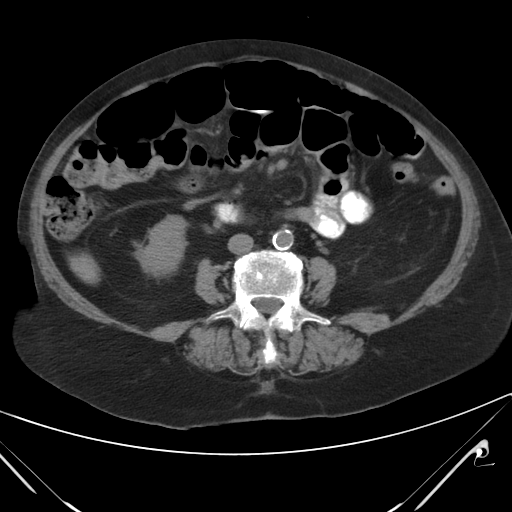
[im 62/96  soft-tissue]
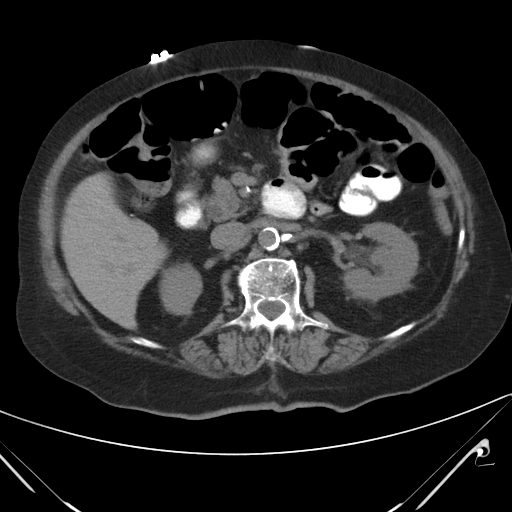
[im 75/96  soft-tissue]
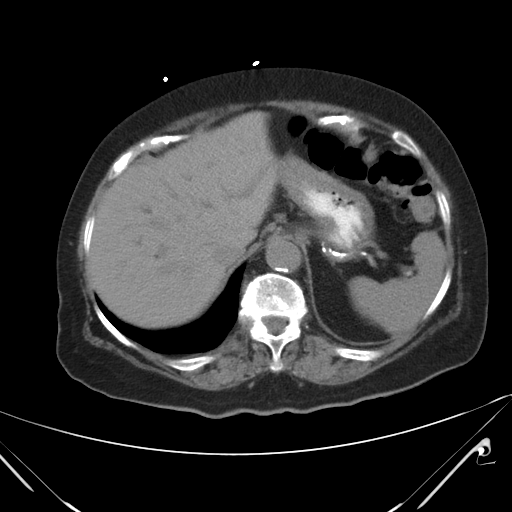
[im 87/96  soft-tissue]
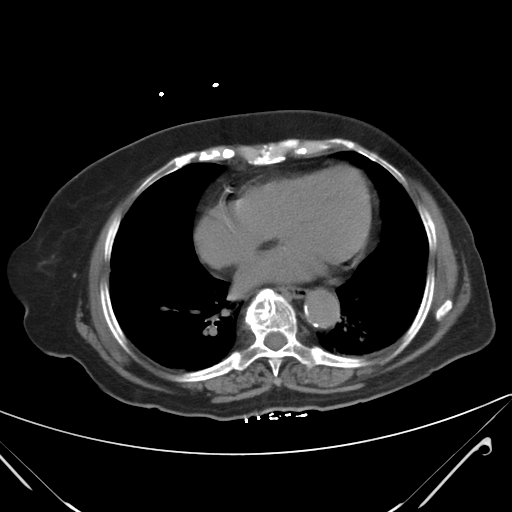

[Series 202: coronals, idose (2) · coronal · 0.45mm/px · 3 of 118 slices shown]
[im 40/118  soft-tissue]
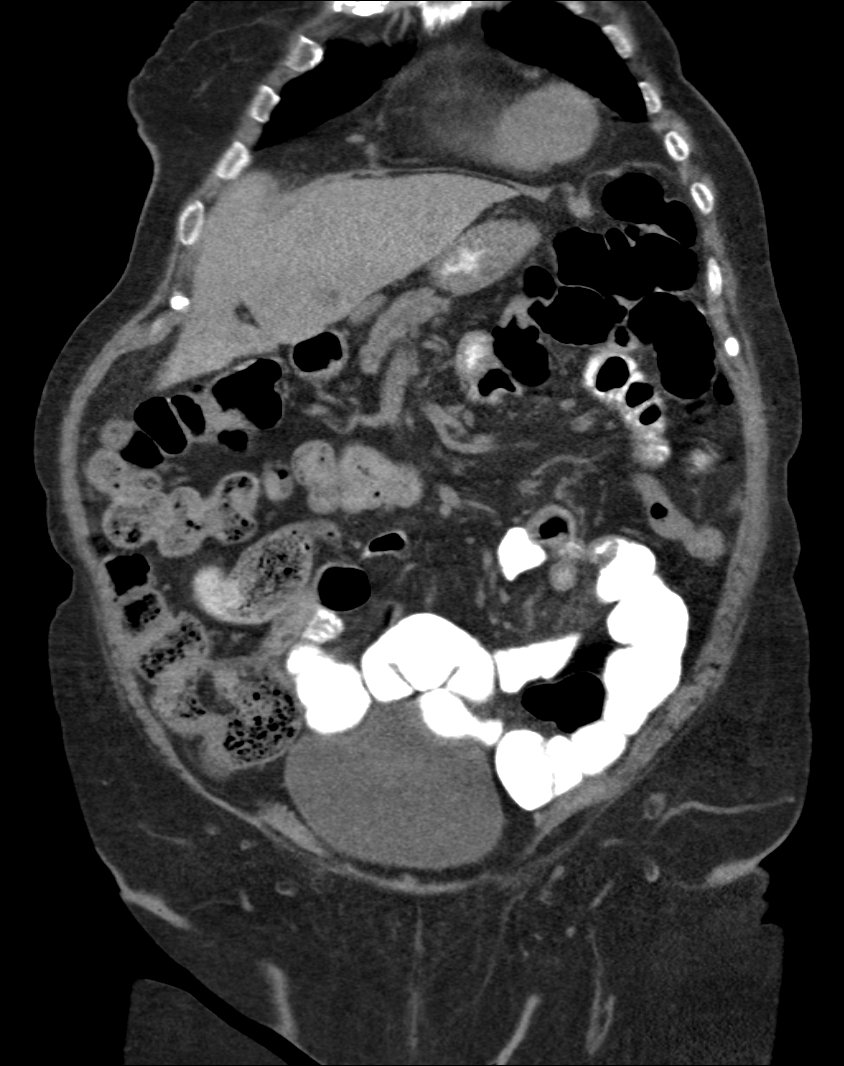
[im 53/118  soft-tissue]
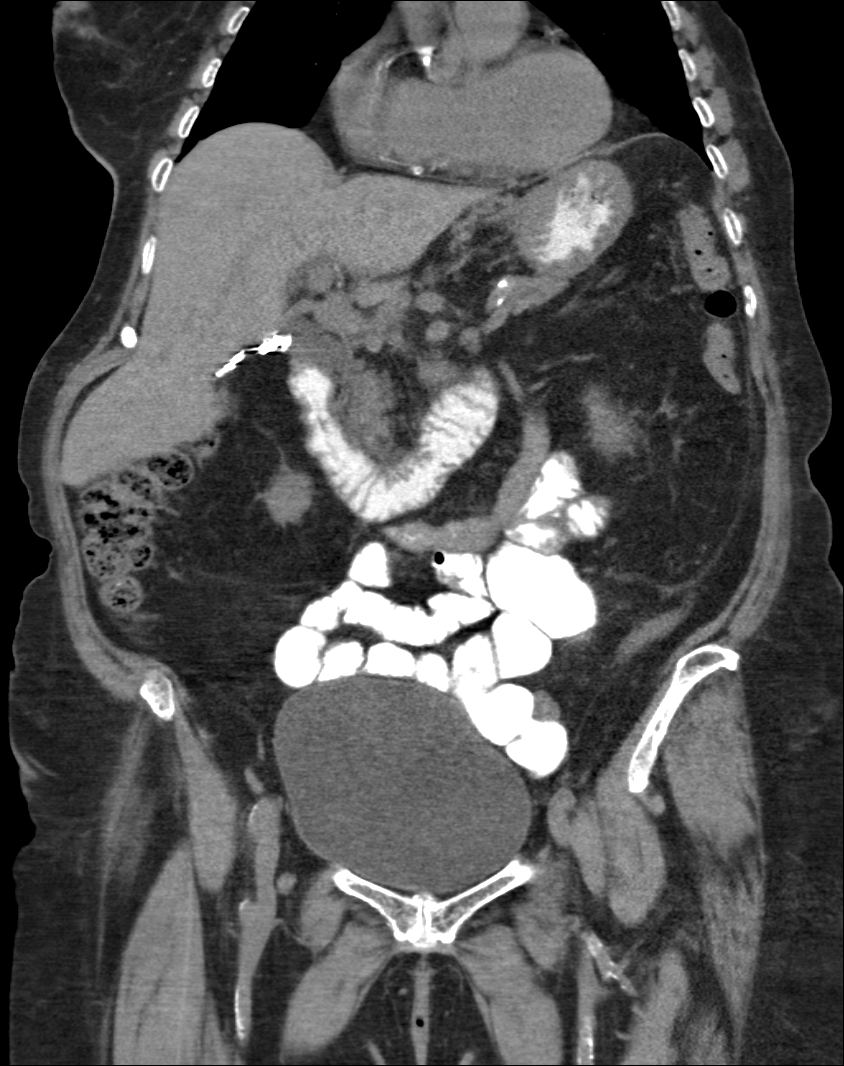
[im 66/118  soft-tissue]
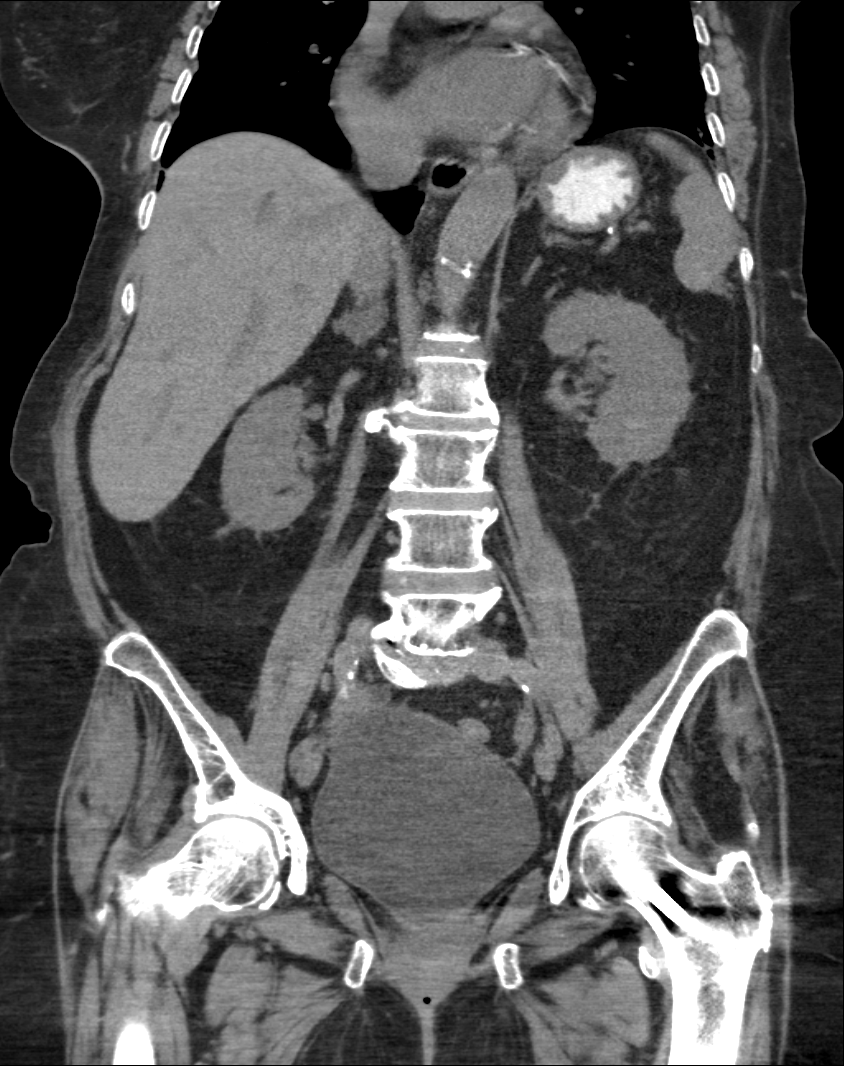

[10 of 46 positions shown; findings below may reference images not displayed]

FINDINGS: Bibasilar airspace disease likely reflects atelectasis.
Calcifications are noted within the rhonchi. The heart is enlarged.
Atherosclerotic calcifications are present within the coronary
arteries. There is no significant pleural or pericardial effusion.
Atherosclerotic calcifications are present in the descending
thoracic and abdominal aorta without aneurysm.

The liver and spleen are within normal limits. The stomach,
duodenum, and pancreas are within normal limits. The common bile
duct and gallbladder are unremarkable. The adrenal glands are normal
bilaterally. There is some stranding about both kidneys. A punctate
nonobstructing stone is present posteriorly in the right kidney. No
other significant nephrolithiasis is present. The ureters are normal
bilaterally. The urinary bladder is unremarkable.

The rectosigmoid colon is mostly collapsed. The more proximal colon
is within normal limits. The appendix is not discretely visualized
and may be surgically absent. The terminal ileum is narrowed with
prominent fat at the ileocecal valve. There is a relative
obstruction with fecal material in the slightly more proximal ileum.
Dilated loops of distal small bowel measure up to 4.2 cm. There is
no focal obstruction or transition. No significant free fluid is
present. The urinary bladder is somewhat distended. Uterus and
adnexa are within normal limits for age.

Bone windows demonstrate moderate osteopenia. Advanced degenerative
changes are noted L5-S1. There is fusion of anterior osteophytes
within the lower thoracic spine. Vertebral body heights and
alignment are normal.
IMPRESSION: 1. Mild distention of distal small bowel. There is narrowing of the
terminal ileum. This raises concern for inflammatory bowel disease.
This creates a relative obstruction. Given the patient's age, small
bowel neoplasm is also considered. A discrete mass is not evident.
2. Atherosclerotic changes are noted throughout the aorta and within
the coronary arteries.
3. Degenerative changes in the lumbar spine.
4. Punctate nonobstructing stone within the right kidney.
5. Mild stranding about both kidneys without hydronephrosis or
obstruction.

## 2015-04-24 DIAGNOSIS — I10 Essential (primary) hypertension: Secondary | ICD-10-CM | POA: Diagnosis not present

## 2015-04-24 DIAGNOSIS — E119 Type 2 diabetes mellitus without complications: Secondary | ICD-10-CM | POA: Diagnosis not present

## 2015-04-24 DIAGNOSIS — R269 Unspecified abnormalities of gait and mobility: Secondary | ICD-10-CM | POA: Diagnosis not present

## 2015-04-24 DIAGNOSIS — M6281 Muscle weakness (generalized): Secondary | ICD-10-CM | POA: Diagnosis not present

## 2015-04-24 DIAGNOSIS — G629 Polyneuropathy, unspecified: Secondary | ICD-10-CM | POA: Diagnosis not present

## 2015-04-25 DIAGNOSIS — E119 Type 2 diabetes mellitus without complications: Secondary | ICD-10-CM | POA: Diagnosis not present

## 2015-04-25 DIAGNOSIS — R269 Unspecified abnormalities of gait and mobility: Secondary | ICD-10-CM | POA: Diagnosis not present

## 2015-04-25 DIAGNOSIS — G629 Polyneuropathy, unspecified: Secondary | ICD-10-CM | POA: Diagnosis not present

## 2015-04-25 DIAGNOSIS — M6281 Muscle weakness (generalized): Secondary | ICD-10-CM | POA: Diagnosis not present

## 2015-04-25 DIAGNOSIS — I10 Essential (primary) hypertension: Secondary | ICD-10-CM | POA: Diagnosis not present

## 2015-05-07 DIAGNOSIS — M6281 Muscle weakness (generalized): Secondary | ICD-10-CM | POA: Diagnosis not present

## 2015-05-07 DIAGNOSIS — I10 Essential (primary) hypertension: Secondary | ICD-10-CM | POA: Diagnosis not present

## 2015-05-07 DIAGNOSIS — E1140 Type 2 diabetes mellitus with diabetic neuropathy, unspecified: Secondary | ICD-10-CM | POA: Diagnosis not present

## 2015-05-07 DIAGNOSIS — G629 Polyneuropathy, unspecified: Secondary | ICD-10-CM | POA: Diagnosis not present

## 2015-05-07 DIAGNOSIS — R269 Unspecified abnormalities of gait and mobility: Secondary | ICD-10-CM | POA: Diagnosis not present

## 2015-05-10 DIAGNOSIS — R2681 Unsteadiness on feet: Secondary | ICD-10-CM | POA: Diagnosis not present

## 2015-05-11 DIAGNOSIS — G629 Polyneuropathy, unspecified: Secondary | ICD-10-CM | POA: Diagnosis not present

## 2015-05-11 DIAGNOSIS — I10 Essential (primary) hypertension: Secondary | ICD-10-CM | POA: Diagnosis not present

## 2015-05-11 DIAGNOSIS — R269 Unspecified abnormalities of gait and mobility: Secondary | ICD-10-CM | POA: Diagnosis not present

## 2015-05-11 DIAGNOSIS — M6281 Muscle weakness (generalized): Secondary | ICD-10-CM | POA: Diagnosis not present

## 2015-05-11 DIAGNOSIS — E1140 Type 2 diabetes mellitus with diabetic neuropathy, unspecified: Secondary | ICD-10-CM | POA: Diagnosis not present

## 2015-05-15 DIAGNOSIS — M6281 Muscle weakness (generalized): Secondary | ICD-10-CM | POA: Diagnosis not present

## 2015-05-15 DIAGNOSIS — E1140 Type 2 diabetes mellitus with diabetic neuropathy, unspecified: Secondary | ICD-10-CM | POA: Diagnosis not present

## 2015-05-15 DIAGNOSIS — I10 Essential (primary) hypertension: Secondary | ICD-10-CM | POA: Diagnosis not present

## 2015-05-15 DIAGNOSIS — G629 Polyneuropathy, unspecified: Secondary | ICD-10-CM | POA: Diagnosis not present

## 2015-05-15 DIAGNOSIS — R269 Unspecified abnormalities of gait and mobility: Secondary | ICD-10-CM | POA: Diagnosis not present

## 2015-05-18 DIAGNOSIS — I10 Essential (primary) hypertension: Secondary | ICD-10-CM | POA: Diagnosis not present

## 2015-05-18 DIAGNOSIS — E1140 Type 2 diabetes mellitus with diabetic neuropathy, unspecified: Secondary | ICD-10-CM | POA: Diagnosis not present

## 2015-05-18 DIAGNOSIS — M6281 Muscle weakness (generalized): Secondary | ICD-10-CM | POA: Diagnosis not present

## 2015-05-18 DIAGNOSIS — G629 Polyneuropathy, unspecified: Secondary | ICD-10-CM | POA: Diagnosis not present

## 2015-05-18 DIAGNOSIS — R269 Unspecified abnormalities of gait and mobility: Secondary | ICD-10-CM | POA: Diagnosis not present

## 2015-05-22 DIAGNOSIS — M6281 Muscle weakness (generalized): Secondary | ICD-10-CM | POA: Diagnosis not present

## 2015-05-22 DIAGNOSIS — E1140 Type 2 diabetes mellitus with diabetic neuropathy, unspecified: Secondary | ICD-10-CM | POA: Diagnosis not present

## 2015-05-22 DIAGNOSIS — R269 Unspecified abnormalities of gait and mobility: Secondary | ICD-10-CM | POA: Diagnosis not present

## 2015-05-22 DIAGNOSIS — I10 Essential (primary) hypertension: Secondary | ICD-10-CM | POA: Diagnosis not present

## 2015-05-22 DIAGNOSIS — G629 Polyneuropathy, unspecified: Secondary | ICD-10-CM | POA: Diagnosis not present

## 2015-05-23 DIAGNOSIS — M6281 Muscle weakness (generalized): Secondary | ICD-10-CM | POA: Diagnosis not present

## 2015-05-23 DIAGNOSIS — I10 Essential (primary) hypertension: Secondary | ICD-10-CM | POA: Diagnosis not present

## 2015-05-23 DIAGNOSIS — E1140 Type 2 diabetes mellitus with diabetic neuropathy, unspecified: Secondary | ICD-10-CM | POA: Diagnosis not present

## 2015-05-23 DIAGNOSIS — R269 Unspecified abnormalities of gait and mobility: Secondary | ICD-10-CM | POA: Diagnosis not present

## 2015-05-23 DIAGNOSIS — G629 Polyneuropathy, unspecified: Secondary | ICD-10-CM | POA: Diagnosis not present

## 2015-05-30 DIAGNOSIS — I10 Essential (primary) hypertension: Secondary | ICD-10-CM | POA: Diagnosis not present

## 2015-05-30 DIAGNOSIS — G629 Polyneuropathy, unspecified: Secondary | ICD-10-CM | POA: Diagnosis not present

## 2015-05-30 DIAGNOSIS — E1140 Type 2 diabetes mellitus with diabetic neuropathy, unspecified: Secondary | ICD-10-CM | POA: Diagnosis not present

## 2015-05-30 DIAGNOSIS — R269 Unspecified abnormalities of gait and mobility: Secondary | ICD-10-CM | POA: Diagnosis not present

## 2015-05-30 DIAGNOSIS — M6281 Muscle weakness (generalized): Secondary | ICD-10-CM | POA: Diagnosis not present

## 2015-05-31 DIAGNOSIS — E1140 Type 2 diabetes mellitus with diabetic neuropathy, unspecified: Secondary | ICD-10-CM | POA: Diagnosis not present

## 2015-05-31 DIAGNOSIS — M6281 Muscle weakness (generalized): Secondary | ICD-10-CM | POA: Diagnosis not present

## 2015-05-31 DIAGNOSIS — G629 Polyneuropathy, unspecified: Secondary | ICD-10-CM | POA: Diagnosis not present

## 2015-05-31 DIAGNOSIS — R269 Unspecified abnormalities of gait and mobility: Secondary | ICD-10-CM | POA: Diagnosis not present

## 2015-05-31 DIAGNOSIS — I10 Essential (primary) hypertension: Secondary | ICD-10-CM | POA: Diagnosis not present

## 2015-06-07 DIAGNOSIS — M6281 Muscle weakness (generalized): Secondary | ICD-10-CM | POA: Diagnosis not present

## 2015-06-07 DIAGNOSIS — G629 Polyneuropathy, unspecified: Secondary | ICD-10-CM | POA: Diagnosis not present

## 2015-06-07 DIAGNOSIS — E1140 Type 2 diabetes mellitus with diabetic neuropathy, unspecified: Secondary | ICD-10-CM | POA: Diagnosis not present

## 2015-06-07 DIAGNOSIS — I10 Essential (primary) hypertension: Secondary | ICD-10-CM | POA: Diagnosis not present

## 2015-06-07 DIAGNOSIS — R269 Unspecified abnormalities of gait and mobility: Secondary | ICD-10-CM | POA: Diagnosis not present

## 2015-06-14 DIAGNOSIS — I10 Essential (primary) hypertension: Secondary | ICD-10-CM | POA: Diagnosis not present

## 2015-06-14 DIAGNOSIS — E1140 Type 2 diabetes mellitus with diabetic neuropathy, unspecified: Secondary | ICD-10-CM | POA: Diagnosis not present

## 2015-06-14 DIAGNOSIS — G629 Polyneuropathy, unspecified: Secondary | ICD-10-CM | POA: Diagnosis not present

## 2015-06-14 DIAGNOSIS — R269 Unspecified abnormalities of gait and mobility: Secondary | ICD-10-CM | POA: Diagnosis not present

## 2015-06-14 DIAGNOSIS — M6281 Muscle weakness (generalized): Secondary | ICD-10-CM | POA: Diagnosis not present

## 2015-07-05 DIAGNOSIS — Z9849 Cataract extraction status, unspecified eye: Secondary | ICD-10-CM | POA: Diagnosis not present

## 2015-07-05 DIAGNOSIS — H4011X2 Primary open-angle glaucoma, moderate stage: Secondary | ICD-10-CM | POA: Diagnosis not present

## 2015-07-05 DIAGNOSIS — H04122 Dry eye syndrome of left lacrimal gland: Secondary | ICD-10-CM | POA: Diagnosis not present

## 2016-05-14 ENCOUNTER — Encounter (INDEPENDENT_AMBULATORY_CARE_PROVIDER_SITE_OTHER): Payer: Medicare Other | Admitting: Ophthalmology

## 2016-05-14 DIAGNOSIS — H353121 Nonexudative age-related macular degeneration, left eye, early dry stage: Secondary | ICD-10-CM

## 2016-05-14 DIAGNOSIS — H35033 Hypertensive retinopathy, bilateral: Secondary | ICD-10-CM

## 2016-05-14 DIAGNOSIS — H43813 Vitreous degeneration, bilateral: Secondary | ICD-10-CM

## 2016-05-14 DIAGNOSIS — D3131 Benign neoplasm of right choroid: Secondary | ICD-10-CM | POA: Diagnosis not present

## 2016-05-14 DIAGNOSIS — H353112 Nonexudative age-related macular degeneration, right eye, intermediate dry stage: Secondary | ICD-10-CM | POA: Diagnosis not present

## 2016-05-14 DIAGNOSIS — I10 Essential (primary) hypertension: Secondary | ICD-10-CM | POA: Diagnosis not present

## 2017-04-06 ENCOUNTER — Encounter (INDEPENDENT_AMBULATORY_CARE_PROVIDER_SITE_OTHER): Payer: Self-pay | Admitting: Ophthalmology

## 2017-04-22 ENCOUNTER — Encounter (INDEPENDENT_AMBULATORY_CARE_PROVIDER_SITE_OTHER): Payer: Medicare Other | Admitting: Ophthalmology

## 2017-04-22 DIAGNOSIS — H26491 Other secondary cataract, right eye: Secondary | ICD-10-CM | POA: Diagnosis not present

## 2017-04-22 DIAGNOSIS — H35033 Hypertensive retinopathy, bilateral: Secondary | ICD-10-CM | POA: Diagnosis not present

## 2017-04-22 DIAGNOSIS — H43813 Vitreous degeneration, bilateral: Secondary | ICD-10-CM

## 2017-04-22 DIAGNOSIS — H348312 Tributary (branch) retinal vein occlusion, right eye, stable: Secondary | ICD-10-CM | POA: Diagnosis not present

## 2017-04-22 DIAGNOSIS — H353132 Nonexudative age-related macular degeneration, bilateral, intermediate dry stage: Secondary | ICD-10-CM | POA: Diagnosis not present

## 2017-04-22 DIAGNOSIS — D3131 Benign neoplasm of right choroid: Secondary | ICD-10-CM | POA: Diagnosis not present

## 2017-04-22 DIAGNOSIS — I110 Hypertensive heart disease with heart failure: Secondary | ICD-10-CM

## 2017-05-28 ENCOUNTER — Other Ambulatory Visit (INDEPENDENT_AMBULATORY_CARE_PROVIDER_SITE_OTHER): Payer: Medicare Other | Admitting: Ophthalmology

## 2017-05-28 DIAGNOSIS — H26491 Other secondary cataract, right eye: Secondary | ICD-10-CM

## 2017-06-29 ENCOUNTER — Other Ambulatory Visit (INDEPENDENT_AMBULATORY_CARE_PROVIDER_SITE_OTHER): Payer: Medicare Other | Admitting: Ophthalmology

## 2017-06-29 DIAGNOSIS — H348312 Tributary (branch) retinal vein occlusion, right eye, stable: Secondary | ICD-10-CM

## 2017-06-29 DIAGNOSIS — H2701 Aphakia, right eye: Secondary | ICD-10-CM | POA: Diagnosis not present

## 2017-10-30 ENCOUNTER — Encounter (INDEPENDENT_AMBULATORY_CARE_PROVIDER_SITE_OTHER): Payer: Self-pay | Admitting: Ophthalmology

## 2018-01-21 ENCOUNTER — Other Ambulatory Visit: Payer: Self-pay

## 2018-01-21 ENCOUNTER — Encounter (HOSPITAL_COMMUNITY): Payer: Self-pay | Admitting: Emergency Medicine

## 2018-01-21 DIAGNOSIS — Z79899 Other long term (current) drug therapy: Secondary | ICD-10-CM | POA: Diagnosis not present

## 2018-01-21 DIAGNOSIS — Z7984 Long term (current) use of oral hypoglycemic drugs: Secondary | ICD-10-CM | POA: Diagnosis not present

## 2018-01-21 DIAGNOSIS — R739 Hyperglycemia, unspecified: Secondary | ICD-10-CM | POA: Diagnosis not present

## 2018-01-21 DIAGNOSIS — E1140 Type 2 diabetes mellitus with diabetic neuropathy, unspecified: Secondary | ICD-10-CM | POA: Diagnosis not present

## 2018-01-21 DIAGNOSIS — N183 Chronic kidney disease, stage 3 (moderate): Secondary | ICD-10-CM | POA: Insufficient documentation

## 2018-01-21 DIAGNOSIS — Z87891 Personal history of nicotine dependence: Secondary | ICD-10-CM | POA: Insufficient documentation

## 2018-01-21 DIAGNOSIS — I129 Hypertensive chronic kidney disease with stage 1 through stage 4 chronic kidney disease, or unspecified chronic kidney disease: Secondary | ICD-10-CM | POA: Diagnosis not present

## 2018-01-21 DIAGNOSIS — E039 Hypothyroidism, unspecified: Secondary | ICD-10-CM | POA: Insufficient documentation

## 2018-01-21 DIAGNOSIS — E1122 Type 2 diabetes mellitus with diabetic chronic kidney disease: Secondary | ICD-10-CM | POA: Insufficient documentation

## 2018-01-21 LAB — CBC
HEMATOCRIT: 37 % (ref 36.0–46.0)
Hemoglobin: 12.9 g/dL (ref 12.0–15.0)
MCH: 32.7 pg (ref 26.0–34.0)
MCHC: 34.9 g/dL (ref 30.0–36.0)
MCV: 93.7 fL (ref 78.0–100.0)
Platelets: 299 10*3/uL (ref 150–400)
RBC: 3.95 MIL/uL (ref 3.87–5.11)
RDW: 13.1 % (ref 11.5–15.5)
WBC: 10.6 10*3/uL — ABNORMAL HIGH (ref 4.0–10.5)

## 2018-01-21 LAB — BASIC METABOLIC PANEL
Anion gap: 15 (ref 5–15)
BUN: 24 mg/dL — ABNORMAL HIGH (ref 6–20)
CO2: 20 mmol/L — ABNORMAL LOW (ref 22–32)
Calcium: 9.9 mg/dL (ref 8.9–10.3)
Chloride: 95 mmol/L — ABNORMAL LOW (ref 101–111)
Creatinine, Ser: 0.82 mg/dL (ref 0.44–1.00)
GLUCOSE: 380 mg/dL — AB (ref 65–99)
Potassium: 4.3 mmol/L (ref 3.5–5.1)
Sodium: 130 mmol/L — ABNORMAL LOW (ref 135–145)

## 2018-01-21 LAB — CBG MONITORING, ED: Glucose-Capillary: 375 mg/dL — ABNORMAL HIGH (ref 65–99)

## 2018-01-21 NOTE — ED Triage Notes (Signed)
Per family pt high blood sugar. Pt takes oral diabetic medicine. Denies sickness/symptoms/or pain. Pt just reports she is hungry.

## 2018-01-22 ENCOUNTER — Encounter (HOSPITAL_COMMUNITY): Payer: Self-pay | Admitting: Emergency Medicine

## 2018-01-22 ENCOUNTER — Emergency Department (HOSPITAL_COMMUNITY)
Admission: EM | Admit: 2018-01-22 | Discharge: 2018-01-22 | Disposition: A | Discharge status: Home / Self Care | Payer: Medicare Other | Attending: Emergency Medicine | Admitting: Emergency Medicine

## 2018-01-22 DIAGNOSIS — R739 Hyperglycemia, unspecified: Secondary | ICD-10-CM

## 2018-01-22 LAB — URINALYSIS, ROUTINE W REFLEX MICROSCOPIC
BACTERIA UA: NONE SEEN
Bilirubin Urine: NEGATIVE
HGB URINE DIPSTICK: NEGATIVE
KETONES UR: NEGATIVE mg/dL
Nitrite: NEGATIVE
Protein, ur: 100 mg/dL — AB
SPECIFIC GRAVITY, URINE: 1.016 (ref 1.005–1.030)
pH: 5 (ref 5.0–8.0)

## 2018-01-22 LAB — CBG MONITORING, ED
Glucose-Capillary: 312 mg/dL — ABNORMAL HIGH (ref 65–99)
Glucose-Capillary: 319 mg/dL — ABNORMAL HIGH (ref 65–99)

## 2018-01-22 MED ORDER — SODIUM CHLORIDE 0.9 % IV BOLUS (SEPSIS)
1000.0000 mL | Freq: Once | INTRAVENOUS | Status: AC
  Administered 2018-01-22: 1000 mL via INTRAVENOUS

## 2018-01-22 MED ORDER — METFORMIN HCL 500 MG PO TABS: 1000.0000 mg | ORAL_TABLET | Freq: Once | ORAL | Status: DC

## 2018-01-22 MED ORDER — INSULIN ASPART 100 UNIT/ML ~~LOC~~ SOLN
2.0000 [IU] | Freq: Once | SUBCUTANEOUS | Status: AC
  Administered 2018-01-22: 2 [IU] via SUBCUTANEOUS
  Filled 2018-01-22: qty 1

## 2018-01-22 NOTE — ED Provider Notes (Signed)
[facility] DEPT Provider Note   CSN: [id number] Arrival date & time: 01/21/18  1718     History   Chief Complaint Chief Complaint  Patient presents with  . Hyperglycemia    HPI Melissa Mccullough is a 84 y.o. female.  The history is provided by the patient.  Hyperglycemia  Blood sugar level PTA:  300 Severity:  Moderate Onset quality:  Gradual Duration:  1 day Timing:  Constant Progression:  Unchanged Chronicity:  Chronic Diabetes status:  Controlled with oral medications Context: not change in medication   Context comment:  Eating "stick candy" Relieved by:  Nothing Ineffective treatments:  None tried Associated symptoms: no abdominal pain, no chest pain, no dizziness, no fever, no increased thirst, no nausea, no polyuria and no shortness of breath   Risk factors: no hx of DKA     Past Medical History:  Diagnosis Date  . Anemia   . Glaucoma   . Hyperglycemia 04/2014  . Hyperlipidemia   . Hypertension   . Hypothyroidism   . Neuropathy   . Type II or unspecified type diabetes mellitus without mention of complication, not stated as uncontrolled     Patient Active Problem List   Diagnosis Date Noted  . Anxiety 08/03/2014  . SOB (shortness of breath) 07/17/2014  . Encephalopathy acute 07/17/2014  . Hypoxia 07/17/2014  . Hyponatremia 07/17/2014  . Acute renal insufficiency 07/17/2014  . Confusion 07/17/2014  . Vitamin D Deficiency 05/17/2014  . UTI (lower urinary tract infection) 05/10/2014  . T2_NIDDM w/ Stage 3 CKD (GFR 68 ml/min)   . Hyperlipidemia   . Neuropathy   . Hypothyroidism   . Anemia   . Glaucoma   . Community acquired pneumonia 05/19/2012  . Hypertension 05/19/2012    Past Surgical History:  Procedure Laterality Date  . BREAST SURGERY    . CHOLECYSTECTOMY    . THYROIDECTOMY      OB History    No data available       Home Medications    Prior to Admission medications   Medication Sig Start Date End  Date Taking? Authorizing Provider  amLODipine (NORVASC) 5 MG tablet Take 5 mg by mouth daily.   Yes [provider]  Ascorbic Acid (VITAMIN C) 1000 MG tablet Take 1,000 mg by mouth daily.   Yes [provider]  Cholecalciferol (VITAMIN D PO) Take 1,000 Units by mouth daily.   Yes [provider]  cycloSPORINE (RESTASIS) 0.05 % ophthalmic emulsion Place 1 drop into both eyes 2 (two) times daily.   Yes [provider]  dorzolamide-timolol (COSOPT) 22.3-6.8 MG/ML ophthalmic solution Place 1 drop into both eyes 2 (two) times daily.  03/21/14  Yes [provider]  ezetimibe (ZETIA) 10 MG tablet Take 10 mg by mouth daily.   Yes [provider]  gabapentin (NEURONTIN) 600 MG tablet Take 600 mg by mouth 4 (four) times daily.   Yes [provider]  glipiZIDE (GLUCOTROL XL) 2.5 MG 24 hr tablet Take 2.5 mg by mouth daily with breakfast.   Yes [provider]  hydrochlorothiazide (HYDRODIURIL) 25 MG tablet Take 25 mg by mouth daily.  03/17/14  Yes [provider]  latanoprost (XALATAN) 0.005 % ophthalmic solution Place 1 drop into both eyes at bedtime.   Yes [provider]  levothyroxine (SYNTHROID, LEVOTHROID) 25 MCG tablet Take 25 mcg by mouth daily before breakfast.   Yes [provider]  lisinopril (PRINIVIL,ZESTRIL) 10 MG tablet Take 1  tablet (10 mg total) by mouth daily. Patient taking differently: Take 20 mg by mouth daily.  05/13/14  Yes Rai, Ripudeep K, MD  LORazepam (ATIVAN) 0.5 MG tablet Take 0.5 mg by mouth 2 (two) times daily.    Yes [provider]  metFORMIN (GLUCOPHAGE) 1000 MG tablet Take 1,000 mg by mouth 2 (two) times daily with a meal.   Yes [provider]  Multiple Vitamins-Minerals (ICAPS AREDS 2 PO) Take 1 capsule by mouth daily.   Yes [provider]  omega-3 acid ethyl esters (LOVAZA) 1 G capsule Take 1 g by mouth 2 (two) times daily.    Yes [provider]   pioglitazone (ACTOS) 15 MG tablet Take 15 mg by mouth daily.   Yes [provider]  vitamin E 400 UNIT capsule Take 400 Units by mouth daily.   Yes [provider]  ALPRAZolam Duanne Moron) 0.5 MG tablet Take one tablet by mouth every night at bedtime Patient not taking: Reported on 01/22/2018 07/24/14   Gayland Curry, DO    Family History Family History  Problem Relation Age of Onset  . Cancer Other   . Heart attack Other   . Heart disease Mother     Social History Social History   Tobacco Use  . Smoking status: Former Research scientist (life sciences)  . Smokeless tobacco: Never Used  Substance Use Topics  . Alcohol use: No  . Drug use: No     Allergies   Codeine and Macrobid [nitrofurantoin macrocrystal]   Review of Systems Review of Systems  Constitutional: Negative for fever.  Respiratory: Negative for shortness of breath.   Cardiovascular: Negative for chest pain.  Gastrointestinal: Negative for abdominal pain and nausea.  Endocrine: Negative for polydipsia, polyphagia and polyuria.  Genitourinary: Negative for flank pain.  Neurological: Negative for dizziness.  All other systems reviewed and are negative.    Physical Exam Updated Vital Signs BP (!) 179/89 (BP Location: Left Arm)   Pulse (!) 113   Temp 97.8 F (36.6 C) (Oral)   Resp 18   SpO2 97%   Physical Exam  Constitutional: She is oriented to person, place, and time. She appears well-developed and well-nourished. No distress.  HENT:  Head: Normocephalic and atraumatic.  Left Ear: External ear normal.  Mouth/Throat: Oropharynx is clear and moist. No oropharyngeal exudate.  Eyes: Conjunctivae are normal. Pupils are equal, round, and reactive to light.  Neck: Normal range of motion. Neck supple.  Cardiovascular: Normal rate, regular rhythm, normal heart sounds and intact distal pulses.  Pulmonary/Chest: Effort normal and breath sounds normal. No stridor. She has no wheezes. She has no rales.  Abdominal: Soft.  Bowel sounds are normal. She exhibits no mass. There is no tenderness. There is no rebound and no guarding.  Musculoskeletal: Normal range of motion.  Lymphadenopathy:    She has no cervical adenopathy.  Neurological: She is alert and oriented to person, place, and time. She displays normal reflexes.  Skin: Skin is warm and dry. Capillary refill takes less than 2 seconds.  Psychiatric: She has a normal mood and affect.     ED Treatments / Results  Labs (all labs ordered are listed, but only abnormal results are displayed) Results for orders placed or performed during the hospital encounter of 06/14/15  Basic metabolic panel  Result Value Ref Range   Sodium 130 (L) 135 - 145 mmol/L   Potassium 4.3 3.5 - 5.1 mmol/L   Chloride 95 (L) 101 - 111 mmol/L  CO2 20 (L) 22 - 32 mmol/L   Glucose, Bld 380 (H) 65 - 99 mg/dL   BUN 24 (H) 6 - 20 mg/dL   Creatinine, Ser 0.82 0.44 - 1.00 mg/dL   Calcium 9.9 8.9 - 10.3 mg/dL   GFR calc non Af Amer >60 >60 mL/min   GFR calc Af Amer >60 >60 mL/min   Anion gap 15 5 - 15  CBC  Result Value Ref Range   WBC 10.6 (H) 4.0 - 10.5 K/uL   RBC 3.95 3.87 - 5.11 MIL/uL   Hemoglobin 12.9 12.0 - 15.0 g/dL   HCT 37.0 36.0 - 46.0 %   MCV 93.7 78.0 - 100.0 fL   MCH 32.7 26.0 - 34.0 pg   MCHC 34.9 30.0 - 36.0 g/dL   RDW 13.1 11.5 - 15.5 %   Platelets 299 150 - 400 K/uL  CBG monitoring, ED  Result Value Ref Range   Glucose-Capillary 375 (H) 65 - 99 mg/dL  CBG monitoring, ED  Result Value Ref Range   Glucose-Capillary 319 (H) 65 - 99 mg/dL   No results found.  Procedures Procedures (including critical care time)  Medications Ordered in ED Medications  sodium chloride 0.9 % bolus 1,000 mL (1,000 mLs Intravenous New Bag/Given 01/22/18 0151)      Final Clinical Impressions(s) / ED Diagnoses   Stop eating excessive amounts of candy.    Return for weakness, numbness, changes in vision or speech,  fevers > 100.4 unrelieved by medication, shortness of  breath, intractable vomiting, or diarrhea, abdominal pain, Inability to tolerate liquids or food, cough, altered mental status or any concerns. No signs of systemic illness or infection. The patient is nontoxic-appearing on exam and vital signs are within normal limits.    I have reviewed the triage vital signs and the nursing notes. Pertinent labs &imaging results that were available during my care of the patient were reviewed by me and considered in my medical decision making (see chart for details).  After history, exam, and medical workup I feel the patient has been appropriately medically screened and is safe for discharge home. Pertinent diagnoses were discussed with the patient. Patient was given return precautions.    Almedia Cordell, MD 01/22/18 [phone]

## 2018-01-30 ENCOUNTER — Emergency Department (HOSPITAL_BASED_OUTPATIENT_CLINIC_OR_DEPARTMENT_OTHER): Payer: Medicare Other

## 2018-01-30 ENCOUNTER — Inpatient Hospital Stay (HOSPITAL_BASED_OUTPATIENT_CLINIC_OR_DEPARTMENT_OTHER)
Admission: EM | Admit: 2018-01-30 | Discharge: 2018-02-08 | DRG: 872 | Disposition: A | Discharge status: Skilled Nursing Facility | Payer: Medicare Other | Attending: Family Medicine | Admitting: Family Medicine

## 2018-01-30 ENCOUNTER — Other Ambulatory Visit: Payer: Self-pay

## 2018-01-30 ENCOUNTER — Encounter (HOSPITAL_BASED_OUTPATIENT_CLINIC_OR_DEPARTMENT_OTHER): Payer: Self-pay | Admitting: *Deleted

## 2018-01-30 DIAGNOSIS — Z885 Allergy status to narcotic agent status: Secondary | ICD-10-CM

## 2018-01-30 DIAGNOSIS — R6520 Severe sepsis without septic shock: Secondary | ICD-10-CM | POA: Diagnosis present

## 2018-01-30 DIAGNOSIS — L89611 Pressure ulcer of right heel, stage 1: Secondary | ICD-10-CM | POA: Diagnosis present

## 2018-01-30 DIAGNOSIS — Z66 Do not resuscitate: Secondary | ICD-10-CM | POA: Diagnosis present

## 2018-01-30 DIAGNOSIS — E875 Hyperkalemia: Secondary | ICD-10-CM | POA: Diagnosis present

## 2018-01-30 DIAGNOSIS — Z9049 Acquired absence of other specified parts of digestive tract: Secondary | ICD-10-CM

## 2018-01-30 DIAGNOSIS — E890 Postprocedural hypothyroidism: Secondary | ICD-10-CM | POA: Diagnosis present

## 2018-01-30 DIAGNOSIS — Z7984 Long term (current) use of oral hypoglycemic drugs: Secondary | ICD-10-CM

## 2018-01-30 DIAGNOSIS — Z881 Allergy status to other antibiotic agents status: Secondary | ICD-10-CM

## 2018-01-30 DIAGNOSIS — E1140 Type 2 diabetes mellitus with diabetic neuropathy, unspecified: Secondary | ICD-10-CM | POA: Diagnosis present

## 2018-01-30 DIAGNOSIS — E785 Hyperlipidemia, unspecified: Secondary | ICD-10-CM | POA: Diagnosis present

## 2018-01-30 DIAGNOSIS — Z7189 Other specified counseling: Secondary | ICD-10-CM

## 2018-01-30 DIAGNOSIS — L8990 Pressure ulcer of unspecified site, unspecified stage: Secondary | ICD-10-CM

## 2018-01-30 DIAGNOSIS — E872 Acidosis: Secondary | ICD-10-CM | POA: Diagnosis present

## 2018-01-30 DIAGNOSIS — H409 Unspecified glaucoma: Secondary | ICD-10-CM | POA: Diagnosis present

## 2018-01-30 DIAGNOSIS — N179 Acute kidney failure, unspecified: Secondary | ICD-10-CM | POA: Diagnosis present

## 2018-01-30 DIAGNOSIS — A419 Sepsis, unspecified organism: Secondary | ICD-10-CM | POA: Diagnosis not present

## 2018-01-30 DIAGNOSIS — N289 Disorder of kidney and ureter, unspecified: Secondary | ICD-10-CM | POA: Diagnosis not present

## 2018-01-30 DIAGNOSIS — Z79890 Hormone replacement therapy (postmenopausal): Secondary | ICD-10-CM

## 2018-01-30 DIAGNOSIS — R197 Diarrhea, unspecified: Secondary | ICD-10-CM

## 2018-01-30 DIAGNOSIS — Z79899 Other long term (current) drug therapy: Secondary | ICD-10-CM

## 2018-01-30 DIAGNOSIS — Z8249 Family history of ischemic heart disease and other diseases of the circulatory system: Secondary | ICD-10-CM

## 2018-01-30 DIAGNOSIS — F0390 Unspecified dementia without behavioral disturbance: Secondary | ICD-10-CM | POA: Diagnosis present

## 2018-01-30 DIAGNOSIS — E11649 Type 2 diabetes mellitus with hypoglycemia without coma: Secondary | ICD-10-CM | POA: Diagnosis not present

## 2018-01-30 DIAGNOSIS — E861 Hypovolemia: Secondary | ICD-10-CM | POA: Diagnosis present

## 2018-01-30 DIAGNOSIS — E039 Hypothyroidism, unspecified: Secondary | ICD-10-CM | POA: Diagnosis not present

## 2018-01-30 DIAGNOSIS — H9190 Unspecified hearing loss, unspecified ear: Secondary | ICD-10-CM | POA: Diagnosis present

## 2018-01-30 DIAGNOSIS — E860 Dehydration: Secondary | ICD-10-CM | POA: Diagnosis present

## 2018-01-30 DIAGNOSIS — Z515 Encounter for palliative care: Secondary | ICD-10-CM | POA: Diagnosis not present

## 2018-01-30 DIAGNOSIS — B952 Enterococcus as the cause of diseases classified elsewhere: Secondary | ICD-10-CM | POA: Diagnosis present

## 2018-01-30 DIAGNOSIS — Z87891 Personal history of nicotine dependence: Secondary | ICD-10-CM

## 2018-01-30 DIAGNOSIS — K7460 Unspecified cirrhosis of liver: Secondary | ICD-10-CM | POA: Diagnosis present

## 2018-01-30 DIAGNOSIS — I10 Essential (primary) hypertension: Secondary | ICD-10-CM | POA: Diagnosis not present

## 2018-01-30 DIAGNOSIS — A0472 Enterocolitis due to Clostridium difficile, not specified as recurrent: Secondary | ICD-10-CM | POA: Diagnosis present

## 2018-01-30 DIAGNOSIS — R339 Retention of urine, unspecified: Secondary | ICD-10-CM | POA: Diagnosis present

## 2018-01-30 DIAGNOSIS — N19 Unspecified kidney failure: Secondary | ICD-10-CM | POA: Diagnosis present

## 2018-01-30 DIAGNOSIS — A414 Sepsis due to anaerobes: Secondary | ICD-10-CM | POA: Diagnosis present

## 2018-01-30 DIAGNOSIS — M79606 Pain in leg, unspecified: Secondary | ICD-10-CM | POA: Diagnosis present

## 2018-01-30 DIAGNOSIS — B9689 Other specified bacterial agents as the cause of diseases classified elsewhere: Secondary | ICD-10-CM | POA: Diagnosis not present

## 2018-01-30 DIAGNOSIS — A498 Other bacterial infections of unspecified site: Secondary | ICD-10-CM

## 2018-01-30 LAB — BASIC METABOLIC PANEL
Anion gap: 12 (ref 5–15)
Anion gap: 12 (ref 5–15)
BUN: 88 mg/dL — AB (ref 6–20)
CO2: 17 mmol/L — ABNORMAL LOW (ref 22–32)
CO2: 18 mmol/L — ABNORMAL LOW (ref 22–32)
CREATININE: 4.26 mg/dL — AB (ref 0.44–1.00)
Calcium: 9.3 mg/dL (ref 8.9–10.3)
Chloride: 106 mmol/L (ref 101–111)
Chloride: 106 mmol/L (ref 101–111)
GFR calc Af Amer: 10 mL/min — ABNORMAL LOW (ref >60)
GFR calc Af Amer: 10 mL/min — ABNORMAL LOW (ref >60)
GFR calc non Af Amer: 9 mL/min — ABNORMAL LOW (ref >60)
Glucose, Bld: 79 mg/dL (ref 65–99)
Glucose, Bld: 57 mg/dL — ABNORMAL LOW (ref 65–99)
Potassium: 5.6 mmol/L — ABNORMAL HIGH (ref 3.5–5.1)
Potassium: 6 mmol/L — ABNORMAL HIGH (ref 3.5–5.1)
SODIUM: 135 mmol/L (ref 135–145)
Sodium: 136 mmol/L (ref 135–145)

## 2018-01-30 LAB — CBC WITH DIFFERENTIAL/PLATELET
BASOS ABS: 0 10*3/uL (ref 0.0–0.1)
Basophils Relative: 0 %
EOS PCT: 1 %
Eosinophils Absolute: 0.2 10*3/uL (ref 0.0–0.7)
HCT: 36.4 % (ref 36.0–46.0)
Hemoglobin: 11.9 g/dL — ABNORMAL LOW (ref 12.0–15.0)
Lymphocytes Relative: 18 %
Lymphs Abs: 2.6 10*3/uL (ref 0.7–4.0)
MCH: 32.2 pg (ref 26.0–34.0)
MCHC: 32.7 g/dL (ref 30.0–36.0)
MCV: 98.6 fL (ref 78.0–100.0)
MONOS PCT: 10 %
Monocytes Absolute: 1.4 10*3/uL — ABNORMAL HIGH (ref 0.1–1.0)
Neutro Abs: 10.3 10*3/uL — ABNORMAL HIGH (ref 1.7–7.7)
Neutrophils Relative %: 71 %
PLATELETS: 246 10*3/uL (ref 150–400)
RBC: 3.69 MIL/uL — ABNORMAL LOW (ref 3.87–5.11)
RDW: 13.6 % (ref 11.5–15.5)
WBC: 14.5 10*3/uL — ABNORMAL HIGH (ref 4.0–10.5)

## 2018-01-30 LAB — URINALYSIS, ROUTINE W REFLEX MICROSCOPIC
Bilirubin Urine: NEGATIVE
Glucose, UA: NEGATIVE mg/dL
Hgb urine dipstick: NEGATIVE
Ketones, ur: NEGATIVE mg/dL
LEUKOCYTES UA: NEGATIVE
NITRITE: NEGATIVE
PROTEIN: NEGATIVE mg/dL
Specific Gravity, Urine: 1.02 (ref 1.005–1.030)
pH: 5.5 (ref 5.0–8.0)

## 2018-01-30 LAB — COMPREHENSIVE METABOLIC PANEL
ALT: 26 U/L (ref 14–54)
ANION GAP: 12 (ref 5–15)
AST: 29 U/L (ref 15–41)
Albumin: 3.1 g/dL — ABNORMAL LOW (ref 3.5–5.0)
Alkaline Phosphatase: 64 U/L (ref 38–126)
BUN: 95 mg/dL — AB (ref 6–20)
CO2: 21 mmol/L — ABNORMAL LOW (ref 22–32)
Calcium: 10 mg/dL (ref 8.9–10.3)
Chloride: 101 mmol/L (ref 101–111)
Creatinine, Ser: 4.3 mg/dL — ABNORMAL HIGH (ref 0.44–1.00)
GFR, EST AFRICAN AMERICAN: 10 mL/min — AB (ref >60)
GFR, EST NON AFRICAN AMERICAN: 8 mL/min — AB (ref >60)
Glucose, Bld: 92 mg/dL (ref 65–99)
POTASSIUM: 7.2 mmol/L — AB (ref 3.5–5.1)
Sodium: 134 mmol/L — ABNORMAL LOW (ref 135–145)
TOTAL PROTEIN: 7.2 g/dL (ref 6.5–8.1)
Total Bilirubin: 0.6 mg/dL (ref 0.3–1.2)

## 2018-01-30 LAB — LIPASE, BLOOD: LIPASE: 48 U/L (ref 11–51)

## 2018-01-30 LAB — I-STAT CG4 LACTIC ACID, ED
Lactic Acid, Venous: 3.44 mmol/L (ref 0.5–1.9)
Lactic Acid, Venous: 4.6 mmol/L (ref 0.5–1.9)

## 2018-01-30 LAB — OCCULT BLOOD X 1 CARD TO LAB, STOOL: Fecal Occult Bld: POSITIVE — AB

## 2018-01-30 LAB — BASIC METABOLIC PANEL WITH GFR
BUN: 84 mg/dL — ABNORMAL HIGH (ref 6–20)
Calcium: 8.9 mg/dL (ref 8.9–10.3)
Creatinine, Ser: 4.32 mg/dL — ABNORMAL HIGH (ref 0.44–1.00)
GFR calc non Af Amer: 8 mL/min — ABNORMAL LOW (ref >60)

## 2018-01-30 LAB — POTASSIUM: POTASSIUM: 7 mmol/L — AB (ref 3.5–5.1)

## 2018-01-30 MED ORDER — INSULIN ASPART 100 UNIT/ML IV SOLN
10.0000 [IU] | Freq: Once | INTRAVENOUS | Status: AC
  Administered 2018-01-30: 10 [IU] via INTRAVENOUS
  Filled 2018-01-30: qty 1

## 2018-01-30 MED ORDER — CALCIUM GLUCONATE 10 % IV SOLN
1.0000 g | Freq: Once | INTRAVENOUS | Status: AC
  Administered 2018-01-30: 1 g via INTRAVENOUS
  Filled 2018-01-30: qty 10

## 2018-01-30 MED ORDER — ACETAMINOPHEN 325 MG PO TABS
650.0000 mg | ORAL_TABLET | Freq: Four times a day (QID) | ORAL | Status: DC | PRN
Start: 2018-01-30 — End: 2018-02-08
  Administered 2018-02-04: 650 mg via ORAL
  Filled 2018-01-30: qty 2

## 2018-01-30 MED ORDER — LEVOTHYROXINE SODIUM 25 MCG PO TABS
25.0000 ug | ORAL_TABLET | Freq: Every day | ORAL | Status: DC
  Administered 2018-01-31 – 2018-02-08 (×9): 25 ug via ORAL
  Filled 2018-01-30 (×9): qty 1

## 2018-01-30 MED ORDER — SODIUM CHLORIDE 0.9 % IV SOLN
INTRAVENOUS | Status: DC
  Administered 2018-01-31: 07:00:00 via INTRAVENOUS

## 2018-01-30 MED ORDER — LORAZEPAM 0.5 MG PO TABS
0.5000 mg | ORAL_TABLET | Freq: Two times a day (BID) | ORAL | Status: DC
  Administered 2018-01-30 – 2018-02-08 (×13): 0.5 mg via ORAL
  Filled 2018-01-30 (×16): qty 1

## 2018-01-30 MED ORDER — LATANOPROST 0.005 % OP SOLN
1.0000 [drp] | Freq: Every day | OPHTHALMIC | Status: DC
  Administered 2018-01-30 – 2018-02-07 (×9): 1 [drp] via OPHTHALMIC
  Filled 2018-01-30: qty 2.5

## 2018-01-30 MED ORDER — VITAMIN C 500 MG PO TABS
1000.0000 mg | ORAL_TABLET | Freq: Every day | ORAL | Status: DC
  Administered 2018-01-31 – 2018-02-08 (×9): 1000 mg via ORAL
  Filled 2018-01-30 (×9): qty 2

## 2018-01-30 MED ORDER — VANCOMYCIN 50 MG/ML ORAL SOLUTION
125.0000 mg | Freq: Four times a day (QID) | ORAL | Status: DC
  Administered 2018-01-30 – 2018-02-08 (×36): 125 mg via ORAL
  Filled 2018-01-30 (×43): qty 2.5

## 2018-01-30 MED ORDER — SODIUM CHLORIDE 0.9 % IV SOLN
INTRAVENOUS | Status: AC
  Filled 2018-01-30: qty 10

## 2018-01-30 MED ORDER — ONDANSETRON HCL 4 MG/2ML IJ SOLN: 4.0000 mg | Freq: Four times a day (QID) | INTRAMUSCULAR | Status: DC | PRN

## 2018-01-30 MED ORDER — HYDROCODONE-ACETAMINOPHEN 5-325 MG PO TABS
1.0000 | ORAL_TABLET | ORAL | Status: DC | PRN
  Administered 2018-02-02: 1 via ORAL
  Administered 2018-02-02: 2 via ORAL
  Administered 2018-02-03: 1 via ORAL
  Administered 2018-02-04 – 2018-02-08 (×5): 2 via ORAL
  Filled 2018-01-30 (×2): qty 1
  Filled 2018-01-30 (×4): qty 2
  Filled 2018-01-30: qty 1
  Filled 2018-01-30 (×2): qty 2

## 2018-01-30 MED ORDER — SODIUM CHLORIDE 0.9% FLUSH
3.0000 mL | Freq: Two times a day (BID) | INTRAVENOUS | Status: DC
  Administered 2018-01-30 – 2018-02-06 (×11): 3 mL via INTRAVENOUS

## 2018-01-30 MED ORDER — ACETAMINOPHEN 650 MG RE SUPP
650.0000 mg | Freq: Four times a day (QID) | RECTAL | Status: DC | PRN
Start: 2018-01-30 — End: 2018-02-08

## 2018-01-30 MED ORDER — OMEGA-3-ACID ETHYL ESTERS 1 G PO CAPS
1.0000 g | ORAL_CAPSULE | Freq: Two times a day (BID) | ORAL | Status: DC
  Administered 2018-01-30 – 2018-02-08 (×18): 1 g via ORAL
  Filled 2018-01-30 (×18): qty 1

## 2018-01-30 MED ORDER — CYCLOSPORINE 0.05 % OP EMUL
1.0000 [drp] | Freq: Two times a day (BID) | OPHTHALMIC | Status: DC
  Administered 2018-01-30 – 2018-02-08 (×18): 1 [drp] via OPHTHALMIC
  Filled 2018-01-30 (×20): qty 1

## 2018-01-30 MED ORDER — EZETIMIBE 10 MG PO TABS
10.0000 mg | ORAL_TABLET | Freq: Every day | ORAL | Status: DC
  Administered 2018-01-31 – 2018-02-08 (×9): 10 mg via ORAL
  Filled 2018-01-30 (×9): qty 1

## 2018-01-30 MED ORDER — DEXTROSE 50 % IV SOLN
1.0000 | Freq: Once | INTRAVENOUS | Status: AC
  Administered 2018-01-30: 50 mL via INTRAVENOUS
  Filled 2018-01-30: qty 50

## 2018-01-30 MED ORDER — SODIUM CHLORIDE 0.9 % IV BOLUS (SEPSIS)
1000.0000 mL | Freq: Once | INTRAVENOUS | Status: AC
  Administered 2018-01-30: 1000 mL via INTRAVENOUS

## 2018-01-30 MED ORDER — SODIUM CHLORIDE 0.9 % IV SOLN
INTRAVENOUS | Status: DC
  Administered 2018-01-30: 18:00:00 via INTRAVENOUS

## 2018-01-30 MED ORDER — GABAPENTIN 300 MG PO CAPS
300.0000 mg | ORAL_CAPSULE | Freq: Two times a day (BID) | ORAL | Status: DC
  Administered 2018-01-30 – 2018-02-02 (×6): 300 mg via ORAL
  Filled 2018-01-30 (×6): qty 1

## 2018-01-30 MED ORDER — DORZOLAMIDE HCL-TIMOLOL MAL 2-0.5 % OP SOLN
1.0000 [drp] | Freq: Two times a day (BID) | OPHTHALMIC | Status: DC
  Administered 2018-01-30 – 2018-02-08 (×18): 1 [drp] via OPHTHALMIC
  Filled 2018-01-30: qty 10

## 2018-01-30 MED ORDER — PIPERACILLIN-TAZOBACTAM 3.375 G IVPB 30 MIN
3.3750 g | Freq: Once | INTRAVENOUS | Status: AC
  Administered 2018-01-30: 3.375 g via INTRAVENOUS
  Filled 2018-01-30 (×2): qty 50

## 2018-01-30 MED ORDER — GABAPENTIN 600 MG PO TABS: 600.0000 mg | ORAL_TABLET | Freq: Four times a day (QID) | ORAL | Status: DC

## 2018-01-30 MED ORDER — STERILE WATER FOR INJECTION IV SOLN
150.0000 meq | INTRAVENOUS | Status: DC
  Administered 2018-01-30 – 2018-01-31 (×2): 150 meq via INTRAVENOUS
  Filled 2018-01-30 (×3): qty 850

## 2018-01-30 MED ORDER — SODIUM BICARBONATE 8.4 % IV SOLN
50.0000 meq | Freq: Once | INTRAVENOUS | Status: AC
  Administered 2018-01-30: 50 meq via INTRAVENOUS
  Filled 2018-01-30: qty 50

## 2018-01-30 MED ORDER — VITAMIN D 1000 UNITS PO TABS
1000.0000 [IU] | ORAL_TABLET | Freq: Every day | ORAL | Status: DC
  Administered 2018-01-31 – 2018-02-08 (×9): 1000 [IU] via ORAL
  Filled 2018-01-30 (×9): qty 1

## 2018-01-30 MED ORDER — METRONIDAZOLE IN NACL 5-0.79 MG/ML-% IV SOLN
500.0000 mg | Freq: Once | INTRAVENOUS | Status: DC
  Filled 2018-01-30: qty 100

## 2018-01-30 MED ORDER — HEPARIN SODIUM (PORCINE) 5000 UNIT/ML IJ SOLN
5000.0000 [IU] | Freq: Three times a day (TID) | INTRAMUSCULAR | Status: DC
  Administered 2018-01-30 – 2018-02-08 (×27): 5000 [IU] via SUBCUTANEOUS
  Filled 2018-01-30 (×26): qty 1

## 2018-01-30 MED ORDER — PIPERACILLIN-TAZOBACTAM 3.375 G IVPB
3.3750 g | Freq: Two times a day (BID) | INTRAVENOUS | Status: DC
  Filled 2018-01-30: qty 50

## 2018-01-30 MED ORDER — VITAMIN E 180 MG (400 UNIT) PO CAPS
400.0000 [IU] | ORAL_CAPSULE | Freq: Every day | ORAL | Status: DC
  Administered 2018-01-31 – 2018-02-08 (×9): 400 [IU] via ORAL
  Filled 2018-01-30 (×9): qty 1

## 2018-01-30 MED ORDER — ALBUTEROL SULFATE (2.5 MG/3ML) 0.083% IN NEBU
10.0000 mg | INHALATION_SOLUTION | Freq: Once | RESPIRATORY_TRACT | Status: AC
  Administered 2018-01-30: 10 mg via RESPIRATORY_TRACT
  Filled 2018-01-30: qty 12

## 2018-01-30 MED ORDER — ONDANSETRON HCL 4 MG PO TABS: 4.0000 mg | ORAL_TABLET | Freq: Four times a day (QID) | ORAL | Status: DC | PRN

## 2018-01-30 MED ORDER — INSULIN ASPART 100 UNIT/ML ~~LOC~~ SOLN
0.0000 [IU] | Freq: Three times a day (TID) | SUBCUTANEOUS | Status: DC
  Administered 2018-02-02: 2 [IU] via SUBCUTANEOUS
  Administered 2018-02-03: 1 [IU] via SUBCUTANEOUS
  Administered 2018-02-05: 3 [IU] via SUBCUTANEOUS
  Administered 2018-02-06 (×3): 2 [IU] via SUBCUTANEOUS
  Administered 2018-02-07: 1 [IU] via SUBCUTANEOUS
  Administered 2018-02-07 – 2018-02-08 (×5): 2 [IU] via SUBCUTANEOUS

## 2018-01-30 NOTE — ED Provider Notes (Signed)
Spoke with Dr. Marval Regal who recommended 1 amp of bicarb.  Also continue hydration.  Repeat BMP pending.  He would like to be notified when patient arrives.   Blanchie Dessert, MD 01/30/18 1753

## 2018-01-30 NOTE — ED Notes (Signed)
Patient transported to CT 

## 2018-01-30 NOTE — H&P (Signed)
History and Physical    Melissa Mccullough SPQ:[id number] DOB: 09-19-1928 DOA: 01/30/2018  PCP: System, Pcp Not In   Patient coming from: Assisted living facility    Chief Complaint: Diarrhea  HPI: Melissa Mccullough is a 84 y.o. female with medical history significant of type 2 diabetes on oral medication, hypertension, glaucoma, hyperlipidemia, hypothyroidism who comes in with diarrhea for approximately 1 week.  Per report of the patient and her family she began to have nausea vomiting and diarrhea approximately 8 days ago.  She was seen in the ER where she was noted to be dehydrated and treated symptomatically.  Per the family her nausea vomiting resolved.  It but was never bloody or bilious.  Her diarrhea continued however.  She began to have multiple bowel movements a day.  There was never any blood frank blood in the bowel movements.  On discussion with the patient she reports that she had crampy abdominal pain that was intermittent but wax and wane throughout the day.  The pain did not radiate.  She would have some relief of the pain with having a bowel movement.  Eating made the pain worse.  She also endorsed tenesmus.  She began to have innumerable bowel movements and family brought her back to the ED at [facility].  She denied any fevers, rash, travel, chest pain, shortness of breath, hematemesis, hematochezia, paroxysmal nocturnal dyspnea, lower extremity edema.   ED Course: In the ED patient met sepsis criteria based on her heart rate and elevated white blood cell count of 14.5 and elevated lactate.  She was noted to be in acute renal failure with potassium of 7.2 and creatinine of 4.3.  Lactate was 3.4 and rose to 4.6.  Patient was given sepsis bolus fluids, a dose of Zosyn and started on oral vancomycin.  Nephrology was consulted and recommended sodium bicarbonate.  CT of the abdomen did not show any acute process.  Repeat potassium was down to 5.6.  EKG reportedly did not show  any acute ST segment changes.  Review of Systems: As per HPI otherwise 10 point review of systems negative.    Past Medical History:  Diagnosis Date  . Anemia   . Glaucoma   . Hyperglycemia 04/2014  . Hyperlipidemia   . Hypertension   . Hypothyroidism   . Neuropathy   . Type II or unspecified type diabetes mellitus without mention of complication, not stated as uncontrolled     Past Surgical History:  Procedure Laterality Date  . BREAST SURGERY    . CHOLECYSTECTOMY    . THYROIDECTOMY       reports that she has quit smoking. she has never used smokeless tobacco. She reports that she does not drink alcohol or use drugs.  Allergies  Allergen Reactions  . Codeine Rash  . Macrobid [Nitrofurantoin Macrocrystal] Rash    Family History  Problem Relation Age of Onset  . Cancer Other   . Heart attack Other   . Heart disease Mother      Prior to Admission medications   Medication Sig Start Date End Date Taking? Authorizing Provider  ALPRAZolam Duanne Moron) 0.5 MG tablet Take one tablet by mouth every night at bedtime Patient not taking: Reported on 01/22/2018 07/24/14   Mariea Clonts, Tiffany L, DO  amLODipine (NORVASC) 5 MG tablet Take 5 mg by mouth daily.    [provider]  Ascorbic Acid (VITAMIN C) 1000 MG tablet Take 1,000 mg by mouth daily.    [provider]  Cholecalciferol (VITAMIN D PO) Take 1,000 Units by mouth daily.    [provider]  cycloSPORINE (RESTASIS) 0.05 % ophthalmic emulsion Place 1 drop into both eyes 2 (two) times daily.    [provider]  dorzolamide-timolol (COSOPT) 22.3-6.8 MG/ML ophthalmic solution Place 1 drop into both eyes 2 (two) times daily.  03/21/14   [provider]  ezetimibe (ZETIA) 10 MG tablet Take 10 mg by mouth daily.    [provider]  gabapentin (NEURONTIN) 600 MG tablet Take 600 mg by mouth 4 (four) times daily.    [provider]  glipiZIDE (GLUCOTROL XL) 2.5 MG 24 hr tablet Take 2.5  mg by mouth daily with breakfast.    [provider]  hydrochlorothiazide (HYDRODIURIL) 25 MG tablet Take 25 mg by mouth daily.  03/17/14   [provider]  latanoprost (XALATAN) 0.005 % ophthalmic solution Place 1 drop into both eyes at bedtime.    [provider]  levothyroxine (SYNTHROID, LEVOTHROID) 25 MCG tablet Take 25 mcg by mouth daily before breakfast.    [provider]  lisinopril (PRINIVIL,ZESTRIL) 10 MG tablet Take 1 tablet (10 mg total) by mouth daily. Patient taking differently: Take 20 mg by mouth daily.  05/13/14   Rai, Ripudeep K, MD  LORazepam (ATIVAN) 0.5 MG tablet Take 0.5 mg by mouth 2 (two) times daily.     [provider]  metFORMIN (GLUCOPHAGE) 1000 MG tablet Take 1,000 mg by mouth 2 (two) times daily with a meal.    [provider]  Multiple Vitamins-Minerals (ICAPS AREDS 2 PO) Take 1 capsule by mouth daily.    [provider]  omega-3 acid ethyl esters (LOVAZA) 1 G capsule Take 1 g by mouth 2 (two) times daily.     [provider]  pioglitazone (ACTOS) 15 MG tablet Take 15 mg by mouth daily.    [provider]  vitamin E 400 UNIT capsule Take 400 Units by mouth daily.    [provider]    Physical Exam: Vitals:   01/30/18 1900 01/30/18 1930 01/30/18 1940 01/30/18 2120  BP: 139/79 (!) 122/50  113/66  Pulse: (!) 105 (!) 104  99  Resp: (!) 27 19  18   Temp:   97.6 F (36.4 C) 97.8 F (36.6 C)  TempSrc:   Oral Oral  SpO2: 95% 95%  98%  Weight:      Height:        Constitutional: NAD, calm, comfortable Vitals:   01/30/18 1900 01/30/18 1930 01/30/18 1940 01/30/18 2120  BP: 139/79 (!) 122/50  113/66  Pulse: (!) 105 (!) 104  99  Resp: (!) 27 19  18   Temp:   97.6 F (36.4 C) 97.8 F (36.6 C)  TempSrc:   Oral Oral  SpO2: 95% 95%  98%  Weight:      Height:       Eyes: Anicteric sclera ENMT: Dry mucous membranes Neck: normal, supple Respiratory: clear to auscultation  bilaterally, no wheezing, no crackles. Normal respiratory effort. No accessory muscle use.  Cardiovascular: Regular rate and rhythm, no murmurs / rubs / gallops. .  Abdomen: Soft, nondistended, nontender, plus bowel sounds Musculoskeletal: Trace lower extremity edema Skin: no rashes Neurologic: Grossly intact moving all extremities Psychiatric: Normal judgment and insight. Alert and oriented x 3. Normal mood.    Labs on Admission: I have personally reviewed following labs and imaging studies  CBC: Recent Labs  Lab 01/30/18 1426  WBC 14.5*  NEUTROABS 10.3*  HGB 11.9*  HCT 36.4  MCV 98.6  PLT 967   Basic Metabolic Panel: Recent Labs  Lab 01/30/18 1426 01/30/18 1534 01/30/18 1753  NA 134*  --  135  K 7.2* 7.0* 5.6*  CL 101  --  106  CO2 21*  --  17*  GLUCOSE 92  --  79  BUN 95*  --  88*  CREATININE 4.30*  --  4.26*  CALCIUM 10.0  --  9.3   GFR: Estimated Creatinine Clearance: 6.7 mL/min (A) (by C-G formula based on SCr of 4.26 mg/dL (H)). Liver Function Tests: Recent Labs  Lab 01/30/18 1426  AST 29  ALT 26  ALKPHOS 64  BILITOT 0.6  PROT 7.2  ALBUMIN 3.1*   Recent Labs  Lab 01/30/18 1426  LIPASE 48   No results for input(s): AMMONIA in the last 168 hours. Coagulation Profile: No results for input(s): INR, PROTIME in the last 168 hours. Cardiac Enzymes: No results for input(s): CKTOTAL, CKMB, CKMBINDEX, TROPONINI in the last 168 hours. BNP (last 3 results) No results for input(s): PROBNP in the last 8760 hours. HbA1C: No results for input(s): HGBA1C in the last 72 hours. CBG: No results for input(s): GLUCAP in the last 168 hours. Lipid Profile: No results for input(s): CHOL, HDL, LDLCALC, TRIG, CHOLHDL, LDLDIRECT in the last 72 hours. Thyroid Function Tests: No results for input(s): TSH, T4TOTAL, FREET4, T3FREE, THYROIDAB in the last 72 hours. Anemia Panel: No results for input(s): VITAMINB12, FOLATE, FERRITIN, TIBC, IRON, RETICCTPCT in the last 72  hours. Urine analysis:    Component Value Date/Time   COLORURINE YELLOW 01/30/2018 1450   APPEARANCEUR CLEAR 01/30/2018 1450   LABSPEC 1.020 01/30/2018 1450   PHURINE 5.5 01/30/2018 1450   GLUCOSEU NEGATIVE 01/30/2018 1450   HGBUR NEGATIVE 01/30/2018 1450   BILIRUBINUR NEGATIVE 01/30/2018 1450   KETONESUR NEGATIVE 01/30/2018 1450   PROTEINUR NEGATIVE 01/30/2018 1450   UROBILINOGEN 0.2 09/21/2014 1821   NITRITE NEGATIVE 01/30/2018 1450   LEUKOCYTESUR NEGATIVE 01/30/2018 1450    Radiological Exams on Admission: Ct Abdomen Pelvis Wo Contrast  Result Date: 01/30/2018 CLINICAL DATA:  Abdominal pain.  Suspect colitis. EXAM: CT ABDOMEN AND PELVIS WITHOUT CONTRAST TECHNIQUE: Multidetector CT imaging of the abdomen and pelvis was performed following the standard protocol without IV contrast. COMPARISON:  09/21/2014 FINDINGS: Lower chest: Moderate cardiac enlargement. Aortic atherosclerosis. Calcification in the RCA and left circumflex coronary arteries noted. Small bilateral pleural effusions identified within the lung bases. Hepatobiliary: The liver has a nodular contour and there is hypertrophy of the lateral segment of left lobe of liver. No focal liver abnormality identified. Previous cholecystectomy. The proximal common bile duct measures up to 1.2 cm. Pancreas: Unremarkable. No pancreatic ductal dilatation or surrounding inflammatory changes. Spleen: Normal in size without focal abnormality. Adrenals/Urinary Tract: Normal adrenal glands. Chronic bilateral and symmetric perinephric soft tissue stranding compatible with prior insult. No mass or hydronephrosis. Urinary bladder appears normal. Stomach/Bowel: No abnormal distension of the stomach. The small bowel loops have a normal caliber. The appendix is visualized and appears normal. There is no pathologic dilatation of the colon. No significant large bowel wall thickening or inflammation. Vascular/Lymphatic: Aortic atherosclerosis. No aneurysm.  Scattered small upper abdominal lymph nodes identified but no adenopathy. No pelvic or inguinal adenopathy. Reproductive: Uterus and bilateral adnexa are unremarkable. Other: No significant free fluid or fluid collections identified within the abdomen or pelvis. Musculoskeletal: Previous left femur ORIF. The bones appear diffusely osteopenic. There is degenerative disc  disease identified throughout the lower thoracic and lumbar spine. IMPRESSION: 1. Nodular liver compatible with cirrhosis. 2. Previous cholecystectomy with increase caliber of the CBD. No choledocholithiasis noted. 3. Cardiac enlargement, aortic atherosclerosis and coronary artery calcifications. Aortic Atherosclerosis (ICD10-I70.0). 4. Small pleural effusions. Electronically Signed   By: Kerby Moors M.D.   On: 01/30/2018 17:38    EKG: Independently reviewed.  Sinus, no peak T waves, slightly prolonged QRS  Assessment/Plan Principal Problem:   Acute renal insufficiency Active Problems:   Essential hypertension   Hyperlipidemia   Hypothyroidism   Renal failure   Sepsis ([place])   Diarrhea   ##) Sepsis due to gastrointestinal illness: Suspect most likely causes C. difficile as patient does live in assisted living facility however she does not have any recent antibiotics and she is not on a PPI. -Continue oral vancomycin 125 every 6 -Follow-up stool C. difficile -Follow-up stool culture -Aggressive IV hydration, patient received sepsis bolus in the ED  ##) Severe hyperkalemia: The setting of acute renal insufficiency.  Improved with bicarbonate and IV fluids. -Telemetry -Monitor frequently  ##) Acute kidney injury: Clearly appears to be prerenal.  Baseline creatinine appears to be almost normal.  ##) Type 2 diabetes: - Hold home glipizide, metformin, p.o. glitazone -Sliding scale insulin, before meals at bedtime  ##) Hypertension: -Hold amlodipine 5 mg Hold lisinopril 10 mg in the setting of AK I and elevated  potassium -Hold HCTZ 25 mg  ##) Hypothyroidism: -Continue levothyroxine 25 mcg  ##)Hyperlipidemia: -Continue Zetia 10 mg  Fluids: 125 normal saline Electrolytes: Monitor potassium closely Nutrition: Low-carb diet  Prophylaxis: Heparin  Disposition: Pending improvement of renal function   Cristy Folks MD [facility]  If 7PM-7AM, please contact night-coverage [email or web address] Password [facility]  01/30/2018, 9:52 PM

## 2018-01-30 NOTE — ED Provider Notes (Addendum)
[facility] EMERGENCY DEPARTMENT Provider Note  CSN: [id number] Arrival date & time: 01/30/18 1318  Chief Complaint(s) Diarrhea  HPI Malynda B Auzenne is a 84 y.o. female   The history is provided by a relative, the spouse and the patient.  Diarrhea   This is a new problem. Episode onset: 1 week. The problem occurs 5 to 10 times per day. The problem has not changed since onset.The stool consistency is described as watery. There has been no fever. Associated symptoms include vomiting (stopped last week) and headaches (mild). Pertinent negatives include no abdominal pain, no URI and no cough. She has tried anti-motility drugs for the symptoms. The treatment provided mild relief.  feels fatigued.   Family reports black stools. H/o polyps on prior colonoscopy. No prior GI bleeds. Has been taking Pepto.  No prior C.diff. No recent Abx. No suspicious food intake. No travel.   Past Medical History Past Medical History:  Diagnosis Date  . Anemia   . Glaucoma   . Hyperglycemia 04/2014  . Hyperlipidemia   . Hypertension   . Hypothyroidism   . Neuropathy   . Type II or unspecified type diabetes mellitus without mention of complication, not stated as uncontrolled    Patient Active Problem List   Diagnosis Date Noted  . Anxiety 08/03/2014  . SOB (shortness of breath) 07/17/2014  . Encephalopathy acute 07/17/2014  . Hypoxia 07/17/2014  . Hyponatremia 07/17/2014  . Acute renal insufficiency 07/17/2014  . Confusion 07/17/2014  . Vitamin D Deficiency 05/17/2014  . UTI (lower urinary tract infection) 05/10/2014  . T2_NIDDM w/ Stage 3 CKD (GFR 68 ml/min)   . Hyperlipidemia   . Neuropathy   . Hypothyroidism   . Anemia   . Glaucoma   . Community acquired pneumonia 05/19/2012  . Hypertension 05/19/2012   Home Medication(s) Prior to Admission medications   Medication Sig Start Date End Date Taking? Authorizing Provider  ALPRAZolam Duanne Moron) 0.5 MG tablet Take one tablet by mouth  every night at bedtime Patient not taking: Reported on 01/22/2018 07/24/14   Mariea Clonts, Tiffany L, DO  amLODipine (NORVASC) 5 MG tablet Take 5 mg by mouth daily.    [provider]  Ascorbic Acid (VITAMIN C) 1000 MG tablet Take 1,000 mg by mouth daily.    [provider]  Cholecalciferol (VITAMIN D PO) Take 1,000 Units by mouth daily.    [provider]  cycloSPORINE (RESTASIS) 0.05 % ophthalmic emulsion Place 1 drop into both eyes 2 (two) times daily.    [provider]  dorzolamide-timolol (COSOPT) 22.3-6.8 MG/ML ophthalmic solution Place 1 drop into both eyes 2 (two) times daily.  03/21/14   [provider]  ezetimibe (ZETIA) 10 MG tablet Take 10 mg by mouth daily.    [provider]  gabapentin (NEURONTIN) 600 MG tablet Take 600 mg by mouth 4 (four) times daily.    [provider]  glipiZIDE (GLUCOTROL XL) 2.5 MG 24 hr tablet Take 2.5 mg by mouth daily with breakfast.    [provider]  hydrochlorothiazide (HYDRODIURIL) 25 MG tablet Take 25 mg by mouth daily.  03/17/14   [provider]  latanoprost (XALATAN) 0.005 % ophthalmic solution Place 1 drop into both eyes at bedtime.    [provider]  levothyroxine (SYNTHROID, LEVOTHROID) 25 MCG tablet Take 25 mcg by mouth daily before breakfast.    [provider]  lisinopril (PRINIVIL,ZESTRIL) 10 MG tablet Take 1 tablet (10 mg total) by mouth daily. Patient taking  differently: Take 20 mg by mouth daily.  05/13/14   Rai, Ripudeep K, MD  LORazepam (ATIVAN) 0.5 MG tablet Take 0.5 mg by mouth 2 (two) times daily.     [provider]  metFORMIN (GLUCOPHAGE) 1000 MG tablet Take 1,000 mg by mouth 2 (two) times daily with a meal.    [provider]  Multiple Vitamins-Minerals (ICAPS AREDS 2 PO) Take 1 capsule by mouth daily.    [provider]  omega-3 acid ethyl esters (LOVAZA) 1 G capsule Take 1 g by mouth 2 (two) times daily.     [provider]  pioglitazone (ACTOS) 15 MG tablet Take 15 mg by mouth daily.    [provider]  vitamin E 400 UNIT capsule Take 400 Units by mouth daily.    [provider]                                                                                                                                    Past Surgical History Past Surgical History:  Procedure Laterality Date  . BREAST SURGERY    . CHOLECYSTECTOMY    . THYROIDECTOMY     Family History Family History  Problem Relation Age of Onset  . Cancer Other   . Heart attack Other   . Heart disease Mother     Social History Social History   Tobacco Use  . Smoking status: Former Research scientist (life sciences)  . Smokeless tobacco: Never Used  Substance Use Topics  . Alcohol use: No  . Drug use: No   Allergies Codeine and Macrobid [nitrofurantoin macrocrystal]  Review of Systems Review of Systems  Respiratory: Negative for cough.   Gastrointestinal: Positive for diarrhea and vomiting (stopped last week). Negative for abdominal pain.  Genitourinary: Positive for dysuria.  Neurological: Positive for headaches (mild).  All other systems are reviewed and are negative for acute change except as noted in the HPI    Physical Exam Vital Signs  I have reviewed the triage vital signs BP 111/69 (BP Location: Right Arm)   Pulse 72   Temp 97.6 F (36.4 C) (Oral)   Resp 16   Ht 5' (1.524 m)   Wt 54.4 kg (120 lb)   SpO2 100%   BMI 23.44 kg/m   Physical Exam  Constitutional: She is oriented to person, place, and time. She appears well-developed and well-nourished. She appears ill. No distress.  HENT:  Head: Normocephalic and atraumatic.  Nose: Nose normal.  Eyes: Conjunctivae and EOM are normal. Pupils are equal, round, and reactive to light. Right eye exhibits no discharge. Left eye exhibits no discharge. No scleral icterus.  Neck: Normal range of motion. Neck supple.  Cardiovascular: Normal rate and regular rhythm. Exam  reveals no gallop and no friction rub.  No murmur heard. Pulmonary/Chest: Effort normal and breath sounds normal. No stridor. No respiratory distress. She has no rales.  Abdominal: Soft. She exhibits  no distension. There is no tenderness. There is no rigidity, no rebound and no guarding.  Musculoskeletal: She exhibits no edema or tenderness.  Neurological: She is alert and oriented to person, place, and time.  Skin: Skin is warm and dry. No rash noted. She is not diaphoretic. No erythema.  Psychiatric: She has a normal mood and affect.  Vitals reviewed.   ED Results and Treatments Labs (all labs ordered are listed, but only abnormal results are displayed) Labs Reviewed  CBC WITH DIFFERENTIAL/PLATELET - Abnormal; Notable for the following components:      Result Value   WBC 14.5 (*)    RBC 3.69 (*)    Hemoglobin 11.9 (*)    Neutro Abs 10.3 (*)    Monocytes Absolute 1.4 (*)    All other components within normal limits  COMPREHENSIVE METABOLIC PANEL - Abnormal; Notable for the following components:   Sodium 134 (*)    Potassium 7.2 (*)    CO2 21 (*)    BUN 95 (*)    Creatinine, Ser 4.30 (*)    Albumin 3.1 (*)    GFR calc non Af Amer 8 (*)    GFR calc Af Amer 10 (*)    All other components within normal limits  OCCULT BLOOD X 1 CARD TO LAB, STOOL - Abnormal; Notable for the following components:   Fecal Occult Bld POSITIVE (*)    All other components within normal limits  POTASSIUM - Abnormal; Notable for the following components:   Potassium 7.0 (*)    All other components within normal limits  GASTROINTESTINAL PANEL BY PCR, STOOL (REPLACES STOOL CULTURE)  CULTURE, BLOOD (ROUTINE X 2)  CULTURE, BLOOD (ROUTINE X 2)  C DIFFICILE QUICK SCREEN W PCR REFLEX  LIPASE, BLOOD  URINALYSIS, ROUTINE W REFLEX MICROSCOPIC  POC OCCULT BLOOD, ED  I-STAT CG4 LACTIC ACID, ED                                                                                                                          EKG  EKG Interpretation  Date/Time:  Saturday January 30 2018 16:07:12 EST Ventricular Rate:  80 PR Interval:    QRS Duration: 104 QT Interval:  348 QTC Calculation: 402 R Axis:   84 Text Interpretation:  Sinus rhythm Borderline right axis deviation Borderline low voltage, extremity leads No significant change since last tracing Confirmed by Addison Lank [id number]) on 01/30/2018 4:18:08 PM      Radiology No results found. Pertinent labs & imaging results that were available during my care of the patient were reviewed by me and considered in my medical decision making (see chart for details).  Medications Ordered in ED Medications  insulin aspart (novoLOG) injection 10 Units (not administered)  dextrose 50 % solution 50 mL (not administered)  piperacillin-tazobactam (ZOSYN) IVPB 3.375 g (not administered)  metroNIDAZOLE (FLAGYL) IVPB 500 mg (not administered)  sodium chloride 0.9 % bolus 1,000 mL (not administered)  sodium chloride 0.9 %  with calcium gluconate ADS Med (not administered)  piperacillin-tazobactam (ZOSYN) IVPB 3.375 g (not administered)  vancomycin (VANCOCIN) 50 mg/mL oral solution 125 mg (not administered)  sodium chloride 0.9 % bolus 1,000 mL (0 mLs Intravenous Stopped 01/30/18 1541)  calcium gluconate inj 10% (1 g) URGENT USE ONLY! (1 g Intravenous Given 01/30/18 1621)  albuterol (PROVENTIL) (2.5 MG/3ML) 0.083% nebulizer solution 10 mg (10 mg Nebulization Given 01/30/18 1614)                                                                                                                                    Procedures Procedures CRITICAL CARE Performed by: Grayce Sessions Cardama Total critical care time: 40 minutes Critical care time was exclusive of separately billable procedures and treating other patients. Critical care was necessary to treat or prevent imminent or life-threatening deterioration. Critical care was time spent personally by me on the following  activities: development of treatment plan with patient and/or surrogate as well as nursing, discussions with consultants, evaluation of patient's response to treatment, examination of patient, obtaining history from patient or surrogate, ordering and performing treatments and interventions, ordering and review of laboratory studies, ordering and review of radiographic studies, pulse oximetry and re-evaluation of patient's condition.  (including critical care time)  Medical Decision Making / ED Course I have reviewed the nursing notes for this encounter and the patient's prior records (if available in EHR or on provided paperwork).    Patient with 1 week of watery diarrhea.  Family reports black stools.  Patient is denies any abdominal pain.  She is endorsing dysuria.  Abdomen is benign.  UA negative.  CBC with leukocytosis.  Lipase and CMP pending.  No recent antibiotic use or exposure to C. Difficile.  Possible viral versus bacterial gastroenteritis.  Patient given IV fluid bolus.  Hemoccult positive.  Hemoglobin with 1 g drop in 9 days.  Will require admission for hemoglobin trending.  Chemistry panel returned notable for renal failure  with hyperkalemia at 7.2.  Temporizing orders placed.  EKG without acute ischemic changes, prolonged QRS or peaked T waves..  Given the patient's infection symptoms with leukocytosis and endorgan damage, code sepsis was initiated and patient was started on empiric antibiotics to include Zosyn and oral Vanc (to cover for possible C.diff).  GI panel was obtained to include C. Difficile.  Will discuss case with medicine for admission.    Final Clinical Impression(s) / ED Diagnoses Final diagnoses:  Acute renal failure, unspecified acute renal failure type ([place])  Hyperkalemia  Sepsis, due to unspecified organism [facility])      This chart was dictated using voice recognition software.  Despite best efforts to proofread,  errors can occur which can change the  documentation meaning.     Fatima Blank, MD 01/30/18 [phone]

## 2018-01-30 NOTE — ED Notes (Signed)
ED Provider at bedside. 

## 2018-01-30 NOTE — ED Triage Notes (Addendum)
Pt hospitalized last week (ED visit). Reports diarrhea x 1 week. Pt brought in by EMS from the [facility]. Family was on scene and will come to ED. Pt alert. VS per EMS 109/74 HR 70 RR 18 97% room air CBG 120

## 2018-01-30 NOTE — ED Notes (Addendum)
K+ 7.0 , given to ED MD. This was a redrawn , due to high K+ at 7.2

## 2018-01-30 NOTE — ED Notes (Signed)
Oral vanc is not available at this facility. MD aware

## 2018-01-30 NOTE — ED Notes (Signed)
Critical lactic acid 4.60 reported to Dr. Maryan Rued

## 2018-01-30 NOTE — Consult Note (Signed)
Reason for Consult: ARF and hyperkalemia Referring Physician:  Tana Coast, MD  Melissa Mccullough is an 84 y.o. female.  HPI: Melissa Mccullough is an 84 yo WF with PMH significant for DM, HTN, and HLD who presented to [facility] with diarrhea for the last week.  She also reports fatigue and malaise as well as some confusion.  While in the ED she was noted to have ARF with BUN/Cr of 95/4.3 (with normal Scr 9 days earlier of 0.82), hyperkalemia of 7.2, and an elevated lactate level.  She has had poor po intake and is also taking lisinopril, hctz, and metformin.  She was admitted to [facility] and we were consulted to help manage her ARF and hyperkalemia.  She did receive IV insulin/D50 and bicarb.  She was started on IVF's and her potassium has already improved to 5.6.  Trend in Creatinine: Creatinine, Ser  Date/Time Value Ref Range Status  01/30/2018 05:53 PM 4.26 (H) 0.44 - 1.00 mg/dL Final  01/30/2018 02:26 PM 4.30 (H) 0.44 - 1.00 mg/dL Final  01/21/2018 06:14 PM 0.82 0.44 - 1.00 mg/dL Final  09/21/2014 08:46 PM 0.80 0.50 - 1.10 mg/dL Final  09/21/2014 04:41 PM 0.83 0.50 - 1.10 mg/dL Final  07/20/2014 05:40 AM 0.60 0.50 - 1.10 mg/dL Final  07/19/2014 05:20 AM 0.83 0.50 - 1.10 mg/dL Final  07/18/2014 03:32 AM 1.15 (H) 0.50 - 1.10 mg/dL Final  07/17/2014 05:44 PM 1.46 (H) 0.50 - 1.10 mg/dL Final  05/13/2014 05:04 AM 0.65 0.50 - 1.10 mg/dL Final  05/12/2014 07:20 AM 0.74 0.50 - 1.10 mg/dL Final  05/11/2014 04:43 AM 1.44 (H) 0.50 - 1.10 mg/dL Final  05/11/2014 04:43 AM 1.38 (H) 0.50 - 1.10 mg/dL Final  05/10/2014 05:55 PM 1.41 (H) 0.50 - 1.10 mg/dL Final  05/22/2012 06:40 AM 0.53 0.50 - 1.10 mg/dL Final  05/21/2012 06:50 AM 0.58 0.50 - 1.10 mg/dL Final  05/20/2012 05:55 AM 0.75 0.50 - 1.10 mg/dL Final  05/19/2012 06:11 PM 0.81 0.50 - 1.10 mg/dL Final  05/19/2012 11:28 AM 0.94 0.50 - 1.10 mg/dL Final    PMH:   Past Medical History:  Diagnosis Date  . Anemia   . Glaucoma   . Hyperglycemia  04/2014  . Hyperlipidemia   . Hypertension   . Hypothyroidism   . Neuropathy   . Type II or unspecified type diabetes mellitus without mention of complication, not stated as uncontrolled     PSH:   Past Surgical History:  Procedure Laterality Date  . BREAST SURGERY    . CHOLECYSTECTOMY    . THYROIDECTOMY      Allergies:  Allergies  Allergen Reactions  . Codeine Rash  . Macrobid [Nitrofurantoin Macrocrystal] Rash    Medications:   Prior to Admission medications   Medication Sig Start Date End Date Taking? Authorizing Provider  ALPRAZolam Duanne Moron) 0.5 MG tablet Take one tablet by mouth every night at bedtime Patient not taking: Reported on 01/22/2018 07/24/14   Mariea Clonts, Tiffany L, DO  amLODipine (NORVASC) 5 MG tablet Take 5 mg by mouth daily.    [provider]  Ascorbic Acid (VITAMIN C) 1000 MG tablet Take 1,000 mg by mouth daily.    [provider]  Cholecalciferol (VITAMIN D PO) Take 1,000 Units by mouth daily.    [provider]  cycloSPORINE (RESTASIS) 0.05 % ophthalmic emulsion Place 1 drop into both eyes 2 (two) times daily.    [provider]  dorzolamide-timolol (COSOPT) 22.3-6.8 MG/ML ophthalmic solution Place 1 drop  into both eyes 2 (two) times daily.  03/21/14   [provider]  ezetimibe (ZETIA) 10 MG tablet Take 10 mg by mouth daily.    [provider]  gabapentin (NEURONTIN) 600 MG tablet Take 600 mg by mouth 4 (four) times daily.    [provider]  glipiZIDE (GLUCOTROL XL) 2.5 MG 24 hr tablet Take 2.5 mg by mouth daily with breakfast.    [provider]  hydrochlorothiazide (HYDRODIURIL) 25 MG tablet Take 25 mg by mouth daily.  03/17/14   [provider]  latanoprost (XALATAN) 0.005 % ophthalmic solution Place 1 drop into both eyes at bedtime.    [provider]  levothyroxine (SYNTHROID, LEVOTHROID) 25 MCG tablet Take 25 mcg by mouth daily before breakfast.    [provider]   lisinopril (PRINIVIL,ZESTRIL) 10 MG tablet Take 1 tablet (10 mg total) by mouth daily. Patient taking differently: Take 20 mg by mouth daily.  05/13/14   Rai, Ripudeep K, MD  LORazepam (ATIVAN) 0.5 MG tablet Take 0.5 mg by mouth 2 (two) times daily.     [provider]  metFORMIN (GLUCOPHAGE) 1000 MG tablet Take 1,000 mg by mouth 2 (two) times daily with a meal.    [provider]  Multiple Vitamins-Minerals (ICAPS AREDS 2 PO) Take 1 capsule by mouth daily.    [provider]  omega-3 acid ethyl esters (LOVAZA) 1 G capsule Take 1 g by mouth 2 (two) times daily.     [provider]  pioglitazone (ACTOS) 15 MG tablet Take 15 mg by mouth daily.    [provider]  vitamin E 400 UNIT capsule Take 400 Units by mouth daily.    [provider]    Inpatient medications: . vancomycin  125 mg Oral Q6H    Discontinued Meds:   Medications Discontinued During This Encounter  Medication Reason  . metroNIDAZOLE (FLAGYL) IVPB 500 mg   . 0.9 %  sodium chloride infusion     Social History:  reports that she has quit smoking. she has never used smokeless tobacco. She reports that she does not drink alcohol or use drugs.  Family History:   Family History  Problem Relation Age of Onset  . Cancer Other   . Heart attack Other   . Heart disease Mother     Pertinent items are noted in HPI. Weight change:   Intake/Output Summary (Last 24 hours) at 01/30/2018 2137 Last data filed at 01/30/2018 1939 Gross per 24 hour  Intake 4000 ml  Output -  Net 4000 ml   BP 113/66 (BP Location: Right Arm)   Pulse 99   Temp 97.8 F (36.6 C) (Oral)   Resp 18   Ht 5' (1.524 m)   Wt 54.4 kg (120 lb)   SpO2 98%   BMI 23.44 kg/m  Vitals:   01/30/18 1900 01/30/18 1930 01/30/18 1940 01/30/18 2120  BP: 139/79 (!) 122/50  113/66  Pulse: (!) 105 (!) 104  99  Resp: (!) 27 19  18   Temp:   97.6 F (36.4 C) 97.8 F (36.6 C)  TempSrc:   Oral Oral  SpO2: 95% 95%   98%  Weight:      Height:         General appearance: fatigued, mild distress and slowed mentation Head: Normocephalic, without obvious abnormality, atraumatic Throat: dry oral mucosa Resp: clear to auscultation bilaterally Cardio: tachycardic at 111, no rub GI: soft, non-tender; bowel sounds normal; no masses,  no organomegaly Extremities: extremities normal, atraumatic, no cyanosis or edema  Labs: Basic Metabolic Panel: Recent Labs  Lab 01/30/18 1426 01/30/18 1534 01/30/18 1753  NA 134*  --  135  K 7.2* 7.0* 5.6*  CL 101  --  106  CO2 21*  --  17*  GLUCOSE 92  --  79  BUN 95*  --  88*  CREATININE 4.30*  --  4.26*  ALBUMIN 3.1*  --   --   CALCIUM 10.0  --  9.3   Liver Function Tests: Recent Labs  Lab 01/30/18 1426  AST 29  ALT 26  ALKPHOS 64  BILITOT 0.6  PROT 7.2  ALBUMIN 3.1*   Recent Labs  Lab 01/30/18 1426  LIPASE 48   No results for input(s): AMMONIA in the last 168 hours. CBC: Recent Labs  Lab 01/30/18 1426  WBC 14.5*  NEUTROABS 10.3*  HGB 11.9*  HCT 36.4  MCV 98.6  PLT 246   PT/INR: @LABRCNTIP (inr:5) Cardiac Enzymes: )No results for input(s): CKTOTAL, CKMB, CKMBINDEX, TROPONINI in the last 168 hours. CBG: No results for input(s): GLUCAP in the last 168 hours.  Iron Studies: No results for input(s): IRON, TIBC, TRANSFERRIN, FERRITIN in the last 168 hours.  Xrays/Other Studies: Ct Abdomen Pelvis Wo Contrast  Result Date: 01/30/2018 CLINICAL DATA:  Abdominal pain.  Suspect colitis. EXAM: CT ABDOMEN AND PELVIS WITHOUT CONTRAST TECHNIQUE: Multidetector CT imaging of the abdomen and pelvis was performed following the standard protocol without IV contrast. COMPARISON:  09/21/2014 FINDINGS: Lower chest: Moderate cardiac enlargement. Aortic atherosclerosis. Calcification in the RCA and left circumflex coronary arteries noted. Small bilateral pleural effusions identified within the lung bases. Hepatobiliary: The liver has a nodular contour and there  is hypertrophy of the lateral segment of left lobe of liver. No focal liver abnormality identified. Previous cholecystectomy. The proximal common bile duct measures up to 1.2 cm. Pancreas: Unremarkable. No pancreatic ductal dilatation or surrounding inflammatory changes. Spleen: Normal in size without focal abnormality. Adrenals/Urinary Tract: Normal adrenal glands. Chronic bilateral and symmetric perinephric soft tissue stranding compatible with prior insult. No mass or hydronephrosis. Urinary bladder appears normal. Stomach/Bowel: No abnormal distension of the stomach. The small bowel loops have a normal caliber. The appendix is visualized and appears normal. There is no pathologic dilatation of the colon. No significant large bowel wall thickening or inflammation. Vascular/Lymphatic: Aortic atherosclerosis. No aneurysm. Scattered small upper abdominal lymph nodes identified but no adenopathy. No pelvic or inguinal adenopathy. Reproductive: Uterus and bilateral adnexa are unremarkable. Other: No significant free fluid or fluid collections identified within the abdomen or pelvis. Musculoskeletal: Previous left femur ORIF. The bones appear diffusely osteopenic. There is degenerative disc disease identified throughout the lower thoracic and lumbar spine. IMPRESSION: 1. Nodular liver compatible with cirrhosis. 2. Previous cholecystectomy with increase caliber of the CBD. No choledocholithiasis noted. 3. Cardiac enlargement, aortic atherosclerosis and coronary artery calcifications. Aortic Atherosclerosis (ICD10-I70.0). 4. Small pleural effusions. Electronically Signed   By: Kerby Moors M.D.   On: 01/30/2018 17:38     Assessment/Plan: 1.  ARF- in setting of volume depletion and concomitant ACE inhibition.  Agree with holding ACE/HCTZ and ivf's.  I will change to isotonic bicarb due to her metabolic acidosis and hyperkalemia.  Continue to follow.  No indication for dialysis at this time 2. Hyperkalemia- due to  #1.  Improving with IVF's and medical management 3. Lactic acidosis- likely due to ARF and metformin.  Started on bicarb and will follow 4. Diarrhea- concerning for  C. Diff.  Treatment per primary svc 5. HTN- stable 6. Hypothermia- Temp down to 97.5. Started empiric abx in ED. Cultures pending 7. Nodular liver concerning for cirrhosis- w/u per primary 8. CAD seen on [place] 01/30/2018, 9:37 PM

## 2018-01-30 NOTE — ED Notes (Signed)
Critical Lactic Acid 3.44 reported to Dr. Leonette Monarch

## 2018-01-30 NOTE — Progress Notes (Signed)
Pharmacy Antibiotic Note  Melissa Mccullough is a 84 y.o. female admitted on 01/30/2018 with intra-abdominal infection.  Pharmacy has been consulted for zosyn dosing.  WBC 14.5. Afebrile. Renal function is elevated (Scr 4.3, CrCl 6 mL/min). Last Scr was 0.82 on 01/21/2018.   Plan: Zosyn 3.375 g every 12 hours  Would consider discontinue metronidazole since already have anaerobic coverage with zosyn therapy Monitor renal function, cx results, clinical pic  Height: 5' (152.4 cm) Weight: 120 lb (54.4 kg) IBW/kg (Calculated) : 45.5  Temp (24hrs), Avg:97.6 F (36.4 C), Min:97.6 F (36.4 C), Max:97.6 F (36.4 C)  Recent Labs  Lab 01/30/18 1426  WBC 14.5*  CREATININE 4.30*    Estimated Creatinine Clearance: 6.6 mL/min (A) (by C-G formula based on SCr of 4.3 mg/dL (H)).    Allergies  Allergen Reactions  . Codeine Rash  . Macrobid [Nitrofurantoin Macrocrystal] Rash    Antimicrobials this admission: Zosyn 2/16 >>  Metronidazole x1  Dose adjustments this admission: N/A  Microbiology results: 2/16 BCx: sent 2/16 GI panel: sent  2/16 C diff PCR: sent  Thank you for allowing pharmacy to be a part of this patient's care.  Doylene Canard, PharmD Clinical Pharmacist  Pager: [phone] Clinical Phone for 01/30/2018 until 3:30pm: [phone] If after 3:30pm, please call main pharmacy at [phone] 01/30/2018 4:17 PM

## 2018-01-30 NOTE — ED Notes (Signed)
Pt on cardiac monitor and auto VS 

## 2018-01-30 NOTE — ED Notes (Signed)
CT called as pt ready for CT scan now.

## 2018-01-31 ENCOUNTER — Encounter (HOSPITAL_COMMUNITY): Payer: Self-pay | Admitting: *Deleted

## 2018-01-31 ENCOUNTER — Other Ambulatory Visit: Payer: Self-pay

## 2018-01-31 LAB — COMPREHENSIVE METABOLIC PANEL
ALT: 19 U/L (ref 14–54)
AST: 31 U/L (ref 15–41)
BUN: 85 mg/dL — ABNORMAL HIGH (ref 6–20)
Calcium: 8.5 mg/dL — ABNORMAL LOW (ref 8.9–10.3)
Chloride: 103 mmol/L (ref 101–111)
Creatinine, Ser: 4.25 mg/dL — ABNORMAL HIGH (ref 0.44–1.00)
GFR calc non Af Amer: 9 mL/min — ABNORMAL LOW (ref >60)
Total Bilirubin: 0.7 mg/dL (ref 0.3–1.2)
Total Protein: 5.6 g/dL — ABNORMAL LOW (ref 6.5–8.1)

## 2018-01-31 LAB — GASTROINTESTINAL PANEL BY PCR, STOOL (REPLACES STOOL CULTURE)

## 2018-01-31 LAB — COMPREHENSIVE METABOLIC PANEL WITH GFR
Albumin: 2.2 g/dL — ABNORMAL LOW (ref 3.5–5.0)
Alkaline Phosphatase: 48 U/L (ref 38–126)
Anion gap: 12 (ref 5–15)
CO2: 20 mmol/L — ABNORMAL LOW (ref 22–32)
GFR calc Af Amer: 10 mL/min — ABNORMAL LOW (ref >60)
Glucose, Bld: 48 mg/dL — ABNORMAL LOW (ref 65–99)
Potassium: 6.3 mmol/L (ref 3.5–5.1)
Sodium: 135 mmol/L (ref 135–145)

## 2018-01-31 LAB — GLUCOSE, CAPILLARY
GLUCOSE-CAPILLARY: 42 mg/dL — AB (ref 65–99)
GLUCOSE-CAPILLARY: 143 mg/dL — AB (ref 65–99)
GLUCOSE-CAPILLARY: 64 mg/dL — AB (ref 65–99)
GLUCOSE-CAPILLARY: 81 mg/dL (ref 65–99)
Glucose-Capillary: 51 mg/dL — ABNORMAL LOW (ref 65–99)
Glucose-Capillary: 74 mg/dL (ref 65–99)
Glucose-Capillary: 113 mg/dL — ABNORMAL HIGH (ref 65–99)
Glucose-Capillary: 116 mg/dL — ABNORMAL HIGH (ref 65–99)

## 2018-01-31 LAB — BASIC METABOLIC PANEL
BUN: 84 mg/dL — ABNORMAL HIGH (ref 6–20)
CO2: 21 mmol/L — ABNORMAL LOW (ref 22–32)
Chloride: 100 mmol/L — ABNORMAL LOW (ref 101–111)
GFR calc non Af Amer: 8 mL/min — ABNORMAL LOW (ref >60)
Glucose, Bld: 79 mg/dL (ref 65–99)
Potassium: 6 mmol/L — ABNORMAL HIGH (ref 3.5–5.1)

## 2018-01-31 LAB — MRSA PCR SCREENING: MRSA by PCR: NEGATIVE

## 2018-01-31 LAB — POTASSIUM
POTASSIUM: 3.5 mmol/L (ref 3.5–5.1)
Potassium: 3.8 mmol/L (ref 3.5–5.1)

## 2018-01-31 LAB — LACTIC ACID, PLASMA
Lactic Acid, Venous: 2.6 mmol/L (ref 0.5–1.9)
Lactic Acid, Venous: 2.4 mmol/L (ref 0.5–1.9)

## 2018-01-31 LAB — C DIFFICILE QUICK SCREEN W PCR REFLEX
C Diff antigen: POSITIVE — AB
C Diff toxin: NEGATIVE

## 2018-01-31 LAB — CBC
HCT: 31.3 % — ABNORMAL LOW (ref 36.0–46.0)
Hemoglobin: 10.3 g/dL — ABNORMAL LOW (ref 12.0–15.0)
MCH: 31.9 pg (ref 26.0–34.0)
MCHC: 32.9 g/dL (ref 30.0–36.0)
MCV: 96.9 fL (ref 78.0–100.0)
Platelets: 225 K/uL (ref 150–400)
RBC: 3.23 MIL/uL — ABNORMAL LOW (ref 3.87–5.11)
RDW: 13.9 % (ref 11.5–15.5)
WBC: 12.5 K/uL — ABNORMAL HIGH (ref 4.0–10.5)

## 2018-01-31 LAB — BASIC METABOLIC PANEL WITH GFR
Anion gap: 14 (ref 5–15)
Calcium: 8.6 mg/dL — ABNORMAL LOW (ref 8.9–10.3)
Creatinine, Ser: 4.54 mg/dL — ABNORMAL HIGH (ref 0.44–1.00)
GFR calc Af Amer: 9 mL/min — ABNORMAL LOW (ref >60)
Sodium: 135 mmol/L (ref 135–145)

## 2018-01-31 LAB — PHOSPHORUS: Phosphorus: 4.4 mg/dL (ref 2.5–4.6)

## 2018-01-31 LAB — CLOSTRIDIUM DIFFICILE BY PCR, REFLEXED: CDIFFPCR: POSITIVE — AB

## 2018-01-31 MED ORDER — STERILE WATER FOR INJECTION IV SOLN
150.0000 meq | INTRAVENOUS | Status: DC
  Administered 2018-01-31 – 2018-02-01 (×3): 150 meq via INTRAVENOUS
  Filled 2018-01-31 (×5): qty 850

## 2018-01-31 MED ORDER — FUROSEMIDE 10 MG/ML IJ SOLN
40.0000 mg | Freq: Once | INTRAMUSCULAR | Status: AC
  Administered 2018-01-31: 40 mg via INTRAVENOUS
  Filled 2018-01-31: qty 4

## 2018-01-31 MED ORDER — INSULIN ASPART 100 UNIT/ML IV SOLN
10.0000 [IU] | Freq: Once | INTRAVENOUS | Status: AC
  Administered 2018-01-31: 10 [IU] via INTRAVENOUS

## 2018-01-31 MED ORDER — ALBUTEROL SULFATE (2.5 MG/3ML) 0.083% IN NEBU
10.0000 mg | INHALATION_SOLUTION | Freq: Once | RESPIRATORY_TRACT | Status: AC
  Administered 2018-01-31: 10 mg via RESPIRATORY_TRACT
  Filled 2018-01-31: qty 12

## 2018-01-31 MED ORDER — DEXTROSE 50 % IV SOLN
1.0000 | Freq: Once | INTRAVENOUS | Status: AC
  Administered 2018-01-31: 50 mL via INTRAVENOUS
  Filled 2018-01-31: qty 50

## 2018-01-31 MED ORDER — PATIROMER SORBITEX CALCIUM 8.4 G PO PACK
16.8000 g | PACK | Freq: Every day | ORAL | Status: DC
  Administered 2018-02-01 – 2018-02-06 (×6): 16.8 g via ORAL
  Filled 2018-01-31 (×8): qty 4

## 2018-01-31 NOTE — Progress Notes (Signed)
S: Feels better but admits to having a poor memory and cannot tell me anything about the duration of her diarrhea O:BP (!) 126/53 (BP Location: Right Arm)   Pulse 70   Temp 97.8 F (36.6 C) (Oral)   Resp 16   Ht 5' (1.524 m)   Wt 75.2 kg (165 lb 12.6 oz)   SpO2 99%   BMI 32.38 kg/m   Intake/Output Summary (Last 24 hours) at 01/31/2018 1235 Last data filed at 01/31/2018 0650 Gross per 24 hour  Intake 5141.67 ml  Output -  Net 5141.67 ml   Intake/Output: I/O last 3 completed shifts: In: 5141.7 [I.V.:4141.7; IV Piggyback:1000] Out: -   Intake/Output this shift:  No intake/output data recorded. Weight change:  Gen: NAD CVS: no rub Resp: cta Abd: +BS, soft, NT Ext: no edema  Recent Labs  Lab 01/30/18 1426 01/30/18 1534 01/30/18 1753 01/30/18 2301 01/31/18 0241 01/31/18 0850  NA 134*  --  135 136 135 135  K 7.2* 7.0* 5.6* 6.0* 6.3* 6.0*  CL 101  --  106 106 103 100*  CO2 21*  --  17* 18* 20* 21*  GLUCOSE 92  --  79 57* 48* 79  BUN 95*  --  88* 84* 85* 84*  CREATININE 4.30*  --  4.26* 4.32* 4.25* 4.54*  ALBUMIN 3.1*  --   --   --  2.2*  --   CALCIUM 10.0  --  9.3 8.9 8.5* 8.6*  PHOS  --   --   --   --  4.4  --   AST 29  --   --   --  31  --   ALT 26  --   --   --  19  --    Liver Function Tests: Recent Labs  Lab 01/30/18 1426 01/31/18 0241  AST 29 31  ALT 26 19  ALKPHOS 64 48  BILITOT 0.6 0.7  PROT 7.2 5.6*  ALBUMIN 3.1* 2.2*   Recent Labs  Lab 01/30/18 1426  LIPASE 48   No results for input(s): AMMONIA in the last 168 hours. CBC: Recent Labs  Lab 01/30/18 1426 01/31/18 0241  WBC 14.5* 12.5*  NEUTROABS 10.3*  --   HGB 11.9* 10.3*  HCT 36.4 31.3*  MCV 98.6 96.9  PLT 246 225   Cardiac Enzymes: No results for input(s): CKTOTAL, CKMB, CKMBINDEX, TROPONINI in the last 168 hours. CBG: Recent Labs  Lab 01/31/18 0440 01/31/18 0627 01/31/18 0811 01/31/18 1024 01/31/18 1145  GLUCAP 42* 143* 51* 64* 74    Iron Studies: No results for  input(s): IRON, TIBC, TRANSFERRIN, FERRITIN in the last 72 hours. Studies/Results: Ct Abdomen Pelvis Wo Contrast  Result Date: 01/30/2018 CLINICAL DATA:  Abdominal pain.  Suspect colitis. EXAM: CT ABDOMEN AND PELVIS WITHOUT CONTRAST TECHNIQUE: Multidetector CT imaging of the abdomen and pelvis was performed following the standard protocol without IV contrast. COMPARISON:  09/21/2014 FINDINGS: Lower chest: Moderate cardiac enlargement. Aortic atherosclerosis. Calcification in the RCA and left circumflex coronary arteries noted. Small bilateral pleural effusions identified within the lung bases. Hepatobiliary: The liver has a nodular contour and there is hypertrophy of the lateral segment of left lobe of liver. No focal liver abnormality identified. Previous cholecystectomy. The proximal common bile duct measures up to 1.2 cm. Pancreas: Unremarkable. No pancreatic ductal dilatation or surrounding inflammatory changes. Spleen: Normal in size without focal abnormality. Adrenals/Urinary Tract: Normal adrenal glands. Chronic bilateral and symmetric perinephric soft tissue stranding compatible with prior insult. No  mass or hydronephrosis. Urinary bladder appears normal. Stomach/Bowel: No abnormal distension of the stomach. The small bowel loops have a normal caliber. The appendix is visualized and appears normal. There is no pathologic dilatation of the colon. No significant large bowel wall thickening or inflammation. Vascular/Lymphatic: Aortic atherosclerosis. No aneurysm. Scattered small upper abdominal lymph nodes identified but no adenopathy. No pelvic or inguinal adenopathy. Reproductive: Uterus and bilateral adnexa are unremarkable. Other: No significant free fluid or fluid collections identified within the abdomen or pelvis. Musculoskeletal: Previous left femur ORIF. The bones appear diffusely osteopenic. There is degenerative disc disease identified throughout the lower thoracic and lumbar spine. IMPRESSION:  1. Nodular liver compatible with cirrhosis. 2. Previous cholecystectomy with increase caliber of the CBD. No choledocholithiasis noted. 3. Cardiac enlargement, aortic atherosclerosis and coronary artery calcifications. Aortic Atherosclerosis (ICD10-I70.0). 4. Small pleural effusions. Electronically Signed   By: Kerby Moors M.D.   On: 01/30/2018 17:38   . cholecalciferol  1,000 Units Oral Daily  . cycloSPORINE  1 drop Both Eyes BID  . dorzolamide-timolol  1 drop Both Eyes BID  . ezetimibe  10 mg Oral Daily  . gabapentin  300 mg Oral BID  . heparin  5,000 Units Subcutaneous Q8H  . insulin aspart  0-9 Units Subcutaneous TID WC  . latanoprost  1 drop Both Eyes QHS  . levothyroxine  25 mcg Oral QAC breakfast  . LORazepam  0.5 mg Oral BID  . omega-3 acid ethyl esters  1 g Oral BID  . sodium chloride flush  3 mL Intravenous Q12H  . vancomycin  125 mg Oral Q6H  . vitamin C  1,000 mg Oral Daily  . vitamin E  400 Units Oral Daily    BMET    Component Value Date/Time   NA 135 01/31/2018 0850   K 6.0 (H) 01/31/2018 0850   CL 100 (L) 01/31/2018 0850   CO2 21 (L) 01/31/2018 0850   GLUCOSE 79 01/31/2018 0850   BUN 84 (H) 01/31/2018 0850   CREATININE 4.54 (H) 01/31/2018 0850   CREATININE 0.67 06/09/2014 1050   CALCIUM 8.6 (L) 01/31/2018 0850   GFRNONAA 8 (L) 01/31/2018 0850   GFRNONAA 82 06/09/2014 1050   GFRAA 9 (L) 01/31/2018 0850   GFRAA >89 06/09/2014 1050   CBC    Component Value Date/Time   WBC 12.5 (H) 01/31/2018 0241   RBC 3.23 (L) 01/31/2018 0241   HGB 10.3 (L) 01/31/2018 0241   HCT 31.3 (L) 01/31/2018 0241   PLT 225 01/31/2018 0241   MCV 96.9 01/31/2018 0241   MCH 31.9 01/31/2018 0241   MCHC 32.9 01/31/2018 0241   RDW 13.9 01/31/2018 0241   LYMPHSABS 2.6 01/30/2018 1426   MONOABS 1.4 (H) 01/30/2018 1426   EOSABS 0.2 01/30/2018 1426   BASOSABS 0.0 01/30/2018 1426     Assessment/Plan: 1.  ARF- in setting of volume depletion and concomitant ACE inhibition.  Agree  with holding ACE/HCTZ and ivf's.  I will change to isotonic bicarb due to her metabolic acidosis and hyperkalemia.  Continue to follow.  No indication for dialysis at this time 2. Hyperkalemia- due to #1.  Improving with IVF's and medical management but bumped again today.  Will dose lasix x 1 and repeat.  Will also place foley cath due to urinary retention. 3. Lactic acidosis- likely due to ARF and metformin.  Started on bicarb and will follow 4. Diarrhea- C. Diff +.  Treatment per primary svc 5. Urinary retention- place foley and follow UOP  6. HTN- stable 7. Hypothermia- Temp down to 97.5. Started empiric abx in ED. Cultures pending 8. Nodular liver concerning for cirrhosis- w/u per primary 9. CAD seen on CT 10. Memory deficit- ? Dementia at baseline or delirium from acute illness.   Donetta Potts, MD [employer] [phone]

## 2018-01-31 NOTE — Progress Notes (Signed)
Patient c/s reads 42. Will administrer 2 amps of 50% dextrose and give the 10 units of Regular insulin as ordered.by Dr. Herbert Moors. MD is aware of K+6.3 result. Will continue to monitor closely.

## 2018-01-31 NOTE — Progress Notes (Signed)
PROGRESS NOTE    Melissa Mccullough  TWS:[id number] DOB: 05/28/1928 DOA: 01/30/2018 PCP: System, Pcp Not In     Brief Narrative:  Melissa Mccullough is a  84 y.o. female with medical history significant of type 2 diabetes on oral medication, hypertension, glaucoma, hyperlipidemia, hypothyroidism who comes in with diarrhea for approximately 1 week.  Per report of the patient and her family she began to have nausea, vomiting, and diarrhea approximately 8 days ago.  She was seen in the ER where she was noted to be dehydrated and treated symptomatically.  Per the family, her nausea and vomiting resolved.  Her diarrhea continued however.  She began to have multiple bowel movements a day.  There was never any blood frank blood in the bowel movements.  On discussion with the patient, she reports that she had crampy abdominal pain that was intermittent but wax and wane throughout the day.  She began to have innumerable bowel movements and family brought her back to the ED at [facility].  In the ED, she was noted to be in renal failure with Cr 4.3 and potassium 7. She was given IV zosyn and oral vanco for concern of infectious colitis. Nephrology was consulted and patient started on IV sodium bicarb.   Assessment & Plan:   Principal Problem:   Acute renal insufficiency Active Problems:   Essential hypertension   Hyperlipidemia   Hypothyroidism   Renal failure   Sepsis ([place])   Diarrhea   Severe sepsis secondary to C Diff  -C Diff positive PCR -Continue PO vanco -IVF  AKI -In setting of volume loss, lisinopril, HCTZ -Nephrology following -IVF  Hyperkalemia -Given albuterol, calcium gluconate, insulin/dextrose, lasix. K remains elevated at 6.0 this morning.  -Trend potassium levels  Lactic acidosis -In setting of acute renal failure, C Diff infection/sepsis, metformin -On IV bicarb  -Trend   Type 2 DM -Hold glipizide, metformin, glitazone -SSI  -Hypoglycemic this morning,  likely in setting of poor PO intake and total 20u novolog to treat hyperkalemia  HTN -Hold norvasc, lisinopril, HCTZ for now  Hypothyroidism -Continue synthroid  HLD -Continue zetia   Cirrhosis -Found on CT abdomen   DVT prophylaxis: subq hep  Code Status: DNR Family Communication: Spoke with daughter [phone] over the phone. Could not reach other daughter [phone] despite 2 calls  Disposition Plan: Pending improvement, remain in stepdown unit   Consultants:   Nephrology  Procedures:   None   Antimicrobials:  Anti-infectives (From admission, onward)   Start     Dose/Rate Route Frequency Ordered Stop   01/31/18 0500  piperacillin-tazobactam (ZOSYN) IVPB 3.375 g  Status:  Discontinued     3.375 g 12.5 mL/hr over 240 Minutes Intravenous Every 12 hours 01/30/18 1623 01/31/18 0419   01/30/18 1800  vancomycin (VANCOCIN) 50 mg/mL oral solution 125 mg     125 mg Oral Every 6 hours 01/30/18 1641     01/30/18 1615  piperacillin-tazobactam (ZOSYN) IVPB 3.375 g     3.375 g 100 mL/hr over 30 Minutes Intravenous  Once 01/30/18 1605 01/30/18 1803   01/30/18 1615  metroNIDAZOLE (FLAGYL) IVPB 500 mg  Status:  Discontinued     500 mg 100 mL/hr over 60 Minutes Intravenous  Once 01/30/18 1606 01/30/18 1712       Subjective: Patient lethargic this morning, blood sugar 51 and improved with orange juice.   Objective: Vitals:   01/31/18 0620 01/31/18 0800 01/31/18 0904 01/31/18 1100  BP:  (!) 113/52  Marland Kitchen)  126/53  Pulse: 80 74  70  Resp: 12 14  16   Temp:  97.8 F (36.6 C)  97.8 F (36.6 C)  TempSrc:  Oral  Oral  SpO2: 99% 99% 99% 99%  Weight: 75.2 kg (165 lb 12.6 oz)     Height:        Intake/Output Summary (Last 24 hours) at 01/31/2018 1304 Last data filed at 01/31/2018 1247 Gross per 24 hour  Intake 5141.67 ml  Output 175 ml  Net 4966.67 ml   Filed Weights   01/30/18 1222 01/31/18 0620  Weight: 54.4 kg (120 lb) 75.2 kg (165 lb 12.6 oz)    Examination:  General  exam: Appears calm and comfortable, lethargic, mucus membranes dry  Respiratory system: Clear to auscultation. Respiratory effort normal. Cardiovascular system: S1 & S2 heard, RRR. No JVD, murmurs, rubs, gallops or clicks. No pedal edema. Gastrointestinal system: Abdomen is nondistended, soft and nontender. No organomegaly or masses felt. Normal bowel sounds heard. Central nervous system: Alert and oriented. Slow to answer questions Extremities: Symmetric  Skin: No rashes, lesions or ulcers Psychiatry: Judgement and insight appear stable   Data Reviewed: I have personally reviewed following labs and imaging studies  CBC: Recent Labs  Lab 01/30/18 1426 01/31/18 0241  WBC 14.5* 12.5*  NEUTROABS 10.3*  --   HGB 11.9* 10.3*  HCT 36.4 31.3*  MCV 98.6 96.9  PLT 246 578   Basic Metabolic Panel: Recent Labs  Lab 01/30/18 1426 01/30/18 1534 01/30/18 1753 01/30/18 2301 01/31/18 0241 01/31/18 0850  NA 134*  --  135 136 135 135  K 7.2* 7.0* 5.6* 6.0* 6.3* 6.0*  CL 101  --  106 106 103 100*  CO2 21*  --  17* 18* 20* 21*  GLUCOSE 92  --  79 57* 48* 79  BUN 95*  --  88* 84* 85* 84*  CREATININE 4.30*  --  4.26* 4.32* 4.25* 4.54*  CALCIUM 10.0  --  9.3 8.9 8.5* 8.6*  PHOS  --   --   --   --  4.4  --    GFR: Estimated Creatinine Clearance: 7.9 mL/min (A) (by C-G formula based on SCr of 4.54 mg/dL (H)). Liver Function Tests: Recent Labs  Lab 01/30/18 1426 01/31/18 0241  AST 29 31  ALT 26 19  ALKPHOS 64 48  BILITOT 0.6 0.7  PROT 7.2 5.6*  ALBUMIN 3.1* 2.2*   Recent Labs  Lab 01/30/18 1426  LIPASE 48   No results for input(s): AMMONIA in the last 168 hours. Coagulation Profile: No results for input(s): INR, PROTIME in the last 168 hours. Cardiac Enzymes: No results for input(s): CKTOTAL, CKMB, CKMBINDEX, TROPONINI in the last 168 hours. BNP (last 3 results) No results for input(s): PROBNP in the last 8760 hours. HbA1C: No results for input(s): HGBA1C in the last 72  hours. CBG: Recent Labs  Lab 01/31/18 0440 01/31/18 0627 01/31/18 0811 01/31/18 1024 01/31/18 1145  GLUCAP 42* 143* 51* 64* 74   Lipid Profile: No results for input(s): CHOL, HDL, LDLCALC, TRIG, CHOLHDL, LDLDIRECT in the last 72 hours. Thyroid Function Tests: No results for input(s): TSH, T4TOTAL, FREET4, T3FREE, THYROIDAB in the last 72 hours. Anemia Panel: No results for input(s): VITAMINB12, FOLATE, FERRITIN, TIBC, IRON, RETICCTPCT in the last 72 hours. Sepsis Labs: Recent Labs  Lab 01/30/18 1646 01/30/18 1828  LATICACIDVEN 3.44* 4.60*    Recent Results (from the past 240 hour(s))  Blood Culture (routine x 2)     Status:  None (Preliminary result)   Collection Time: 01/30/18  4:32 PM  Result Value Ref Range Status   Specimen Description BLOOD RIGHT WRIST  Final   Special Requests   Final    BOTTLES DRAWN AEROBIC AND ANAEROBIC Blood Culture adequate volume Performed at [facility], [address] [place], [place] [address]    Culture PENDING  Incomplete   Report Status PENDING  Incomplete  Blood Culture (routine x 2)     Status: None (Preliminary result)   Collection Time: 01/30/18  4:36 PM  Result Value Ref Range Status   Specimen Description BLOOD RIGHT HAND  Final   Special Requests   Final    BOTTLES DRAWN AEROBIC AND ANAEROBIC Blood Culture adequate volume Performed at [facility], [address] [place], [place] [address]    Culture PENDING  Incomplete   Report Status PENDING  Incomplete  MRSA PCR Screening     Status: None   Collection Time: 01/30/18 10:16 PM  Result Value Ref Range Status   MRSA by PCR NEGATIVE NEGATIVE Final    Comment:        The GeneXpert MRSA Assay (FDA approved for NASAL specimens only), is one component of a comprehensive MRSA colonization surveillance program. It is not intended to diagnose MRSA infection nor to guide or monitor treatment for MRSA infections. Performed at [facility],  [place] [address] [place], [place] [address]   C difficile quick scan w PCR reflex     Status: Abnormal   Collection Time: 01/31/18  9:36 AM  Result Value Ref Range Status   C Diff antigen POSITIVE (A) NEGATIVE Final   C Diff toxin NEGATIVE NEGATIVE Final   C Diff interpretation Results are indeterminate. See PCR results.  Final    Comment: Performed at [facility], [place] [address] [place], [place] [address]  C. Diff by PCR, Reflexed     Status: Abnormal   Collection Time: 01/31/18  9:36 AM  Result Value Ref Range Status   Toxigenic C. Difficile by PCR POSITIVE (A) NEGATIVE Final    Comment: Positive for toxigenic C. difficile with little to no toxin production. Only treat if clinical presentation suggests symptomatic illness. Performed at [facility], [place] [address], [place], [place] [address]        Radiology Studies: Ct Abdomen Pelvis Wo Contrast  Result Date: 01/30/2018 CLINICAL DATA:  Abdominal pain.  Suspect colitis. EXAM: CT ABDOMEN AND PELVIS WITHOUT CONTRAST TECHNIQUE: Multidetector CT imaging of the abdomen and pelvis was performed following the standard protocol without IV contrast. COMPARISON:  09/21/2014 FINDINGS: Lower chest: Moderate cardiac enlargement. Aortic atherosclerosis. Calcification in the RCA and left circumflex coronary arteries noted. Small bilateral pleural effusions identified within the lung bases. Hepatobiliary: The liver has a nodular contour and there is hypertrophy of the lateral segment of left lobe of liver. No focal liver abnormality identified. Previous cholecystectomy. The proximal common bile duct measures up to 1.2 cm. Pancreas: Unremarkable. No pancreatic ductal dilatation or surrounding inflammatory changes. Spleen: Normal in size without focal abnormality. Adrenals/Urinary Tract: Normal adrenal glands. Chronic bilateral and symmetric perinephric soft tissue stranding compatible with prior insult. No mass or hydronephrosis. Urinary bladder  appears normal. Stomach/Bowel: No abnormal distension of the stomach. The small bowel loops have a normal caliber. The appendix is visualized and appears normal. There is no pathologic dilatation of the colon. No significant large bowel wall thickening or inflammation. Vascular/Lymphatic: Aortic  atherosclerosis. No aneurysm. Scattered small upper abdominal lymph nodes identified but no adenopathy. No pelvic or inguinal adenopathy. Reproductive: Uterus and bilateral adnexa are unremarkable. Other: No significant free fluid or fluid collections identified within the abdomen or pelvis. Musculoskeletal: Previous left femur ORIF. The bones appear diffusely osteopenic. There is degenerative disc disease identified throughout the lower thoracic and lumbar spine. IMPRESSION: 1. Nodular liver compatible with cirrhosis. 2. Previous cholecystectomy with increase caliber of the CBD. No choledocholithiasis noted. 3. Cardiac enlargement, aortic atherosclerosis and coronary artery calcifications. Aortic Atherosclerosis (ICD10-I70.0). 4. Small pleural effusions. Electronically Signed   By: Kerby Moors M.D.   On: 01/30/2018 17:38      Scheduled Meds: . cholecalciferol  1,000 Units Oral Daily  . cycloSPORINE  1 drop Both Eyes BID  . dorzolamide-timolol  1 drop Both Eyes BID  . ezetimibe  10 mg Oral Daily  . gabapentin  300 mg Oral BID  . heparin  5,000 Units Subcutaneous Q8H  . insulin aspart  0-9 Units Subcutaneous TID WC  . latanoprost  1 drop Both Eyes QHS  . levothyroxine  25 mcg Oral QAC breakfast  . LORazepam  0.5 mg Oral BID  . omega-3 acid ethyl esters  1 g Oral BID  . patiromer  16.8 g Oral Daily  . sodium chloride flush  3 mL Intravenous Q12H  . vancomycin  125 mg Oral Q6H  . vitamin C  1,000 mg Oral Daily  . vitamin E  400 Units Oral Daily   Continuous Infusions: .  sodium bicarbonate (isotonic) infusion in sterile water 150 mEq (01/31/18 1247)     LOS: 1 day    Time spent: 40 minutes    Dessa Phi, DO [facility] [email or web address] Password [email or web address] 01/31/2018, 1:03 PM

## 2018-01-31 NOTE — Progress Notes (Signed)
Bladder scan reads 226 in bladder. Patient states she doesn't feel uncomfortable. No urine output. MD notified.

## 2018-02-01 LAB — HEPATIC FUNCTION PANEL
ALT: 18 U/L (ref 14–54)
AST: 29 U/L (ref 15–41)
Albumin: 1.9 g/dL — ABNORMAL LOW (ref 3.5–5.0)
Alkaline Phosphatase: 46 U/L (ref 38–126)
BILIRUBIN DIRECT: 0.1 mg/dL (ref 0.1–0.5)
BILIRUBIN INDIRECT: 0.6 mg/dL (ref 0.3–0.9)
BILIRUBIN TOTAL: 0.7 mg/dL (ref 0.3–1.2)
Total Protein: 4.8 g/dL — ABNORMAL LOW (ref 6.5–8.1)

## 2018-02-01 LAB — CBC
HEMATOCRIT: 30 % — AB (ref 36.0–46.0)
Hemoglobin: 10 g/dL — ABNORMAL LOW (ref 12.0–15.0)
MCH: 31.6 pg (ref 26.0–34.0)
MCHC: 33.3 g/dL (ref 30.0–36.0)
MCV: 94.9 fL (ref 78.0–100.0)
PLATELETS: 207 10*3/uL (ref 150–400)
RBC: 3.16 MIL/uL — ABNORMAL LOW (ref 3.87–5.11)
RDW: 13.5 % (ref 11.5–15.5)
WBC: 10 10*3/uL (ref 4.0–10.5)

## 2018-02-01 LAB — RENAL FUNCTION PANEL
Albumin: 1.9 g/dL — ABNORMAL LOW (ref 3.5–5.0)
Anion gap: 13 (ref 5–15)
BUN: 64 mg/dL — AB (ref 6–20)
CHLORIDE: 74 mmol/L — AB (ref 101–111)
CO2: 46 mmol/L — ABNORMAL HIGH (ref 22–32)
CREATININE: 3.96 mg/dL — AB (ref 0.44–1.00)
Calcium: 6.4 mg/dL — CL (ref 8.9–10.3)
GFR calc Af Amer: 11 mL/min — ABNORMAL LOW (ref >60)
GFR, EST NON AFRICAN AMERICAN: 9 mL/min — AB (ref >60)
GLUCOSE: 72 mg/dL (ref 65–99)
POTASSIUM: 4.8 mmol/L (ref 3.5–5.1)
Phosphorus: 3.2 mg/dL (ref 2.5–4.6)
Sodium: 133 mmol/L — ABNORMAL LOW (ref 135–145)

## 2018-02-01 LAB — GLUCOSE, CAPILLARY
GLUCOSE-CAPILLARY: 117 mg/dL — AB (ref 65–99)
GLUCOSE-CAPILLARY: 104 mg/dL — AB (ref 65–99)
GLUCOSE-CAPILLARY: 172 mg/dL — AB (ref 65–99)
Glucose-Capillary: 63 mg/dL — ABNORMAL LOW (ref 65–99)
Glucose-Capillary: 166 mg/dL — ABNORMAL HIGH (ref 65–99)

## 2018-02-01 MED ORDER — DEXTROSE-NACL 5-0.45 % IV SOLN
INTRAVENOUS | Status: DC
Start: 2018-02-01 — End: 2018-02-02
  Administered 2018-02-01 – 2018-02-02 (×2): via INTRAVENOUS

## 2018-02-01 NOTE — Progress Notes (Signed)
Report called to Ronalee Belts, receiving RN on  [address]. VSS. Transferred to 5w01 via bed with personal belongings. Called patient's family member with new room number.  [facility]

## 2018-02-01 NOTE — Progress Notes (Signed)
Admit: 01/30/2018 LOS: 2  31F with AKI 2/2 hypovolemia (CDI) + ACEi + Diuretic  Subjective:  SCr improving K WNL this AM  No c/o, denies abd pain  02/17 0701 - 02/18 0700 In: 5187.5 [P.O.:905; I.V.:4282.5] Out: 275 [Urine:275]  Filed Weights   01/30/18 1222 01/31/18 0620  Weight: 54.4 kg (120 lb) 75.2 kg (165 lb 12.6 oz)    Scheduled Meds: . cholecalciferol  1,000 Units Oral Daily  . cycloSPORINE  1 drop Both Eyes BID  . dorzolamide-timolol  1 drop Both Eyes BID  . ezetimibe  10 mg Oral Daily  . gabapentin  300 mg Oral BID  . heparin  5,000 Units Subcutaneous Q8H  . insulin aspart  0-9 Units Subcutaneous TID WC  . latanoprost  1 drop Both Eyes QHS  . levothyroxine  25 mcg Oral QAC breakfast  . LORazepam  0.5 mg Oral BID  . omega-3 acid ethyl esters  1 g Oral BID  . patiromer  16.8 g Oral Daily  . sodium chloride flush  3 mL Intravenous Q12H  . vancomycin  125 mg Oral Q6H  . vitamin C  1,000 mg Oral Daily  . vitamin E  400 Units Oral Daily   Continuous Infusions: . dextrose 5 % and 0.45% NaCl 100 mL/hr at 02/01/18 1407   PRN Meds:.acetaminophen **OR** acetaminophen, HYDROcodone-acetaminophen, ondansetron **OR** ondansetron (ZOFRAN) IV  Current Labs: reviewed    Physical Exam:  Blood pressure (!) 161/67, pulse 79, temperature 98.2 F (36.8 C), temperature source Oral, resp. rate (!) 23, height 5' (1.524 m), weight 75.2 kg (165 lb 12.6 oz), SpO2 97 %. RRR CTAB No LEE S/nt/nd, +BS No rashes/lesions EOMI NCAT  A 1. Resolving AKI, normal baseline 2/2 hypovolemia (CDI + thiazide) + ACEi use 2. CDI on Vanco per TRH 3. Hyperkalemia, resolved 4. DM2 holding metformin 5. HTN holding home meds 6. Memory loss  P 1. Cont IVFs for another 24h 2. Daily weights, Daily Renal Panel, Strict I/Os, Avoid nephrotoxins (NSAIDs, judicious IV Contrast) 3. Will follow for another 24h   Pearson Grippe MD 02/01/2018, 3:14 PM  Recent Labs  Lab 01/31/18 0241 01/31/18 0850  01/31/18 1426 01/31/18 1735 02/01/18 0205  NA 135 135  --   --  133*  K 6.3* 6.0* 3.5 3.8 4.8  CL 103 100*  --   --  74*  CO2 20* 21*  --   --  46*  GLUCOSE 48* 79  --   --  72  BUN 85* 84*  --   --  64*  CREATININE 4.25* 4.54*  --   --  3.96*  CALCIUM 8.5* 8.6*  --   --  6.4*  PHOS 4.4  --   --   --  3.2   Recent Labs  Lab 01/30/18 1426 01/31/18 0241 02/01/18 0205  WBC 14.5* 12.5* 10.0  NEUTROABS 10.3*  --   --   HGB 11.9* 10.3* 10.0*  HCT 36.4 31.3* 30.0*  MCV 98.6 96.9 94.9  PLT 246 225 207

## 2018-02-01 NOTE — Progress Notes (Signed)
PROGRESS NOTE    Melissa Mccullough  WFU:[id number] DOB: 1928/02/20 DOA: 01/30/2018 PCP: System, Pcp Not In     Brief Narrative:  Melissa Mccullough is a  84 y.o. female with medical history significant of type 2 diabetes on oral medication, hypertension, glaucoma, hyperlipidemia, hypothyroidism who comes in with diarrhea for approximately 1 week.  Per report of the patient and her family she began to have nausea, vomiting, and diarrhea approximately 8 days ago.  She was seen in the ER where she was noted to be dehydrated and treated symptomatically.  Per the family, her nausea and vomiting resolved.  Her diarrhea continued however.  She began to have multiple bowel movements a day.  There was never any blood frank blood in the bowel movements.  On discussion with the patient, she reports that she had crampy abdominal pain that was intermittent but wax and wane throughout the day.  She began to have innumerable bowel movements and family brought her back to the ED at [facility].  In the ED, she was noted to be in renal failure with Cr 4.3 and potassium 7. She was given IV zosyn and oral vanco for concern of infectious colitis. Nephrology was consulted and patient started on IV sodium bicarb.   Assessment & Plan:   Principal Problem:   Acute renal insufficiency Active Problems:   Essential hypertension   Hyperlipidemia   Hypothyroidism   Renal failure   Sepsis ([place])   Diarrhea   Severe sepsis secondary to C Diff  -C Diff positive PCR -Continue PO vanco -IVF  AKI -In setting of volume loss, lisinopril, HCTZ -Nephrology following -Cr improving -IVF  Hyperkalemia -Resolved   Lactic acidosis -In setting of acute renal failure, C Diff infection/sepsis, metformin -Improved   Type 2 DM -Hold glipizide, metformin, glitazone -Hypoglycemic this morning 63   HTN -Hold norvasc, lisinopril, HCTZ for now  Hypothyroidism -Continue synthroid  HLD -Continue zetia    Cirrhosis -Found on CT abdomen  Memory loss -Per son, patient has had slow progressive memory loss without formal dementia diagnosis    DVT prophylaxis: subq hep  Code Status: DNR Family Communication: Spoke with son over the phone  Disposition Plan: Improving, transfer to telemetry today.    Consultants:   Nephrology  Procedures:   None   Antimicrobials:  Anti-infectives (From admission, onward)   Start     Dose/Rate Route Frequency Ordered Stop   01/31/18 0500  piperacillin-tazobactam (ZOSYN) IVPB 3.375 g  Status:  Discontinued     3.375 g 12.5 mL/hr over 240 Minutes Intravenous Every 12 hours 01/30/18 1623 01/31/18 0419   01/30/18 1800  vancomycin (VANCOCIN) 50 mg/mL oral solution 125 mg     125 mg Oral Every 6 hours 01/30/18 1641     01/30/18 1615  piperacillin-tazobactam (ZOSYN) IVPB 3.375 g     3.375 g 100 mL/hr over 30 Minutes Intravenous  Once 01/30/18 1605 01/30/18 1803   01/30/18 1615  metroNIDAZOLE (FLAGYL) IVPB 500 mg  Status:  Discontinued     500 mg 100 mL/hr over 60 Minutes Intravenous  Once 01/30/18 1606 01/30/18 1712       Subjective: Patient sitting in chair. She appears mildly confused about her surroundings/location, but is easily re-oriented. She does not remember me from yesterday. No new complaints. Per RN, bowel movements have slowed down, now having smears   Objective: Vitals:   01/31/18 2000 02/01/18 0000 02/01/18 0400 02/01/18 0700  BP: (!) 169/73 (!) 85/60  117/82 (!) 157/62  Pulse: 88 83 89 80  Resp: (!) 23 (!) 25 19 18   Temp:    98.1 F (36.7 C)  TempSrc:    Oral  SpO2: (!) 87% (!) 89% 99% 100%  Weight:      Height:        Intake/Output Summary (Last 24 hours) at 02/01/2018 1113 Last data filed at 02/01/2018 0400 Gross per 24 hour  Intake 3947.5 ml  Output 275 ml  Net 3672.5 ml   Filed Weights   01/30/18 1222 01/31/18 0620  Weight: 54.4 kg (120 lb) 75.2 kg (165 lb 12.6 oz)    Examination: General exam: Appears calm  and comfortable  Respiratory system: Clear to auscultation. Respiratory effort normal. Cardiovascular system: S1 & S2 heard, RRR. No JVD, murmurs, rubs, gallops or clicks. No pedal edema. Gastrointestinal system: Abdomen is nondistended, soft and mild TTP, soreness throughout. No organomegaly or masses felt. Normal bowel sounds heard. Central nervous system: Alert and oriented to self. No focal neurological deficits. Extremities: Symmetric 5 x 5 power. Skin: No rashes, lesions or ulcers Psychiatry: +mild dementia/memory loss    Data Reviewed: I have personally reviewed following labs and imaging studies  CBC: Recent Labs  Lab 01/30/18 1426 01/31/18 0241 02/01/18 0205  WBC 14.5* 12.5* 10.0  NEUTROABS 10.3*  --   --   HGB 11.9* 10.3* 10.0*  HCT 36.4 31.3* 30.0*  MCV 98.6 96.9 94.9  PLT 246 225 160   Basic Metabolic Panel: Recent Labs  Lab 01/30/18 1753 01/30/18 2301 01/31/18 0241 01/31/18 0850 01/31/18 1426 01/31/18 1735 02/01/18 0205  NA 135 136 135 135  --   --  133*  K 5.6* 6.0* 6.3* 6.0* 3.5 3.8 4.8  CL 106 106 103 100*  --   --  74*  CO2 17* 18* 20* 21*  --   --  46*  GLUCOSE 79 57* 48* 79  --   --  72  BUN 88* 84* 85* 84*  --   --  64*  CREATININE 4.26* 4.32* 4.25* 4.54*  --   --  3.96*  CALCIUM 9.3 8.9 8.5* 8.6*  --   --  6.4*  PHOS  --   --  4.4  --   --   --  3.2   GFR: Estimated Creatinine Clearance: 9.1 mL/min (A) (by C-G formula based on SCr of 3.96 mg/dL (H)). Liver Function Tests: Recent Labs  Lab 01/30/18 1426 01/31/18 0241 02/01/18 0205  AST 29 31 29   ALT 26 19 18   ALKPHOS 64 48 46  BILITOT 0.6 0.7 0.7  PROT 7.2 5.6* 4.8*  ALBUMIN 3.1* 2.2* 1.9*  1.9*   Recent Labs  Lab 01/30/18 1426  LIPASE 48   No results for input(s): AMMONIA in the last 168 hours. Coagulation Profile: No results for input(s): INR, PROTIME in the last 168 hours. Cardiac Enzymes: No results for input(s): CKTOTAL, CKMB, CKMBINDEX, TROPONINI in the last 168  hours. BNP (last 3 results) No results for input(s): PROBNP in the last 8760 hours. HbA1C: No results for input(s): HGBA1C in the last 72 hours. CBG: Recent Labs  Lab 01/31/18 1145 01/31/18 1344 01/31/18 1637 01/31/18 2119 02/01/18 0815  GLUCAP 74 113* 116* 81 63*   Lipid Profile: No results for input(s): CHOL, HDL, LDLCALC, TRIG, CHOLHDL, LDLDIRECT in the last 72 hours. Thyroid Function Tests: No results for input(s): TSH, T4TOTAL, FREET4, T3FREE, THYROIDAB in the last 72 hours. Anemia Panel: No results for input(s): VITAMINB12,  FOLATE, FERRITIN, TIBC, IRON, RETICCTPCT in the last 72 hours. Sepsis Labs: Recent Labs  Lab 01/30/18 1646 01/30/18 1828 01/31/18 1426 01/31/18 1735  LATICACIDVEN 3.44* 4.60* 2.6* 2.4*    Recent Results (from the past 240 hour(s))  Blood Culture (routine x 2)     Status: None (Preliminary result)   Collection Time: 01/30/18  4:32 PM  Result Value Ref Range Status   Specimen Description BLOOD RIGHT WRIST  Final   Special Requests   Final    BOTTLES DRAWN AEROBIC AND ANAEROBIC Blood Culture adequate volume Performed at [facility], [address] [place], [place] [address]    Culture   Final    NO GROWTH < 24 HOURS Performed at [facility], [place] [address] [place], [place] [address]    Report Status PENDING  Incomplete  Blood Culture (routine x 2)     Status: None (Preliminary result)   Collection Time: 01/30/18  4:36 PM  Result Value Ref Range Status   Specimen Description BLOOD RIGHT HAND  Final   Special Requests   Final    BOTTLES DRAWN AEROBIC AND ANAEROBIC Blood Culture adequate volume Performed at [facility], [address] [place], [place] [address]    Culture   Final    NO GROWTH < 24 HOURS Performed at [facility], [place] [address], [place], [place] [address]    Report Status PENDING  Incomplete  MRSA PCR Screening     Status: None   Collection Time: 01/30/18 10:16 PM  Result Value Ref  Range Status   MRSA by PCR NEGATIVE NEGATIVE Final    Comment:        The GeneXpert MRSA Assay (FDA approved for NASAL specimens only), is one component of a comprehensive MRSA colonization surveillance program. It is not intended to diagnose MRSA infection nor to guide or monitor treatment for MRSA infections. Performed at [facility], [place] [address], [place], [place] [address]   Gastrointestinal Panel by PCR , Stool     Status: None   Collection Time: 01/31/18  9:35 AM  Result Value Ref Range Status   Campylobacter species NOT DETECTED NOT DETECTED Final   Plesimonas shigelloides NOT DETECTED NOT DETECTED Final   Salmonella species NOT DETECTED NOT DETECTED Final   Yersinia enterocolitica NOT DETECTED NOT DETECTED Final   Vibrio species NOT DETECTED NOT DETECTED Final   Vibrio cholerae NOT DETECTED NOT DETECTED Final   Enteroaggregative E coli (EAEC) NOT DETECTED NOT DETECTED Final   Enteropathogenic E coli (EPEC) NOT DETECTED NOT DETECTED Final   Enterotoxigenic E coli (ETEC) NOT DETECTED NOT DETECTED Final   Shiga like toxin producing E coli (STEC) NOT DETECTED NOT DETECTED Final   Shigella/Enteroinvasive E coli (EIEC) NOT DETECTED NOT DETECTED Final   Cryptosporidium NOT DETECTED NOT DETECTED Final   Cyclospora cayetanensis NOT DETECTED NOT DETECTED Final   Entamoeba histolytica NOT DETECTED NOT DETECTED Final   Giardia lamblia NOT DETECTED NOT DETECTED Final   Adenovirus F40/41 NOT DETECTED NOT DETECTED Final   Astrovirus NOT DETECTED NOT DETECTED Final   Norovirus GI/GII NOT DETECTED NOT DETECTED Final   Rotavirus A NOT DETECTED NOT DETECTED Final   Sapovirus (I, II, IV, and V) NOT DETECTED NOT DETECTED Final    Comment: Performed at [facility], [address] [place], [place] [address]  C difficile quick scan w PCR reflex     Status: Abnormal  Collection Time: 01/31/18  9:36 AM  Result Value Ref Range Status   C Diff antigen POSITIVE (A) NEGATIVE  Final   C Diff toxin NEGATIVE NEGATIVE Final   C Diff interpretation Results are indeterminate. See PCR results.  Final    Comment: Performed at [facility], [place] [address] [place], [place] [address]  C. Diff by PCR, Reflexed     Status: Abnormal   Collection Time: 01/31/18  9:36 AM  Result Value Ref Range Status   Toxigenic C. Difficile by PCR POSITIVE (A) NEGATIVE Final    Comment: Positive for toxigenic C. difficile with little to no toxin production. Only treat if clinical presentation suggests symptomatic illness. Performed at [facility], [place] [address], [place],  [address]        Radiology Studies: Ct Abdomen Pelvis Wo Contrast  Result Date: 01/30/2018 CLINICAL DATA:  Abdominal pain.  Suspect colitis. EXAM: CT ABDOMEN AND PELVIS WITHOUT CONTRAST TECHNIQUE: Multidetector CT imaging of the abdomen and pelvis was performed following the standard protocol without IV contrast. COMPARISON:  09/21/2014 FINDINGS: Lower chest: Moderate cardiac enlargement. Aortic atherosclerosis. Calcification in the RCA and left circumflex coronary arteries noted. Small bilateral pleural effusions identified within the lung bases. Hepatobiliary: The liver has a nodular contour and there is hypertrophy of the lateral segment of left lobe of liver. No focal liver abnormality identified. Previous cholecystectomy. The proximal common bile duct measures up to 1.2 cm. Pancreas: Unremarkable. No pancreatic ductal dilatation or surrounding inflammatory changes. Spleen: Normal in size without focal abnormality. Adrenals/Urinary Tract: Normal adrenal glands. Chronic bilateral and symmetric perinephric soft tissue stranding compatible with prior insult. No mass or hydronephrosis. Urinary bladder appears normal. Stomach/Bowel: No abnormal distension of the stomach. The small bowel loops have a normal caliber. The appendix is visualized and appears normal. There is no pathologic dilatation of the colon. No  significant large bowel wall thickening or inflammation. Vascular/Lymphatic: Aortic atherosclerosis. No aneurysm. Scattered small upper abdominal lymph nodes identified but no adenopathy. No pelvic or inguinal adenopathy. Reproductive: Uterus and bilateral adnexa are unremarkable. Other: No significant free fluid or fluid collections identified within the abdomen or pelvis. Musculoskeletal: Previous left femur ORIF. The bones appear diffusely osteopenic. There is degenerative disc disease identified throughout the lower thoracic and lumbar spine. IMPRESSION: 1. Nodular liver compatible with cirrhosis. 2. Previous cholecystectomy with increase caliber of the CBD. No choledocholithiasis noted. 3. Cardiac enlargement, aortic atherosclerosis and coronary artery calcifications. Aortic Atherosclerosis (ICD10-I70.0). 4. Small pleural effusions. Electronically Signed   By: Kerby Moors M.D.   On: 01/30/2018 17:38      Scheduled Meds: . cholecalciferol  1,000 Units Oral Daily  . cycloSPORINE  1 drop Both Eyes BID  . dorzolamide-timolol  1 drop Both Eyes BID  . ezetimibe  10 mg Oral Daily  . gabapentin  300 mg Oral BID  . heparin  5,000 Units Subcutaneous Q8H  . insulin aspart  0-9 Units Subcutaneous TID WC  . latanoprost  1 drop Both Eyes QHS  . levothyroxine  25 mcg Oral QAC breakfast  . LORazepam  0.5 mg Oral BID  . omega-3 acid ethyl esters  1 g Oral BID  . patiromer  16.8 g Oral Daily  . sodium chloride flush  3 mL Intravenous Q12H  . vancomycin  125 mg Oral Q6H  . vitamin C  1,000 mg Oral Daily  . vitamin E  400 Units Oral Daily   Continuous Infusions: . dextrose 5 %  and 0.45% NaCl       LOS: 2 days    Time spent: 30 minutes   Dessa Phi, DO [facility] [email or web address] Password [email or web address] 02/01/2018, 11:13 AM

## 2018-02-02 ENCOUNTER — Inpatient Hospital Stay (HOSPITAL_COMMUNITY): Payer: Medicare Other

## 2018-02-02 DIAGNOSIS — L8990 Pressure ulcer of unspecified site, unspecified stage: Secondary | ICD-10-CM

## 2018-02-02 DIAGNOSIS — A498 Other bacterial infections of unspecified site: Secondary | ICD-10-CM

## 2018-02-02 DIAGNOSIS — Z515 Encounter for palliative care: Secondary | ICD-10-CM

## 2018-02-02 DIAGNOSIS — Z7189 Other specified counseling: Secondary | ICD-10-CM

## 2018-02-02 DIAGNOSIS — A419 Sepsis, unspecified organism: Secondary | ICD-10-CM

## 2018-02-02 DIAGNOSIS — N289 Disorder of kidney and ureter, unspecified: Secondary | ICD-10-CM

## 2018-02-02 DIAGNOSIS — E875 Hyperkalemia: Secondary | ICD-10-CM

## 2018-02-02 DIAGNOSIS — B9689 Other specified bacterial agents as the cause of diseases classified elsewhere: Secondary | ICD-10-CM

## 2018-02-02 LAB — RENAL FUNCTION PANEL
ALBUMIN: 2.3 g/dL — AB (ref 3.5–5.0)
ANION GAP: 15 (ref 5–15)
Albumin: 2.1 g/dL — ABNORMAL LOW (ref 3.5–5.0)
Anion gap: 16 — ABNORMAL HIGH (ref 5–15)
BUN: 81 mg/dL — AB (ref 6–20)
BUN: 78 mg/dL — ABNORMAL HIGH (ref 6–20)
CALCIUM: 8.1 mg/dL — AB (ref 8.9–10.3)
CALCIUM: 7.9 mg/dL — AB (ref 8.9–10.3)
CHLORIDE: 88 mmol/L — AB (ref 101–111)
CO2: 27 mmol/L (ref 22–32)
CO2: 26 mmol/L (ref 22–32)
Chloride: 86 mmol/L — ABNORMAL LOW (ref 101–111)
Creatinine, Ser: 5.72 mg/dL — ABNORMAL HIGH (ref 0.44–1.00)
Creatinine, Ser: 6.07 mg/dL — ABNORMAL HIGH (ref 0.44–1.00)
GFR calc Af Amer: 7 mL/min — ABNORMAL LOW (ref >60)
GFR calc Af Amer: 6 mL/min — ABNORMAL LOW (ref >60)
GFR calc non Af Amer: 6 mL/min — ABNORMAL LOW (ref >60)
GFR calc non Af Amer: 6 mL/min — ABNORMAL LOW (ref >60)
Glucose, Bld: 162 mg/dL — ABNORMAL HIGH (ref 65–99)
Glucose, Bld: 100 mg/dL — ABNORMAL HIGH (ref 65–99)
Phosphorus: 4.6 mg/dL (ref 2.5–4.6)
Phosphorus: 4.9 mg/dL — ABNORMAL HIGH (ref 2.5–4.6)
Potassium: 6.1 mmol/L — ABNORMAL HIGH (ref 3.5–5.1)
Potassium: 5.5 mmol/L — ABNORMAL HIGH (ref 3.5–5.1)
SODIUM: 129 mmol/L — AB (ref 135–145)
Sodium: 129 mmol/L — ABNORMAL LOW (ref 135–145)

## 2018-02-02 LAB — POTASSIUM
POTASSIUM: 5.5 mmol/L — AB (ref 3.5–5.1)
POTASSIUM: 5.4 mmol/L — AB (ref 3.5–5.1)
Potassium: 5.6 mmol/L — ABNORMAL HIGH (ref 3.5–5.1)

## 2018-02-02 LAB — CBC
HCT: 34 % — ABNORMAL LOW (ref 36.0–46.0)
Hemoglobin: 11.3 g/dL — ABNORMAL LOW (ref 12.0–15.0)
MCH: 31.9 pg (ref 26.0–34.0)
MCHC: 33.2 g/dL (ref 30.0–36.0)
MCV: 96 fL (ref 78.0–100.0)
Platelets: 204 10*3/uL (ref 150–400)
RBC: 3.54 MIL/uL — ABNORMAL LOW (ref 3.87–5.11)
RDW: 13.3 % (ref 11.5–15.5)
WBC: 10.8 10*3/uL — ABNORMAL HIGH (ref 4.0–10.5)

## 2018-02-02 LAB — GLUCOSE, CAPILLARY
Glucose-Capillary: 153 mg/dL — ABNORMAL HIGH (ref 65–99)
Glucose-Capillary: 103 mg/dL — ABNORMAL HIGH (ref 65–99)
Glucose-Capillary: 91 mg/dL (ref 65–99)
Glucose-Capillary: 95 mg/dL (ref 65–99)

## 2018-02-02 MED ORDER — ALBUTEROL SULFATE (2.5 MG/3ML) 0.083% IN NEBU
10.0000 mg | INHALATION_SOLUTION | Freq: Once | RESPIRATORY_TRACT | Status: AC
  Administered 2018-02-02: 10 mg via RESPIRATORY_TRACT
  Filled 2018-02-02: qty 12

## 2018-02-02 MED ORDER — INSULIN ASPART 100 UNIT/ML IV SOLN
5.0000 [IU] | Freq: Once | INTRAVENOUS | Status: AC
  Administered 2018-02-02: 5 [IU] via INTRAVENOUS

## 2018-02-02 MED ORDER — SODIUM CHLORIDE 0.9 % IV SOLN
INTRAVENOUS | Status: DC
  Administered 2018-02-02 – 2018-02-03 (×3): via INTRAVENOUS

## 2018-02-02 MED ORDER — DEXTROSE 50 % IV SOLN
1.0000 | Freq: Once | INTRAVENOUS | Status: AC
  Administered 2018-02-02: 50 mL via INTRAVENOUS
  Filled 2018-02-02: qty 50

## 2018-02-02 MED ORDER — GABAPENTIN 100 MG PO CAPS
100.0000 mg | ORAL_CAPSULE | Freq: Every day | ORAL | Status: DC
  Administered 2018-02-03 – 2018-02-07 (×5): 100 mg via ORAL
  Filled 2018-02-02 (×5): qty 1

## 2018-02-02 NOTE — Progress Notes (Addendum)
PROGRESS NOTE    Melissa Mccullough  FTD:[id number] DOB: December 01, 1928 DOA: 01/30/2018 PCP: System, Pcp Not In     Brief Narrative:  Melissa Mccullough is a 84 y.o. female with medical history significant of type 2 diabetes on oral medication, hypertension, glaucoma, hyperlipidemia, hypothyroidism who comes in with diarrhea for approximately 1 week.  Per report of the patient and her family she began to have nausea, vomiting, and diarrhea approximately 8 days ago.  She was seen in the ER where she was noted to be dehydrated and treated symptomatically.  Per the family, her nausea and vomiting resolved.  Her diarrhea continued however.  She began to have multiple bowel movements a day.  There was never any blood frank blood in the bowel movements.  On discussion with the patient, she reports that she had crampy abdominal pain that was intermittent but wax and wane throughout the day.  She began to have innumerable bowel movements and family brought her back to the ED at [facility].  In the ED, she was noted to be in renal failure with Cr 4.3 and potassium 7. She was given IV zosyn and oral vanco for concern of infectious colitis. Nephrology was consulted and patient started on IV sodium bicarb. She tested positive for C Diff.   Assessment & Plan:   Principal Problem:   Acute renal insufficiency Active Problems:   Essential hypertension   Hyperlipidemia   Hypothyroidism   Renal failure   Sepsis ([place])   Diarrhea   Pressure injury of skin   Hyperkalemia   Clostridium difficile infection   Palliative care by specialist   Goals of care, counseling/discussion   Severe sepsis secondary to C Diff  -C Diff positive PCR -Continue PO vanco -IVF  AKI -In setting of volume loss, lisinopril, HCTZ -Nephrology following -Cr improved yesterday and acutely worsened overnight  -IVF switched to normal saline   Hyperkalemia -No kayexalate due to diarrhea. Holding lasix due to AKI. Treated this  morning with albuterol, insulin/dextros -Trend potassium q4h   Lactic acidosis -In setting of acute renal failure, C Diff infection/sepsis, metformin -Improved   Type 2 DM -Hold glipizide, metformin, glitazone  HTN -Hold norvasc, lisinopril, HCTZ for now  Hypothyroidism -Continue synthroid  HLD -Continue zetia   Cirrhosis -Found on CT abdomen  Memory loss -Per son, patient has had slow progressive memory loss without formal dementia diagnosis   Goals of care -Patient's niece requested palliative care consult for goals of care discussion. Discussed with son today; they're realistic about patient's illness and do want to treat what we are able. If patient's condition worsens, he would like to "know when to stop" and focus on comfort when the time comes.    DVT prophylaxis: subq hep  Code Status: DNR Family Communication: Spoke with son over the phone for update as well as with granddaughter  Disposition Plan: Tentative, pending kidney function    Consultants:   Nephrology  Procedures:   None   Antimicrobials:  Anti-infectives (From admission, onward)   Start     Dose/Rate Route Frequency Ordered Stop   01/31/18 0500  piperacillin-tazobactam (ZOSYN) IVPB 3.375 g  Status:  Discontinued     3.375 g 12.5 mL/hr over 240 Minutes Intravenous Every 12 hours 01/30/18 1623 01/31/18 0419   01/30/18 1800  vancomycin (VANCOCIN) 50 mg/mL oral solution 125 mg     125 mg Oral Every 6 hours 01/30/18 1641     01/30/18 1615  piperacillin-tazobactam (ZOSYN)  IVPB 3.375 g     3.375 g 100 mL/hr over 30 Minutes Intravenous  Once 01/30/18 1605 01/30/18 1803   01/30/18 1615  metroNIDAZOLE (FLAGYL) IVPB 500 mg  Status:  Discontinued     500 mg 100 mL/hr over 60 Minutes Intravenous  Once 01/30/18 1606 01/30/18 1712       Subjective: Patient's mentation waxes and wanes. She is more confused this morning. No physical complaints.    Objective: Vitals:   02/02/18 0619 02/02/18 0620  02/02/18 0945 02/02/18 1353  BP: (!) 178/67   (!) 132/49  Pulse: 82   85  Resp: 16   16  Temp:  98.2 F (36.8 C)  98.1 F (36.7 C)  TempSrc:  Axillary  Oral  SpO2: (!) 88%  94% 92%  Weight:      Height:        Intake/Output Summary (Last 24 hours) at 02/02/2018 1407 Last data filed at 02/02/2018 0840 Gross per 24 hour  Intake 1314.66 ml  Output 1047 ml  Net 267.66 ml   Filed Weights   01/30/18 1222 01/31/18 0620  Weight: 54.4 kg (120 lb) 75.2 kg (165 lb 12.6 oz)    Examination: General exam: Appears calm and comfortable, ill appearing  Respiratory system: Clear to auscultation. Respiratory effort normal. Cardiovascular system: S1 & S2 heard, RRR. No JVD, murmurs, rubs, gallops or clicks. No pedal edema. Gastrointestinal system: Abdomen is nondistended, soft and nontender. No organomegaly or masses felt. Normal bowel sounds heard. Central nervous system: Alert and oriented to self only  Extremities: Symmetric Skin: No rashes, lesions or ulcers Psychiatry: +Hx dementia    Data Reviewed: I have personally reviewed following labs and imaging studies  CBC: Recent Labs  Lab 01/30/18 1426 01/31/18 0241 02/01/18 0205 02/02/18 0220  WBC 14.5* 12.5* 10.0 10.8*  NEUTROABS 10.3*  --   --   --   HGB 11.9* 10.3* 10.0* 11.3*  HCT 36.4 31.3* 30.0* 34.0*  MCV 98.6 96.9 94.9 96.0  PLT 246 225 207 657   Basic Metabolic Panel: Recent Labs  Lab 01/30/18 2301 01/31/18 0241 01/31/18 0850 01/31/18 1426 01/31/18 1735 02/01/18 0205 02/02/18 0220 02/02/18 1134  NA 136 135 135  --   --  133* 129*  --   K 6.0* 6.3* 6.0* 3.5 3.8 4.8 6.1* 5.5*  CL 106 103 100*  --   --  74* 86*  --   CO2 18* 20* 21*  --   --  46* 27  --   GLUCOSE 57* 48* 79  --   --  72 162*  --   BUN 84* 85* 84*  --   --  64* 81*  --   CREATININE 4.32* 4.25* 4.54*  --   --  3.96* 5.72*  --   CALCIUM 8.9 8.5* 8.6*  --   --  6.4* 8.1*  --   PHOS  --  4.4  --   --   --  3.2 4.6  --    GFR: Estimated Creatinine  Clearance: 6.3 mL/min (A) (by C-G formula based on SCr of 5.72 mg/dL (H)). Liver Function Tests: Recent Labs  Lab 01/30/18 1426 01/31/18 0241 02/01/18 0205 02/02/18 0220  AST 29 31 29   --   ALT 26 19 18   --   ALKPHOS 64 48 46  --   BILITOT 0.6 0.7 0.7  --   PROT 7.2 5.6* 4.8*  --   ALBUMIN 3.1* 2.2* 1.9*  1.9* 2.3*  Recent Labs  Lab 01/30/18 1426  LIPASE 48   No results for input(s): AMMONIA in the last 168 hours. Coagulation Profile: No results for input(s): INR, PROTIME in the last 168 hours. Cardiac Enzymes: No results for input(s): CKTOTAL, CKMB, CKMBINDEX, TROPONINI in the last 168 hours. BNP (last 3 results) No results for input(s): PROBNP in the last 8760 hours. HbA1C: No results for input(s): HGBA1C in the last 72 hours. CBG: Recent Labs  Lab 02/01/18 1608 02/01/18 2040 02/01/18 2231 02/02/18 0808 02/02/18 1220  GLUCAP 104* 166* 172* 153* 103*   Lipid Profile: No results for input(s): CHOL, HDL, LDLCALC, TRIG, CHOLHDL, LDLDIRECT in the last 72 hours. Thyroid Function Tests: No results for input(s): TSH, T4TOTAL, FREET4, T3FREE, THYROIDAB in the last 72 hours. Anemia Panel: No results for input(s): VITAMINB12, FOLATE, FERRITIN, TIBC, IRON, RETICCTPCT in the last 72 hours. Sepsis Labs: Recent Labs  Lab 01/30/18 1646 01/30/18 1828 01/31/18 1426 01/31/18 1735  LATICACIDVEN 3.44* 4.60* 2.6* 2.4*    Recent Results (from the past 240 hour(s))  Blood Culture (routine x 2)     Status: None (Preliminary result)   Collection Time: 01/30/18  4:32 PM  Result Value Ref Range Status   Specimen Description BLOOD RIGHT WRIST  Final   Special Requests   Final    BOTTLES DRAWN AEROBIC AND ANAEROBIC Blood Culture adequate volume Performed at [facility], [address] [place], [place] [address]    Culture   Final    NO GROWTH 2 DAYS Performed at [facility], [place] [address] [place], [place] [address]    Report Status PENDING  Incomplete    Blood Culture (routine x 2)     Status: None (Preliminary result)   Collection Time: 01/30/18  4:36 PM  Result Value Ref Range Status   Specimen Description BLOOD RIGHT HAND  Final   Special Requests   Final    BOTTLES DRAWN AEROBIC AND ANAEROBIC Blood Culture adequate volume Performed at [facility], [address] [place], [place] [address]    Culture   Final    NO GROWTH 2 DAYS Performed at [facility], [place] [address] [place], [place] [address]    Report Status PENDING  Incomplete  MRSA PCR Screening     Status: None   Collection Time: 01/30/18 10:16 PM  Result Value Ref Range Status   MRSA by PCR NEGATIVE NEGATIVE Final    Comment:        The GeneXpert MRSA Assay (FDA approved for NASAL specimens only), is one component of a comprehensive MRSA colonization surveillance program. It is not intended to diagnose MRSA infection nor to guide or monitor treatment for MRSA infections. Performed at [facility], [place] [address], [place], [place] [address]   Gastrointestinal Panel by PCR , Stool     Status: None   Collection Time: 01/31/18  9:35 AM  Result Value Ref Range Status   Campylobacter species NOT DETECTED NOT DETECTED Final   Plesimonas shigelloides NOT DETECTED NOT DETECTED Final   Salmonella species NOT DETECTED NOT DETECTED Final   Yersinia enterocolitica NOT DETECTED NOT DETECTED Final   Vibrio species NOT DETECTED NOT DETECTED Final   Vibrio cholerae NOT DETECTED NOT DETECTED Final   Enteroaggregative E coli (EAEC) NOT DETECTED NOT DETECTED Final   Enteropathogenic E coli (EPEC) NOT DETECTED NOT DETECTED Final   Enterotoxigenic E coli (ETEC) NOT DETECTED NOT DETECTED Final  Shiga like toxin producing E coli (STEC) NOT DETECTED NOT DETECTED Final   Shigella/Enteroinvasive E coli (EIEC) NOT DETECTED NOT DETECTED Final   Cryptosporidium NOT DETECTED NOT DETECTED Final   Cyclospora cayetanensis NOT DETECTED NOT DETECTED Final   Entamoeba  histolytica NOT DETECTED NOT DETECTED Final   Giardia lamblia NOT DETECTED NOT DETECTED Final   Adenovirus F40/41 NOT DETECTED NOT DETECTED Final   Astrovirus NOT DETECTED NOT DETECTED Final   Norovirus GI/GII NOT DETECTED NOT DETECTED Final   Rotavirus A NOT DETECTED NOT DETECTED Final   Sapovirus (I, II, IV, and V) NOT DETECTED NOT DETECTED Final    Comment: Performed at [facility], [address] [place], [place] [address]  C difficile quick scan w PCR reflex     Status: Abnormal   Collection Time: 01/31/18  9:36 AM  Result Value Ref Range Status   C Diff antigen POSITIVE (A) NEGATIVE Final   C Diff toxin NEGATIVE NEGATIVE Final   C Diff interpretation Results are indeterminate. See PCR results.  Final    Comment: Performed at [facility], [place] [address] [place], [place] [address]  C. Diff by PCR, Reflexed     Status: Abnormal   Collection Time: 01/31/18  9:36 AM  Result Value Ref Range Status   Toxigenic C. Difficile by PCR POSITIVE (A) NEGATIVE Final    Comment: Positive for toxigenic C. difficile with little to no toxin production. Only treat if clinical presentation suggests symptomatic illness. Performed at [facility], [place] [address] [place], [place] [address]        Radiology Studies: US Renal  Result Date: 02/02/2018 CLINICAL DATA:  Acute kidney injury. EXAM: RENAL / URINARY TRACT ULTRASOUND COMPLETE COMPARISON:  CT scan of January 30, 2018. FINDINGS: Right Kidney: Length: 10.4 cm. 7 mm cyst is seen in upper pole. Echogenicity within normal limits. No mass or hydronephrosis visualized. Left Kidney: Length: 10.9 cm. 1 cm cyst is seen in midpole. Echogenicity within normal limits. No mass or hydronephrosis visualized. Bladder: Nondistended secondary to Foley catheter. IMPRESSION: Bilateral simple renal cysts.  No other renal abnormality is noted. Electronically Signed   By: Marijo Conception, M.D.   On: 02/02/2018 13:44      Scheduled Meds: .  cholecalciferol  1,000 Units Oral Daily  . cycloSPORINE  1 drop Both Eyes BID  . dorzolamide-timolol  1 drop Both Eyes BID  . ezetimibe  10 mg Oral Daily  . [START ON 02/03/2018] gabapentin  100 mg Oral QHS  . heparin  5,000 Units Subcutaneous Q8H  . insulin aspart  0-9 Units Subcutaneous TID WC  . latanoprost  1 drop Both Eyes QHS  . levothyroxine  25 mcg Oral QAC breakfast  . LORazepam  0.5 mg Oral BID  . omega-3 acid ethyl esters  1 g Oral BID  . patiromer  16.8 g Oral Daily  . sodium chloride flush  3 mL Intravenous Q12H  . vancomycin  125 mg Oral Q6H  . vitamin C  1,000 mg Oral Daily  . vitamin E  400 Units Oral Daily   Continuous Infusions: . sodium chloride 100 mL/hr at 02/02/18 0824     LOS: 3 days    Time spent: 30 minutes   Dessa Phi, DO [facility] [email or web address] Password [facility] 02/02/2018, 2:07 PM

## 2018-02-02 NOTE — Progress Notes (Deleted)
Patient's daughter received a call from [place] that her oxygen was about to be set up at [facility]. Called [facility] to set up transport. Patient and family aware.

## 2018-02-02 NOTE — Progress Notes (Signed)
Admit: 01/30/2018 LOS: 3  Melissa Mccullough with AKI 2/2 hypovolemia (CDI) + ACEi + Diuretic  Subjective:  Labs were improivng but SCr up to 1.72 and K 6.1 this AM 1L UOP yesterday Given veltassa, albuterol, insulin this AM Patient somnolent, not participatory No significant stool output  02/18 0701 - 02/19 0700 In: 1314.7 [I.V.:1314.7] Out: 1047 [Urine:1045; Stool:2]  Filed Weights   01/30/18 1222 01/31/18 0620  Weight: 54.4 kg (120 lb) 75.2 kg (165 lb 12.6 oz)    Scheduled Meds: . cholecalciferol  1,000 Units Oral Daily  . cycloSPORINE  1 drop Both Eyes BID  . dorzolamide-timolol  1 drop Both Eyes BID  . ezetimibe  10 mg Oral Daily  . gabapentin  300 mg Oral BID  . heparin  5,000 Units Subcutaneous Q8H  . insulin aspart  0-9 Units Subcutaneous TID WC  . latanoprost  1 drop Both Eyes QHS  . levothyroxine  25 mcg Oral QAC breakfast  . LORazepam  0.5 mg Oral BID  . omega-3 acid ethyl esters  1 g Oral BID  . patiromer  16.8 g Oral Daily  . sodium chloride flush  3 mL Intravenous Q12H  . vancomycin  125 mg Oral Q6H  . vitamin C  1,000 mg Oral Daily  . vitamin E  400 Units Oral Daily   Continuous Infusions: . sodium chloride 100 mL/hr at 02/02/18 0824   PRN Meds:.acetaminophen **OR** acetaminophen, HYDROcodone-acetaminophen, ondansetron **OR** ondansetron (ZOFRAN) IV  Current Labs: reviewed    Physical Exam:  Blood pressure (!) 178/67, pulse 82, temperature 98.2 F (36.8 C), temperature source Axillary, resp. rate 16, height 5' (1.524 m), weight 75.2 kg (165 lb 12.6 oz), SpO2 94 %. RRR CTAB No LEE S/nt/nd, +BS No rashes/lesions EOMI NCAT Foley catheter in place  A 1. Resolving AKI, normal baseline 2/2 hypovolemia (CDI + thiazide) + ACEi use; worsening in the last 24 hours, unclear why 2. CDI on Vanco per TRH; stool frequency/volume improving 3. Hyperkalemia, recurrent 4. DM2 holding metformin 5. HTN holding home meds 6. Memory loss  P 1. Continue hydration 2. Check  afternoon labs 3. Daily weights, Daily Renal Panel, Strict I/Os, Avoid nephrotoxins (NSAIDs, judicious IV Contrast) 4. Will follow for another 24h   Pearson Grippe MD 02/02/2018, 12:33 PM  Recent Labs  Lab 01/31/18 0241 01/31/18 0850  02/01/18 0205 02/02/18 0220 02/02/18 1134  NA 135 135  --  133* 129*  --   K 6.3* 6.0*   < > 4.8 6.1* 5.5*  CL 103 100*  --  74* 86*  --   CO2 20* 21*  --  46* 27  --   GLUCOSE 48* 79  --  72 162*  --   BUN 85* 84*  --  64* 81*  --   CREATININE 4.25* 4.54*  --  3.96* 5.72*  --   CALCIUM 8.5* 8.6*  --  6.4* 8.1*  --   PHOS 4.4  --   --  3.2 4.6  --    < > = values in this interval not displayed.   Recent Labs  Lab 01/30/18 1426 01/31/18 0241 02/01/18 0205 02/02/18 0220  WBC 14.5* 12.5* 10.0 10.8*  NEUTROABS 10.3*  --   --   --   HGB 11.9* 10.3* 10.0* 11.3*  HCT 36.4 31.3* 30.0* 34.0*  MCV 98.6 96.9 94.9 96.0  PLT 246 225 207 204

## 2018-02-02 NOTE — Consult Note (Signed)
[facility] Nurse wound consult note Right heel assessed to evaluate for possible stage 1 PI per PIP data.  Upon exam the right heel had a heel foam dressing in place.  The heel is pink, tissue intact, and DOES blanche.  Prevalon boots ordered to reduce pressure to heels.  Discussed POC with patient and bedside nurse.  Re consult if needed, will not follow at this time. Thank you,  Val Riles MSN,RN,CWOCN,CNS-BC, [phone])

## 2018-02-02 NOTE — Care Management Note (Addendum)
Case Management Note  Patient Details  Name: Melissa Mccullough MRN: [id number] Date of Birth: 1928/07/22  Subjective/Objective:      ARF/hyperkalemia, C.diff positive,hx of DM and HTN. From [facility].            [name] ([address] Shavona Gunderman (Daughter)    [phone]      PCP:  Action/Plan: PT,OT evaluations pending....Marland KitchenNCM to f/u with transitional care needs.  Expected Discharge Date:  02/07/18               Expected Discharge Plan:    In-House Referral:  Clinical Social Work  Discharge planning Services  CM Consult  Post Acute Care Choice:    Choice offered to:     DME Arranged:    DME Agency:     HH Arranged:    HH Agency:     Status of Service:  In process, will continue to follow  If discussed at Long Length of Stay Meetings, dates discussed:    Additional Comments:  Sharin Mons, RN 02/02/2018, 6:11 PM

## 2018-02-02 NOTE — Progress Notes (Signed)
Advised by MD to hold off on PT/OT today due to patient's hyperkalemia.

## 2018-02-02 NOTE — Consult Note (Signed)
Consultation Note Date: 02/02/2018   Patient Name: Melissa Mccullough  DOB: 1928-06-11  MRN: [id number]  Age / Sex: 84 y.o., female  PCP: [facility], [place] Not In Referring Physician: Dessa Phi, DO  Reason for Consultation: Establishing goals of care  HPI/Patient Profile: 84 y.o. female  with past medical history of diabetes mellitus type 2, neuropathy, hypothyroidism, hypertension, hyperlipidemia, glaucoma, and anemia admitted on 01/30/2018 with diarrhea x1 week. In ED, WBC's 14.5, lactic acid 4.6, K+ 7.2 and BUN/Cr 95/4.3. Nephrology consulted and initiated sodium bicarb infusion. CT negative for acute findings. Started on IV antibiotics for concern of infectious colitis. Positive for cdiff and receiving oral vancomycin. Worsening renal functions on 2/19. Good urine output. Palliative medicine consultation for goals of care.   Clinical Assessment and Goals of Care: I have reviewed medical records, discussed with care team, and met with patient and son Melissa Mccullough) at bedside to discuss diagnosis, [place], EOL wishes, disposition and options.  Patient is awake and alert to person and place. She is hard of hearing. She will answer questions and follow commands. She minimally participates in conversation due to her hearing and currently receiving continuous neb treatment.   Introduced Palliative Medicine as specialized medical care for people living with serious illness. It focuses on providing relief from the symptoms and stress of a serious illness. The goal is to improve quality of life for both the patient and the family.  We discussed a brief life review of the patient. Prior to hospitalization, patient living at independent living facility. Independent and ambulatory with walker. Good appetite. Three adult children, Melissa Mccullough and two daughters living in the area. Melissa Mccullough speaks of her diarrhea for 1 week, but worsening three  days prior to admission.   Discussed hospital diagnoses and interventions. At this point, Melissa Mccullough pulls me to the side of the room. He tells me he is waiting on Dr. Maylene Roes to call him back and notify him of prognosis. Melissa Mccullough tells me cognitively, she is better today and ate all of her breakfast. Also, without further diarrhea. Taking oral medications. I explained that clinically she has shown some improvement. I did explain concern with worsening renal function today but that she has good urine output. Reminded him that nephrology is following.   Advanced directives, concepts specific to code status, artifical feeding and hydration, and rehospitalization were considered and discussed. Melissa Mccullough tells me he is documented HCPOA. Requested documentation. Melissa Mccullough confirms his mothers wishes against heroic measures at EOL including resuscitation, life support, or feeding tube. I confirmed that his sisters Melissa Mccullough and Melissa Mccullough) also know of her wishes, for which they do.   I attempted to elicit values and goals of care important to the patient. Melissa Mccullough tells me his mother is "ready to go" when her time comes. She "misses her husband" and has been ready to die for many years now. We discussed being in a watchful waiting period including continuing antibiotics to treat infection and monitoring kidney functions. Melissa Mccullough is worried she will not be able to return back  to ILF. I did explain that she will likely need SNF for rehab if she continues to improve and able to participate with PT/OT.   Introduced and completed MOST form with patient and Melissa Mccullough. Melissa Mccullough confirms her wishes again resuscitation, life support, or feeding tube. She agrees with limited interventions and time trial of ABX/IVF. Melissa Mccullough speaks of her not wanting continued antibiotics/IVF or life-prolonging measures if she was NOT showing improvement from this hospitalization. Would NOT pursue dialysis if indicated. At that point, briefly educated on comfort  focused care and hospice approach. Melissa Mccullough states "we don't want her to suffer" (meaning him and his sisters).   PMT contact information given. Emotional/spiritual support provided.    SUMMARY OF RECOMMENDATIONS    MOST form completed with patient and son. DNR/DNI, limited interventions, ABX/IVF for time trial, no feeding tube, no dialysis.   Continue supportive care/medical management.   Renal function worse today. Nephrology following.   PT/OT pending. Likely will need SNF for rehab.  Watchful waiting. Long-term, patient and son clear on wishes against life prolonging measures (including ABX/IVF) if she was not showing clinical improvement.   PMT will continue to support patient/family through hospitalization.   Code Status/Advance Care Planning:  DNR  Symptom Management:   Per attending  Palliative Prophylaxis:   Aspiration, Delirium Protocol, Frequent Pain Assessment, Oral Care and Turn Reposition  Additional Recommendations (Limitations, Scope, Preferences):  DNR/DNI. Continue medical management.  Psycho-social/Spiritual:   Desire for further Chaplaincy support: yes  Additional Recommendations: Caregiving  Support/Resources and Education on Hospice  Prognosis:   Unable to determine  Discharge Planning: To Be Determined      Primary Diagnoses: Present on Admission: . Renal failure . Hyperlipidemia . Essential hypertension . Hypothyroidism . Sepsis ([place])   I have reviewed the medical record, interviewed the patient and family, and examined the patient. The following aspects are pertinent.  Past Medical History:  Diagnosis Date  . Anemia   . Glaucoma   . Hyperglycemia 04/2014  . Hyperlipidemia   . Hypertension   . Hypothyroidism   . Neuropathy   . Type II or unspecified type diabetes mellitus without mention of complication, not stated as uncontrolled    Social History   Socioeconomic History  . Marital status: Widowed    Spouse name: None    . Number of children: None  . Years of education: None  . Highest education level: None  Social Needs  . Financial resource strain: None  . Food insecurity - worry: None  . Food insecurity - inability: None  . Transportation needs - medical: None  . Transportation needs - non-medical: None  Occupational History  . None  Tobacco Use  . Smoking status: Former Research scientist (life sciences)  . Smokeless tobacco: Never Used  Substance and Sexual Activity  . Alcohol use: No  . Drug use: No  . Sexual activity: None  Other Topics Concern  . None  Social History Narrative  . None   Family History  Problem Relation Age of Onset  . Cancer Other   . Heart attack Other   . Heart disease Mother    Scheduled Meds: . cholecalciferol  1,000 Units Oral Daily  . cycloSPORINE  1 drop Both Eyes BID  . dorzolamide-timolol  1 drop Both Eyes BID  . ezetimibe  10 mg Oral Daily  . [START ON 02/03/2018] gabapentin  100 mg Oral QHS  . heparin  5,000 Units Subcutaneous Q8H  . insulin aspart  0-9 Units Subcutaneous TID WC  .  latanoprost  1 drop Both Eyes QHS  . levothyroxine  25 mcg Oral QAC breakfast  . LORazepam  0.5 mg Oral BID  . omega-3 acid ethyl esters  1 g Oral BID  . patiromer  16.8 g Oral Daily  . sodium chloride flush  3 mL Intravenous Q12H  . vancomycin  125 mg Oral Q6H  . vitamin C  1,000 mg Oral Daily  . vitamin E  400 Units Oral Daily   Continuous Infusions: . sodium chloride 100 mL/hr at 02/02/18 0824   PRN Meds:.acetaminophen **OR** acetaminophen, HYDROcodone-acetaminophen, ondansetron **OR** ondansetron (ZOFRAN) IV Medications Prior to Admission:  Prior to Admission medications   Medication Sig Start Date End Date Taking? Authorizing Provider  amLODipine (NORVASC) 5 MG tablet Take 5 mg by mouth daily.   Yes [provider]  aspirin EC 81 MG tablet Take 81 mg by mouth daily with lunch.   Yes [provider]  brimonidine (ALPHAGAN) 0.2 % ophthalmic solution Place 1 drop into the  right eye 3 (three) times daily. 01/15/18  Yes [provider]  cycloSPORINE (RESTASIS) 0.05 % ophthalmic emulsion Place 1 drop into both eyes every 12 (twelve) hours.    Yes [provider]  dorzolamide-timolol (COSOPT) 22.3-6.8 MG/ML ophthalmic solution Place 1 drop into both eyes 2 (two) times daily.  03/21/14  Yes [provider]  ezetimibe (ZETIA) 10 MG tablet Take 10 mg by mouth daily.   Yes [provider]  gabapentin (NEURONTIN) 600 MG tablet Take 600 mg by mouth 4 (four) times daily.   Yes [provider]  glipiZIDE (GLUCOTROL XL) 2.5 MG 24 hr tablet Take 2.5 mg by mouth daily with breakfast.   Yes [provider]  hydrochlorothiazide (HYDRODIURIL) 25 MG tablet Take 25 mg by mouth daily.  03/17/14  Yes [provider]  latanoprost (XALATAN) 0.005 % ophthalmic solution Place 1 drop into both eyes at bedtime.   Yes [provider]  levothyroxine (SYNTHROID, LEVOTHROID) 50 MCG tablet Take 50 mcg by mouth daily before breakfast. 12/30/17  Yes [provider]  lisinopril (PRINIVIL,ZESTRIL) 10 MG tablet Take 1 tablet (10 mg total) by mouth daily. 05/13/14  Yes Rai, Ripudeep K, MD  LORazepam (ATIVAN) 0.5 MG tablet Take 0.25 mg by mouth 2 (two) times daily. EVENING and BEDTIME   Yes [provider]  metFORMIN (GLUCOPHAGE) 1000 MG tablet Take 1,000 mg by mouth 2 (two) times daily with a meal.   Yes [provider]  Multiple Vitamins-Minerals (ICAPS AREDS 2 PO) Take 1 capsule by mouth daily.   Yes [provider]  Multiple Vitamins-Minerals (ONE-A-DAY PROACTIVE 65+) TABS Take 1 tablet by mouth daily.   Yes [provider]  Omega-3 Fatty Acids (FISH OIL PO) Take 1 capsule by mouth daily.   Yes [provider]  pioglitazone (ACTOS) 15 MG tablet Take 15 mg by mouth daily.   Yes [provider]  White Petrolatum-Mineral Oil (GENTEAL TEARS NIGHT-TIME) OINT Place 1 application into  both eyes at bedtime.   Yes [provider]   Allergies  Allergen Reactions  . Codeine Rash  . Macrobid [Nitrofurantoin Macrocrystal] Rash   Review of Systems  Constitutional: Positive for activity change, appetite change and fatigue.  Respiratory: Positive for shortness of breath.   Gastrointestinal: Positive for diarrhea.   Physical Exam  Constitutional: She is cooperative. She appears ill.  HENT:  Head: Normocephalic and atraumatic.  Cardiovascular: Regular rhythm.  Pulmonary/Chest: No accessory muscle usage. No tachypnea.  No respiratory distress. She has decreased breath sounds.  Dyspnea at rest. Currently receiving breathing treatment.  Abdominal: Normal appearance. There is no tenderness.  Neurological: She is alert.  Oriented to person/place. HOH. Pleasantly confused.   Skin: Skin is warm. There is pallor.  Psychiatric: She has a normal mood and affect. Her speech is normal.  Nursing note and vitals reviewed.  Vital Signs: BP (!) 178/67   Pulse 82   Temp 98.2 F (36.8 C) (Axillary)   Resp 16   Ht 5' (1.524 m)   Wt 75.2 kg (165 lb 12.6 oz)   SpO2 94%   BMI 32.38 kg/m  Pain Assessment: No/denies pain     SpO2: SpO2: 94 % O2 Device:SpO2: 94 % O2 Flow Rate: .   IO: Intake/output summary:   Intake/Output Summary (Last 24 hours) at 02/02/2018 1336 Last data filed at 02/02/2018 0840 Gross per 24 hour  Intake 1317.66 ml  Output 1047 ml  Net 270.66 ml    LBM: Last BM Date: 02/01/18 Baseline Weight: Weight: 54.4 kg (120 lb) Most recent weight: Weight: 75.2 kg (165 lb 12.6 oz)     Palliative Assessment/Data: PPS 40%   Flowsheet Rows     Most Recent Value  Intake Tab  Referral Department  Hospitalist  Unit at Time of Referral  Med/Surg Unit  Palliative Care Primary Diagnosis  Sepsis/Infectious Disease  Palliative Care Type  New Palliative care  Reason for referral  Clarify Goals of Care  Date first seen by Palliative Care  02/02/18  Clinical  Assessment  Palliative Performance Scale Score  40%  Psychosocial & Spiritual Assessment  Palliative Care Outcomes  Patient/Family meeting held?  Yes  Who was at the meeting?  patient and son   Palliative Care Outcomes  Clarified goals of care, Counseled regarding hospice, Provided end of life care assistance, Provided advance care planning, Provided psychosocial or spiritual support, ACP counseling assistance      Time In: 0945 Time Out: 1055 Time Total: 86mn Greater than 50%  of this time was spent counseling and coordinating care related to the above assessment and plan.  Signed by:  MIhor Dow FNP-C Palliative Medicine Team  Phone: [phone]: [phone]  Please contact Palliative Medicine Team phone at [phone] questions and concerns.  For individual provider: See AShea Evans

## 2018-02-02 NOTE — Progress Notes (Signed)
OT Cancellation Note  Patient Details Name: DORATHA MCSWAIN MRN: [id number] DOB: October 11, 1928   Cancelled Treatment:    Reason Eval/Treat Not Completed: Medical issues which prohibited therapy(Pt with K+ of 6.1. MD requesting to defer therapy today.)  Malka So 02/02/2018, 11:10 AM  02/02/2018 Nestor Lewandowsky, OTR/L Pager: [phone]

## 2018-02-02 NOTE — Progress Notes (Signed)
PT Cancellation Note  Patient Details Name: EDDA OREA MRN: [id number] DOB: 27-Feb-1928   Cancelled Treatment:    Reason Eval/Treat Not Completed: Medical issues which prohibited therapy Pt with K+ of 6.1. MD requesting to defer therapy today.   Leighton Ruff, PT, DPT  Acute Rehabilitation Services  Pager: [phone]  Rudean Hitt 02/02/2018, 11:22 AM

## 2018-02-03 DIAGNOSIS — E785 Hyperlipidemia, unspecified: Secondary | ICD-10-CM

## 2018-02-03 DIAGNOSIS — E039 Hypothyroidism, unspecified: Secondary | ICD-10-CM

## 2018-02-03 DIAGNOSIS — I10 Essential (primary) hypertension: Secondary | ICD-10-CM

## 2018-02-03 LAB — GLUCOSE, CAPILLARY
GLUCOSE-CAPILLARY: 87 mg/dL (ref 65–99)
Glucose-Capillary: 72 mg/dL (ref 65–99)
Glucose-Capillary: 130 mg/dL — ABNORMAL HIGH (ref 65–99)
Glucose-Capillary: 103 mg/dL — ABNORMAL HIGH (ref 65–99)

## 2018-02-03 LAB — POTASSIUM
POTASSIUM: 4.9 mmol/L (ref 3.5–5.1)
POTASSIUM: 6 mmol/L — AB (ref 3.5–5.1)
POTASSIUM: 5.6 mmol/L — AB (ref 3.5–5.1)
Potassium: 5.5 mmol/L — ABNORMAL HIGH (ref 3.5–5.1)
Potassium: 6.1 mmol/L — ABNORMAL HIGH (ref 3.5–5.1)
Potassium: 4.9 mmol/L (ref 3.5–5.1)

## 2018-02-03 LAB — RENAL FUNCTION PANEL
Albumin: 2 g/dL — ABNORMAL LOW (ref 3.5–5.0)
Anion gap: 13 (ref 5–15)
BUN: 69 mg/dL — ABNORMAL HIGH (ref 6–20)
CHLORIDE: 95 mmol/L — AB (ref 101–111)
CO2: 23 mmol/L (ref 22–32)
CREATININE: 5.5 mg/dL — AB (ref 0.44–1.00)
Calcium: 6.9 mg/dL — ABNORMAL LOW (ref 8.9–10.3)
GFR calc non Af Amer: 6 mL/min — ABNORMAL LOW (ref >60)
GFR, EST AFRICAN AMERICAN: 7 mL/min — AB (ref >60)
Glucose, Bld: 95 mg/dL (ref 65–99)
Phosphorus: 4.9 mg/dL — ABNORMAL HIGH (ref 2.5–4.6)
Potassium: 5.4 mmol/L — ABNORMAL HIGH (ref 3.5–5.1)
Sodium: 131 mmol/L — ABNORMAL LOW (ref 135–145)

## 2018-02-03 LAB — CBC
HCT: 29.7 % — ABNORMAL LOW (ref 36.0–46.0)
Hemoglobin: 9.5 g/dL — ABNORMAL LOW (ref 12.0–15.0)
MCH: 30.8 pg (ref 26.0–34.0)
MCHC: 32 g/dL (ref 30.0–36.0)
MCV: 96.4 fL (ref 78.0–100.0)
Platelets: 191 10*3/uL (ref 150–400)
RBC: 3.08 MIL/uL — AB (ref 3.87–5.11)
RDW: 13.5 % (ref 11.5–15.5)
WBC: 9.4 10*3/uL (ref 4.0–10.5)

## 2018-02-03 LAB — URINALYSIS, ROUTINE W REFLEX MICROSCOPIC
Bacteria, UA: NONE SEEN
Bilirubin Urine: NEGATIVE
GLUCOSE, UA: 50 mg/dL — AB
Ketones, ur: NEGATIVE mg/dL
Leukocytes, UA: NEGATIVE
Nitrite: NEGATIVE
PH: 9 — AB (ref 5.0–8.0)
Protein, ur: NEGATIVE mg/dL
SPECIFIC GRAVITY, URINE: 1.005 (ref 1.005–1.030)
SQUAMOUS EPITHELIAL / LPF: NONE SEEN

## 2018-02-03 MED ORDER — SODIUM CHLORIDE 0.9 % IV SOLN
INTRAVENOUS | Status: DC
  Administered 2018-02-04: 03:00:00 via INTRAVENOUS

## 2018-02-03 NOTE — Evaluation (Signed)
Occupational Therapy Evaluation Patient Details Name: Melissa Mccullough MRN: [id number] DOB: 1928-11-07 Today's Date: 02/03/2018    History of Present Illness Melissa Mccullough is a 84 y.o. female with medical history significant of type 2 diabetes on oral medication, hypertension, glaucoma, hyperlipidemia, hypothyroidism who comes in with diarrhea for approximately 1 week.   Clinical Impression   This 84 y/o F presents with the above. Pt presenting with cognitive impairments, decreased activity tolerance and dynamic balance, generalized weakness. Pt family reporting Pt resides in [place], was previously ambulating with RW and receiving intermittent supervision for ADL completion. Pt completed stand pivot transfer this session with modA+2 at RW level; currently requires max-total assist for LB and toileting ADLs. Recommend Pt receive SNF level services at time of discharge to maximize her overall safety and independence with ADLs and functional mobility. Will continue to follow acutely to progress Pt towards PLOF.     Follow Up Recommendations  SNF;Supervision/Assistance - 24 hour    Equipment Recommendations  Other (comment)(TBD in next venue )           Precautions / Restrictions Precautions Precautions: Fall Restrictions Weight Bearing Restrictions: No      Mobility Bed Mobility Overal bed mobility: Needs Assistance Bed Mobility: Supine to Sit     Supine to sit: Max assist;+2 for physical assistance;+2 for safety/equipment     General bed mobility comments: assist to bring LEs over EOB and to bring trunk upright into sitting with use of bed pad to scoot hips  Transfers Overall transfer level: Needs assistance Equipment used: Rolling walker (2 wheeled);2 person hand held assist Transfers: Sit to/from [facility] Sit to Stand: Mod assist;+2 safety/equipment;+2 physical assistance Stand pivot transfers: Min assist;+2 physical assistance;+2 safety/equipment        General transfer comment: completed sit<>stand x2 from EOB, initially with 2 person HHA and again at [place]; Pt completing stand pviot to recliner with MinA+2 using RW, verbal cues for initiating task as well as to initiate sitting after approaching chair as Pt attempting to side step past arm of chair     Balance Overall balance assessment: Needs assistance Sitting-balance support: Feet supported Sitting balance-Leahy Scale: Fair Sitting balance - Comments: static sitting EOB    Standing balance support: Bilateral upper extremity supported Standing balance-Leahy Scale: Poor Standing balance comment: reliant on UE support/external support                            ADL either performed or assessed with clinical judgement   ADL Overall ADL's : Needs assistance/impaired Eating/Feeding: Set up;Sitting Eating/Feeding Details (indicate cue type and reason): increased time to open containers, initially required minA to hold cup  Grooming: Wash/dry face;Set up;Sitting   Upper Body Bathing: Minimal assistance;Sitting   Lower Body Bathing: Moderate assistance;+2 for safety/equipment;Sit to/from stand   Upper Body Dressing : Minimal assistance;Sitting Upper Body Dressing Details (indicate cue type and reason): donning second gown  Lower Body Dressing: Maximal assistance;+2 for physical assistance;+2 for safety/equipment;Sit to/from stand   Toilet Transfer: Minimal assistance;+2 for physical assistance;+2 for safety/equipment;Squat-pivot;BSC;RW Toilet Transfer Details (indicate cue type and reason): simulated in transfer to [place] and Hygiene: Total assistance;+2 for physical assistance;Sit to/from stand;+2 for safety/equipment Toileting - Clothing Manipulation Details (indicate cue type and reason): pt with small BM upon standing from EOB; requires total assist to perfor peri-care with additional assist provide standing/steadying assist       Functional  mobility during ADLs: Minimal assistance;+2 for physical assistance;+2 for safety/equipment;Rolling walker General ADL Comments: Pt completing bed mobility and stand pivot to recliner during session, requires increased time and cues to complete tasks, suspect partly due to cognition and partly due to lethargy      Vision Baseline Vision/History: Wears glasses                  Pertinent Vitals/Pain Pain Assessment: Faces Faces Pain Scale: Hurts a little bit Pain Location: bil LEs initially with movement; abdomen  Pain Descriptors / Indicators: Grimacing Pain Intervention(s): Monitored during session          Extremity/Trunk Assessment Upper Extremity Assessment Upper Extremity Assessment: Generalized weakness RUE Deficits / Details: decreased grasp strength and fine motor noted when attempting to hold cup and open containers LUE Deficits / Details: decreased grasp strength and fine motor noted when attempting to hold cup and open containers   Lower Extremity Assessment Lower Extremity Assessment: Defer to PT evaluation            Cognition Arousal/Alertness: Lethargic Behavior During Therapy: Flat affect Overall Cognitive Status: No family/caregiver present to determine baseline cognitive functioning Area of Impairment: Attention;Memory;Safety/judgement;Awareness                   Current Attention Level: Sustained Memory: Decreased short-term memory   Safety/Judgement: Decreased awareness of safety;Decreased awareness of deficits Awareness: Emergent   General Comments: pt requires cues during session to keep eyes open, requires multimodal cues to follow instructions; pt with increased difficulty responding to PLOF questions, intermittently responds to yes/no questions but not able to provide full details                    Home Living Family/patient expects to be discharged to:: Assisted living                                  Additional Comments: per family Pt lives at an ILF; was ambulating with a RW, receiving supervision for bathing but was completing ADLs without physical assist       Prior Functioning/Environment Level of Independence: Independent with assistive device(s)                 OT Problem List: Decreased strength;Decreased range of motion;Impaired balance (sitting and/or standing);Decreased activity tolerance;Decreased cognition      OT Treatment/Interventions: Self-care/ADL training;DME and/or AE instruction;Therapeutic activities;Balance training;Therapeutic exercise;Energy conservation;Patient/family education    OT Goals(Current goals can be found in the care plan section) Acute Rehab OT Goals Patient Stated Goal: none stated at this time  OT Goal Formulation: Patient unable to participate in goal setting Time For Goal Achievement: 02/17/18 Potential to Achieve Goals: Good  OT Frequency: Min 2X/week               Co-evaluation PT/OT/SLP Co-Evaluation/Treatment: Yes Reason for Co-Treatment: For patient/therapist safety;To address functional/ADL transfers   OT goals addressed during session: ADL's and self-care;Proper use of Adaptive equipment and DME      AM-PAC PT "6 Clicks" Daily Activity     Outcome Measure Help from another person eating meals?: A Little Help from another person taking care of personal grooming?: A Little Help from another person toileting, which includes using toliet, bedpan, or urinal?: Total Help from another person bathing (including washing, rinsing, drying)?: A Lot Help from another person to put on and taking off regular upper body clothing?:  A Little Help from another person to put on and taking off regular lower body clothing?: Total 6 Click Score: 13   End of Session Equipment Utilized During Treatment: Rolling walker Nurse Communication: Mobility status  Activity Tolerance: Patient tolerated treatment well Patient left: in chair;with  call bell/phone within reach;with chair alarm set  OT Visit Diagnosis: Unsteadiness on feet (R26.81);Muscle weakness (generalized) (M62.81)                Time: 8333-8329 OT Time Calculation (min): 31 min Charges:  OT General Charges $OT Visit: 1 Visit OT Evaluation $OT Eval Moderate Complexity: 1 Mod G-Codes:     Lou Cal, OT Pager [phone] 02/03/2018   Raymondo Band 02/03/2018, 2:46 PM

## 2018-02-03 NOTE — Progress Notes (Signed)
OT Cancellation Note  Patient Details Name: Melissa Mccullough MRN: [id number] DOB: November 19, 1928   Cancelled Treatment:    Reason Eval/Treat Not Completed: Fatigue/lethargy limiting ability to participate; Pt sleeping soundly upon entering room, unable to arouse despite max attempts from therapist/family. Will check back later today as schedule allows.   Lou Cal, OT Pager [phone] 02/03/2018   Raymondo Band 02/03/2018, 11:13 AM

## 2018-02-03 NOTE — Progress Notes (Signed)
PROGRESS NOTE    Melissa Mccullough  QQI:[id number] DOB: March 11, 1928 DOA: 01/30/2018 PCP: System, Pcp Not In   Brief Narrative: Melissa Mccullough is a 84 y.o. femalewith medical history significant oftype 2 diabetes on oral medication, hypertension, glaucoma, hyperlipidemia, hypothyroidism who comes in with diarrhea for approximately 1 week. Per report of the patient and her family she began to have nausea, vomiting, and diarrhea approximately 8 days ago. She was seen in the ER where she was noted to be dehydrated and treated symptomatically. Per the family, her nausea and vomiting resolved. Her diarrhea continued however. She began to have multiple bowel movements a day. There was never any blood frank blood in the bowel movements. On discussion with the patient, she reports that she had crampy abdominal pain that was intermittent but wax and wane throughout the day. She began to have innumerable bowel movements and family brought her back to the ED at [facility]. In the ED, she was noted to be in renal failure with Cr 4.3 and potassium 7. She was given IV zosyn and oral vanco for concern of infectious colitis. Nephrology was consulted and patient started on IV sodium bicarb. She tested positive for C Diff.    Assessment & Plan:   Principal Problem:   Acute renal insufficiency Active Problems:   Essential hypertension   Hyperlipidemia   Hypothyroidism   Renal failure   Sepsis ([place])   Diarrhea   Pressure injury of skin   Hyperkalemia   Clostridium difficile infection   Palliative care by specialist   Goals of care, counseling/discussion   C. Difficile colitis Appears to be improved. -Continue Vancomycin PO  Acute kidney injury Likely secondary to hypovolemia in setting of significant diarrhea in addition to diuretic/ACEi use. Creatinine improved slightly but has had good urine output. -Nephrology recommendations -IV fluids -BMP in AM  Hyperkalemia Secondary  to kidney injury -Nephology recommendations: [place]  Confusion Unknown etiology. Afebrile. Normal WBC. Patient with underlying progressive memory loss -Urinalysis and urine culture  Diabetes mellitus, type 2 Last Hemoglobin A1C of 7.4% in 2015 -Holding glipizide, metformin and hydrochlorothiazide -Continue SSI  Essential hypertension -amlodipine, lisinopril, hydrochlorothiazide  Hypothyroidism -Continue Synthroid  Hyperlipidemia -Continue Zetia  Cirrhosis Seen on CT abdomen. Outpatient follow-up depending on goals of care   DVT prophylaxis: Heparin Code Status:   Code Status: DNR Family Communication: Son on telephone Disposition Plan: Pending renal improvement vs goals of care   Consultants:   Nephrology  Procedures:   None  Antimicrobials:  Vancomycin    Subjective: No pain. States that she has died.  Objective: Vitals:   02/03/2018 0945 2018/02/03 1353 02/03/18 2139 02/03/18 0615  BP:  (!) 132/49 (!) 152/49 (!) 145/58  Pulse:  85 83 70  Resp:  16 16 12   Temp:  98.1 F (36.7 C) 97.8 F (36.6 C) 97.8 F (36.6 C)  TempSrc:  Oral Oral Oral  SpO2: 94% 92% 95% 92%  Weight:      Height:        Intake/Output Summary (Last 24 hours) at 02/03/2018 1508 Last data filed at 02/03/2018 0743 Gross per 24 hour  Intake 2075 ml  Output 3520 ml  Net -1445 ml   Filed Weights   01/30/18 1222 01/31/18 0620  Weight: 54.4 kg (120 lb) 75.2 kg (165 lb 12.6 oz)    Examination:  General exam: Appears calm and comfortable Respiratory system: Clear to auscultation. Respiratory effort normal. Cardiovascular system: S1 & S2 heard, RRR. No  murmurs. Gastrointestinal system: Abdomen is nondistended, soft and nontender. No organomegaly or masses felt. Normal bowel sounds heard. Central nervous system: Alert and oriented to person. Extremities: No edema. No calf tenderness Skin: No cyanosis. No rashes Psychiatry: Judgement and insight appear impaired. States that she has  died and is currently not alive.     Data Reviewed: I have personally reviewed following labs and imaging studies  CBC: Recent Labs  Lab 01/30/18 1426 01/31/18 0241 02/01/18 0205 02/02/18 0220 02/03/18 0425  WBC 14.5* 12.5* 10.0 10.8* 9.4  NEUTROABS 10.3*  --   --   --   --   HGB 11.9* 10.3* 10.0* 11.3* 9.5*  HCT 36.4 31.3* 30.0* 34.0* 29.7*  MCV 98.6 96.9 94.9 96.0 96.4  PLT 246 225 207 204 607   Basic Metabolic Panel: Recent Labs  Lab 01/31/18 0241 01/31/18 0850  02/01/18 0205 02/02/18 0220  02/02/18 1531 02/02/18 1853 02/03/18 0425 02/03/18 0654 02/03/18 1151  NA 135 135  --  133* 129*  --  129*  --  131*  --   --   K 6.3* 6.0*   < > 4.8 6.1*   < > 5.5*  5.4* 5.6* 4.9  5.4* 5.5* 6.1*  CL 103 100*  --  74* 86*  --  88*  --  95*  --   --   CO2 20* 21*  --  46* 27  --  26  --  23  --   --   GLUCOSE 48* 79  --  72 162*  --  100*  --  95  --   --   BUN 85* 84*  --  64* 81*  --  78*  --  69*  --   --   CREATININE 4.25* 4.54*  --  3.96* 5.72*  --  6.07*  --  5.50*  --   --   CALCIUM 8.5* 8.6*  --  6.4* 8.1*  --  7.9*  --  6.9*  --   --   PHOS 4.4  --   --  3.2 4.6  --  4.9*  --  4.9*  --   --    < > = values in this interval not displayed.   GFR: Estimated Creatinine Clearance: 6.5 mL/min (A) (by C-G formula based on SCr of 5.5 mg/dL (H)). Liver Function Tests: Recent Labs  Lab 01/30/18 1426 01/31/18 0241 02/01/18 0205 02/02/18 0220 02/02/18 1531 02/03/18 0425  AST 29 31 29   --   --   --   ALT 26 19 18   --   --   --   ALKPHOS 64 48 46  --   --   --   BILITOT 0.6 0.7 0.7  --   --   --   PROT 7.2 5.6* 4.8*  --   --   --   ALBUMIN 3.1* 2.2* 1.9*  1.9* 2.3* 2.1* 2.0*   Recent Labs  Lab 01/30/18 1426  LIPASE 48   No results for input(s): AMMONIA in the last 168 hours. Coagulation Profile: No results for input(s): INR, PROTIME in the last 168 hours. Cardiac Enzymes: No results for input(s): CKTOTAL, CKMB, CKMBINDEX, TROPONINI in the last 168 hours. BNP  (last 3 results) No results for input(s): PROBNP in the last 8760 hours. HbA1C: No results for input(s): HGBA1C in the last 72 hours. CBG: Recent Labs  Lab 02/02/18 1220 02/02/18 1708 02/02/18 2128 02/03/18 0831 02/03/18 1225  GLUCAP 103* 91 95 87  72   Lipid Profile: No results for input(s): CHOL, HDL, LDLCALC, TRIG, CHOLHDL, LDLDIRECT in the last 72 hours. Thyroid Function Tests: No results for input(s): TSH, T4TOTAL, FREET4, T3FREE, THYROIDAB in the last 72 hours. Anemia Panel: No results for input(s): VITAMINB12, FOLATE, FERRITIN, TIBC, IRON, RETICCTPCT in the last 72 hours. Sepsis Labs: Recent Labs  Lab 01/30/18 1646 01/30/18 1828 01/31/18 1426 01/31/18 1735  LATICACIDVEN 3.44* 4.60* 2.6* 2.4*    Recent Results (from the past 240 hour(s))  Blood Culture (routine x 2)     Status: None (Preliminary result)   Collection Time: 01/30/18  4:32 PM  Result Value Ref Range Status   Specimen Description BLOOD RIGHT WRIST  Final   Special Requests   Final    BOTTLES DRAWN AEROBIC AND ANAEROBIC Blood Culture adequate volume Performed at [facility], [address] [place], [place] [address]    Culture   Final    NO GROWTH 4 DAYS Performed at [facility], [place] [address], [place], [place] [address]    Report Status PENDING  Incomplete  Blood Culture (routine x 2)     Status: None (Preliminary result)   Collection Time: 01/30/18  4:36 PM  Result Value Ref Range Status   Specimen Description BLOOD RIGHT HAND  Final   Special Requests   Final    BOTTLES DRAWN AEROBIC AND ANAEROBIC Blood Culture adequate volume Performed at [facility], [address] [place], [place] [address]    Culture   Final    NO GROWTH 4 DAYS Performed at [facility], [place] [address] [place], [place] [address]    Report Status PENDING  Incomplete  MRSA PCR Screening     Status: None   Collection Time: 01/30/18 10:16 PM  Result Value Ref Range Status   MRSA  by PCR NEGATIVE NEGATIVE Final    Comment:        The GeneXpert MRSA Assay (FDA approved for NASAL specimens only), is one component of a comprehensive MRSA colonization surveillance program. It is not intended to diagnose MRSA infection nor to guide or monitor treatment for MRSA infections. Performed at [facility], [place] [address] [place], [place] [address]   Gastrointestinal Panel by PCR , Stool     Status: None   Collection Time: 01/31/18  9:35 AM  Result Value Ref Range Status   Campylobacter species NOT DETECTED NOT DETECTED Final   Plesimonas shigelloides NOT DETECTED NOT DETECTED Final   Salmonella species NOT DETECTED NOT DETECTED Final   Yersinia enterocolitica NOT DETECTED NOT DETECTED Final   Vibrio species NOT DETECTED NOT DETECTED Final   Vibrio cholerae NOT DETECTED NOT DETECTED Final   Enteroaggregative E coli (EAEC) NOT DETECTED NOT DETECTED Final   Enteropathogenic E coli (EPEC) NOT DETECTED NOT DETECTED Final   Enterotoxigenic E coli (ETEC) NOT DETECTED NOT DETECTED Final   Shiga like toxin producing E coli (STEC) NOT DETECTED NOT DETECTED Final   Shigella/Enteroinvasive E coli (EIEC) NOT DETECTED NOT DETECTED Final   Cryptosporidium NOT DETECTED NOT DETECTED Final   Cyclospora cayetanensis NOT DETECTED NOT DETECTED Final   Entamoeba histolytica NOT DETECTED NOT DETECTED Final   Giardia lamblia NOT DETECTED NOT DETECTED Final   Adenovirus F40/41 NOT DETECTED NOT DETECTED Final   Astrovirus NOT DETECTED NOT DETECTED Final   Norovirus GI/GII NOT DETECTED NOT DETECTED Final   Rotavirus A NOT DETECTED NOT DETECTED Final   Sapovirus (  I, II, IV, and V) NOT DETECTED NOT DETECTED Final    Comment: Performed at [facility], [address] [place], [place] [address]  C difficile quick scan w PCR reflex     Status: Abnormal   Collection Time: 01/31/18  9:36 AM  Result Value Ref Range Status   C Diff antigen POSITIVE (A) NEGATIVE Final   C Diff toxin  NEGATIVE NEGATIVE Final   C Diff interpretation Results are indeterminate. See PCR results.  Final    Comment: Performed at [facility], [place] [address], [place], [place] [address]  C. Diff by PCR, Reflexed     Status: Abnormal   Collection Time: 01/31/18  9:36 AM  Result Value Ref Range Status   Toxigenic C. Difficile by PCR POSITIVE (A) NEGATIVE Final    Comment: Positive for toxigenic C. difficile with little to no toxin production. Only treat if clinical presentation suggests symptomatic illness. Performed at [facility], [place] [address] [place], [place] [address]          Radiology Studies: US Renal  Result Date: 02/02/2018 CLINICAL DATA:  Acute kidney injury. EXAM: RENAL / URINARY TRACT ULTRASOUND COMPLETE COMPARISON:  CT scan of January 30, 2018. FINDINGS: Right Kidney: Length: 10.4 cm. 7 mm cyst is seen in upper pole. Echogenicity within normal limits. No mass or hydronephrosis visualized. Left Kidney: Length: 10.9 cm. 1 cm cyst is seen in midpole. Echogenicity within normal limits. No mass or hydronephrosis visualized. Bladder: Nondistended secondary to Foley catheter. IMPRESSION: Bilateral simple renal cysts.  No other renal abnormality is noted. Electronically Signed   By: Marijo Conception, M.D.   On: 02/02/2018 13:44        Scheduled Meds: . cholecalciferol  1,000 Units Oral Daily  . cycloSPORINE  1 drop Both Eyes BID  . dorzolamide-timolol  1 drop Both Eyes BID  . ezetimibe  10 mg Oral Daily  . gabapentin  100 mg Oral QHS  . heparin  5,000 Units Subcutaneous Q8H  . insulin aspart  0-9 Units Subcutaneous TID WC  . latanoprost  1 drop Both Eyes QHS  . levothyroxine  25 mcg Oral QAC breakfast  . LORazepam  0.5 mg Oral BID  . omega-3 acid ethyl esters  1 g Oral BID  . patiromer  16.8 g Oral Daily  . sodium chloride flush  3 mL Intravenous Q12H  . vancomycin  125 mg Oral Q6H  . vitamin C  1,000 mg Oral Daily  . vitamin E  400 Units Oral Daily    Continuous Infusions: . sodium chloride 100 mL/hr at 02/03/18 0524     LOS: 4 days     Cordelia Poche, MD [facility] 02/03/2018, 3:08 PM Pager: [phone]  If 7PM-7AM, please contact night-coverage [email or web address] Password [email or web address] 02/03/2018, 3:08 PM

## 2018-02-03 NOTE — Evaluation (Signed)
Physical Therapy Evaluation Patient Details Name: Melissa Mccullough MRN: [id number] DOB: [birth year] Today's Date: 02/03/2018   History of Present Illness  Melissa Mccullough is a 84 y.o. female with medical history significant of type 2 diabetes on oral medication, hypertension, glaucoma, hyperlipidemia, hypothyroidism who comes in with diarrhea for approximately 1 week.  Clinical Impression  Pt mobility greatly limited by lethargy and confusion. Per family pt was indep with rollator and ADLS at [place] facility. Pt now requires mod/maxA for transfers and OOB mobility. Acute PT to con't to follow to progress mobility.    Follow Up Recommendations SNF;Supervision/Assistance - 24 hour    Equipment Recommendations  None recommended by PT    Recommendations for Other Services       Precautions / Restrictions Precautions Precautions: Fall Restrictions Weight Bearing Restrictions: No      Mobility  Bed Mobility Overal bed mobility: Needs Assistance Bed Mobility: Supine to Sit     Supine to sit: Max assist;+2 for physical assistance;+2 for safety/equipment     General bed mobility comments: pt with no initiation, maxAx2 for trunk elevation and to bring LEs off EOB. Pt able to sit EOB with supervision x 10 min  Transfers Overall transfer level: Needs assistance Equipment used: Rolling walker (2 wheeled);2 person hand held assist Transfers: Sit to/from [facility] Sit to Stand: Mod assist;+2 safety/equipment;+2 physical assistance Stand pivot transfers: Min assist;+2 physical assistance;+2 safety/equipment       General transfer comment: pt with stool incontinence. Pt able to stand with minA for hygiene  Ambulation/Gait                Stairs            Wheelchair Mobility    Modified Rankin (Stroke Patients Only)       Balance Overall balance assessment: Needs assistance Sitting-balance support: Feet supported Sitting balance-Leahy Scale:  Fair Sitting balance - Comments: static sitting EOB    Standing balance support: Bilateral upper extremity supported Standing balance-Leahy Scale: Poor Standing balance comment: reliant on UE support/external support                              Pertinent Vitals/Pain Pain Assessment: Faces Faces Pain Scale: Hurts a little bit Pain Location: bil LEs initially with movement; abdomen  Pain Descriptors / Indicators: Grimacing Pain Intervention(s): Monitored during session    Home Living Family/patient expects to be discharged to:: Assisted living                 Additional Comments: per family Pt lives at an [place]; was ambulating with a RW, receiving supervision for bathing but was completing ADLs without physical assist     Prior Function Level of Independence: Independent with assistive device(s)               Hand Dominance        Extremity/Trunk Assessment   Upper Extremity Assessment Upper Extremity Assessment: Defer to OT evaluation RUE Deficits / Details: decreased grasp strength and fine motor noted when attempting to hold cup and open containers LUE Deficits / Details: decreased grasp strength and fine motor noted when attempting to hold cup and open containers    Lower Extremity Assessment Lower Extremity Assessment: Generalized weakness    Cervical / Trunk Assessment Cervical / Trunk Assessment: Normal  Communication   Communication: No difficulties  Cognition Arousal/Alertness: Lethargic Behavior During Therapy: Flat affect(can become slighly agitated with  questions) Overall Cognitive Status: No family/caregiver present to determine baseline cognitive functioning Area of Impairment: Attention;Memory;Safety/judgement;Awareness                   Current Attention Level: Sustained Memory: Decreased short-term memory   Safety/Judgement: Decreased awareness of safety;Decreased awareness of deficits Awareness: Emergent   General  Comments: pt requires cues during session to keep eyes open, requires multimodal cues to follow instructions; pt with increased difficulty responding to PLOF questions, intermittently responds to yes/no questions but not able to provide full details      General Comments      Exercises     Assessment/Plan    PT Assessment Patient needs continued PT services  PT Problem List Decreased strength;Decreased range of motion;Decreased activity tolerance;Decreased balance;Decreased mobility;Decreased coordination;Decreased cognition;Decreased knowledge of use of DME;Decreased safety awareness       PT Treatment Interventions DME instruction;Gait training;Stair training;Functional mobility training;Therapeutic activities;Therapeutic exercise;Balance training;Neuromuscular re-education    PT Goals (Current goals can be found in the Care Plan section)  Acute Rehab PT Goals Patient Stated Goal: none stated at this time  PT Goal Formulation: Patient unable to participate in goal setting Time For Goal Achievement: 02/17/18 Potential to Achieve Goals: Good    Frequency Min 2X/week   Barriers to discharge Decreased caregiver support needs SNF level    Co-evaluation PT/OT/SLP Co-Evaluation/Treatment: Yes Reason for Co-Treatment: For patient/therapist safety PT goals addressed during session: Mobility/safety with mobility OT goals addressed during session: ADL's and self-care;Proper use of Adaptive equipment and DME       AM-PAC PT "6 Clicks" Daily Activity  Outcome Measure Difficulty turning over in bed (including adjusting bedclothes, sheets and blankets)?: Unable Difficulty moving from lying on back to sitting on the side of the bed? : Unable Difficulty sitting down on and standing up from a chair with arms (e.g., wheelchair, bedside commode, etc,.)?: Unable Help needed moving to and from a bed to chair (including a wheelchair)?: A Lot Help needed walking in hospital room?: A Lot Help  needed climbing 3-5 steps with a railing? : Total 6 Click Score: 8    End of Session   Activity Tolerance: Patient limited by lethargy Patient left: in chair;with call bell/phone within reach;with chair alarm set Nurse Communication: Mobility status PT Visit Diagnosis: Unsteadiness on feet (R26.81);Muscle weakness (generalized) (M62.81)    Time: 2595-6387 PT Time Calculation (min) (ACUTE ONLY): 26 min   Charges:   PT Evaluation $PT Eval Moderate Complexity: 1 Mod     PT G Codes:        Kittie Plater, PT, DPT Pager #: [phone] Office #: [phone]   [place] 02/03/2018, 3:17 PM

## 2018-02-03 NOTE — Progress Notes (Signed)
Admit: 01/30/2018 LOS: 4  76F with AKI 2/2 hypovolemia (CDI) + ACEi + Diuretic  Subjective:  Pt confused this AM, repeats same thing Not having copious stools Ate some breakfast with daughter visiting On IVFs Excellent UOP with foley in place On [place] Renal US w/o acute structural issues Palliative care following, note reviewed   02/19 0701 - 02/20 0700 In: 2138 [P.O.:60; I.V.:2078] Out: 3130 [Urine:3130]  Filed Weights   01/30/18 1222 01/31/18 0620  Weight: 54.4 kg (120 lb) 75.2 kg (165 lb 12.6 oz)    Scheduled Meds: . cholecalciferol  1,000 Units Oral Daily  . cycloSPORINE  1 drop Both Eyes BID  . dorzolamide-timolol  1 drop Both Eyes BID  . ezetimibe  10 mg Oral Daily  . gabapentin  100 mg Oral QHS  . heparin  5,000 Units Subcutaneous Q8H  . insulin aspart  0-9 Units Subcutaneous TID WC  . latanoprost  1 drop Both Eyes QHS  . levothyroxine  25 mcg Oral QAC breakfast  . LORazepam  0.5 mg Oral BID  . omega-3 acid ethyl esters  1 g Oral BID  . patiromer  16.8 g Oral Daily  . sodium chloride flush  3 mL Intravenous Q12H  . vancomycin  125 mg Oral Q6H  . vitamin C  1,000 mg Oral Daily  . vitamin E  400 Units Oral Daily   Continuous Infusions: . sodium chloride 100 mL/hr at 02/03/18 0524   PRN Meds:.acetaminophen **OR** acetaminophen, HYDROcodone-acetaminophen, ondansetron **OR** ondansetron (ZOFRAN) IV  Current Labs: reviewed   Physical Exam:  Blood pressure (!) 145/58, pulse 70, temperature 97.8 F (36.6 C), temperature source Oral, resp. rate 12, height 5' (1.524 m), weight 75.2 kg (165 lb 12.6 oz), SpO2 92 %. RRR CTAB No LEE S/nt/nd, +BS No rashes/lesions EOMI NCAT Foley catheter in place  A 1. AKI, normal baseline 2/2 hypovolemia (CDI + thiazide) + ACEi use; worsening in the last 24 hours, unclear why: not obstructed, good UOP.  2. Hyperkalemia on Patiromer, no med explanation, unclear why 3. CDI on Vanco per TRH; stool frequency/volume  improving 4. DM2 holding metformin 5. Memory loss / Delirium  P 1. No obvious explanation for why she has the recent worsening in her renal function and persistent hyperkalemia 2. At this point I would suggest ongoing supportive care with hydration, recognizing the goals set by the family with palliative care yesterday 3. Daily weights, Daily Renal Panel, Strict I/Os, Avoid nephrotoxins (NSAIDs, judicious IV Contrast) 4. Will cont to follow   Pearson Grippe MD 02/03/2018, 12:54 PM  Recent Labs  Lab 02/02/18 0220  02/02/18 1531  02/03/18 0425 02/03/18 0654 02/03/18 1151  NA 129*  --  129*  --  131*  --   --   K 6.1*   < > 5.5*  5.4*   < > 4.9  5.4* 5.5* 6.1*  CL 86*  --  88*  --  95*  --   --   CO2 27  --  26  --  23  --   --   GLUCOSE 162*  --  100*  --  95  --   --   BUN 81*  --  78*  --  69*  --   --   CREATININE 5.72*  --  6.07*  --  5.50*  --   --   CALCIUM 8.1*  --  7.9*  --  6.9*  --   --   PHOS 4.6  --  4.9*  --  4.9*  --   --    < > = values in this interval not displayed.   Recent Labs  Lab 01/30/18 1426  02/01/18 0205 02/02/18 0220 02/03/18 0425  WBC 14.5*   < > 10.0 10.8* 9.4  NEUTROABS 10.3*  --   --   --   --   HGB 11.9*   < > 10.0* 11.3* 9.5*  HCT 36.4   < > 30.0* 34.0* 29.7*  MCV 98.6   < > 94.9 96.0 96.4  PLT 246   < > 207 204 191   < > = values in this interval not displayed.

## 2018-02-04 DIAGNOSIS — N179 Acute kidney failure, unspecified: Secondary | ICD-10-CM

## 2018-02-04 LAB — RENAL FUNCTION PANEL
Albumin: 2.4 g/dL — ABNORMAL LOW (ref 3.5–5.0)
Anion gap: 16 — ABNORMAL HIGH (ref 5–15)
BUN: 75 mg/dL — ABNORMAL HIGH (ref 6–20)
CO2: 21 mmol/L — AB (ref 22–32)
Calcium: 8 mg/dL — ABNORMAL LOW (ref 8.9–10.3)
Chloride: 95 mmol/L — ABNORMAL LOW (ref 101–111)
Creatinine, Ser: 6.46 mg/dL — ABNORMAL HIGH (ref 0.44–1.00)
GFR calc non Af Amer: 5 mL/min — ABNORMAL LOW (ref >60)
GFR, EST AFRICAN AMERICAN: 6 mL/min — AB (ref >60)
Glucose, Bld: 85 mg/dL (ref 65–99)
POTASSIUM: 5.3 mmol/L — AB (ref 3.5–5.1)
Phosphorus: 6 mg/dL — ABNORMAL HIGH (ref 2.5–4.6)
Sodium: 132 mmol/L — ABNORMAL LOW (ref 135–145)

## 2018-02-04 LAB — POTASSIUM
POTASSIUM: 5.2 mmol/L — AB (ref 3.5–5.1)
Potassium: 4.8 mmol/L (ref 3.5–5.1)

## 2018-02-04 LAB — GLUCOSE, CAPILLARY
GLUCOSE-CAPILLARY: 90 mg/dL (ref 65–99)
GLUCOSE-CAPILLARY: 94 mg/dL (ref 65–99)
Glucose-Capillary: 100 mg/dL — ABNORMAL HIGH (ref 65–99)
Glucose-Capillary: 112 mg/dL — ABNORMAL HIGH (ref 65–99)
Glucose-Capillary: 143 mg/dL — ABNORMAL HIGH (ref 65–99)

## 2018-02-04 LAB — CULTURE, BLOOD (ROUTINE X 2)
CULTURE: NO GROWTH
SPECIAL REQUESTS: ADEQUATE

## 2018-02-04 MED ORDER — LORAZEPAM 0.5 MG PO TABS: 0.5000 mg | ORAL_TABLET | Freq: Every evening | ORAL | Status: DC | PRN

## 2018-02-04 NOTE — Clinical Social Work Note (Signed)
Clinical Social Work Assessment  Patient Details  Name: Melissa Mccullough MRN: [id number] Date of Birth: 12-03-1928  Date of referral:  02/04/18               Reason for consult:  Facility Placement                Permission sought to share information with:  Chartered certified accountant granted to share information::  Yes, Verbal Permission Granted  Name::     Education officer, environmental::  SNF  Relationship::  son  Contact Information:     Housing/Transportation Living arrangements for the past 2 months:  [place] of Information:  Patient, Adult Children Patient Interpreter Needed:  None Criminal Activity/Legal Involvement Pertinent to Current Situation/Hospitalization:  No - Comment as needed Significant Relationships:  Adult Children Lives with:  Self Do you feel safe going back to the place where you live?  No Need for family participation in patient care:  Yes (Comment)(help with decision making)  Care giving concerns:  Pt lives at [facility] where she was getting around with assistive devices- now pt very weak and cannot walk without assistance would not be safe to go home alone.   Social Worker assessment / plan:  CSW spoke with pt first concerning PT recommendation for SNF.  Patient alert and answered questions appropriately- feels as if she might need SNF at [place] understands shes too weak to return home at this time.  Patient not listed as oriented fully so requested permission to speak with family- pt would like for CSW to check with son, [name], to discuss further.  CSW spoke with [name] and explained recommendation for SNF.  Employment status:  Retired Nurse, adult PT Recommendations:  [place] / Referral to community resources:  [place]  Patient/Family's Response to care:  [name] is agreeable to SNF if that is appropriate for patient.  Wants to hear what nephrologist  says before making final decision- was given the impress she might be declining due to renal function.  Understands that pt is not safe to return home and is willing to consider all alternative options based on her needs (SNF, ALF, hospice)  Patient/Family's Understanding of and Emotional Response to Diagnosis, Current Treatment, and Prognosis:  Son very open and realistic about pt condition and possible decline.  Son aware that pt is not doing well and acknowledges that pt is ready to die but that it might not be imminent.  Emotional Assessment Appearance:  Appears stated age Attitude/Demeanor/Rapport:    Affect (typically observed):  Accepting, Appropriate Orientation:  Oriented to Self, Oriented to Place, Oriented to Situation Alcohol / Substance use:  Not Applicable Psych involvement (Current and /or in the community):  No (Comment)  Discharge Needs  Concerns to be addressed:  Care Coordination Readmission within the last 30 days:  No Current discharge risk:  Physical Impairment Barriers to Discharge:  Continued Medical Work up   Jorge Ny, LCSW 02/04/2018, 2:50 PM

## 2018-02-04 NOTE — Progress Notes (Signed)
Admit: 01/30/2018 LOS: 5  Melissa Mccullough with AKI 2/2 hypovolemia (CDI) + ACEi + Diuretic  Subjective:  Pt much more awake, appropriate, and interactive this AM Discussed with 3 children; who in unison want to fix treatable issues but not take on aggressive/heroic measures Discussed the uncertainty regarding patient's renal failure; urine output has increased significantly but creatinine continues to worsen. Confirmed and agreed that patient should not be a candidate for hemodialysis   02/20 0701 - 02/21 0700 In: 1158.3 [I.V.:1158.3] Out: 5740 [Urine:5740]  Filed Weights   01/30/18 1222 01/31/18 0620  Weight: 54.4 kg (120 lb) 75.2 kg (165 lb 12.6 oz)    Scheduled Meds: . cholecalciferol  1,000 Units Oral Daily  . cycloSPORINE  1 drop Both Eyes BID  . dorzolamide-timolol  1 drop Both Eyes BID  . ezetimibe  10 mg Oral Daily  . gabapentin  100 mg Oral QHS  . heparin  5,000 Units Subcutaneous Q8H  . insulin aspart  0-9 Units Subcutaneous TID WC  . latanoprost  1 drop Both Eyes QHS  . levothyroxine  25 mcg Oral QAC breakfast  . LORazepam  0.5 mg Oral BID  . omega-3 acid ethyl esters  1 g Oral BID  . patiromer  16.8 g Oral Daily  . sodium chloride flush  3 mL Intravenous Q12H  . vancomycin  125 mg Oral Q6H  . vitamin C  1,000 mg Oral Daily  . vitamin E  400 Units Oral Daily   Continuous Infusions: . sodium chloride 100 mL/hr at 02/04/18 0257   PRN Meds:.acetaminophen **OR** acetaminophen, HYDROcodone-acetaminophen, ondansetron **OR** ondansetron (ZOFRAN) IV  Current Labs: reviewed   Physical Exam:  Blood pressure (!) 154/96, pulse 82, temperature (!) 97.3 F (36.3 C), temperature source Oral, resp. rate 16, height 5' (1.524 m), weight 75.2 kg (165 lb 12.6 oz), SpO2 96 %. RRR CTAB No LEE S/nt/nd, +BS No rashes/lesions EOMI NCAT Foley catheter in place  A 1. AKI, normal baseline 2/2 hypovolemia (CDI + thiazide) + ACEi use; worsening in the last 48 hours, unclear why: not  obstructed, good UOP.  2. Hyperkalemia on Patiromer, no med explanation 3. CDI on Vanco per TRH; stool frequency/volume improving 4. DM2 holding metformin 5. Memory loss / Delirium; much more cogent today  P 1. No obvious explanation for why she has the recent worsening in her renal function and persistent hyperkalemia 2. At this point I would suggest ongoing supportive care with hydration, recognizing the goals set by the family with palliative care yesterday 3. Discussed with the family that we will monitor renal function for another 24 hours given how much improvement we see in her mental status and overall health today. 4. Daily weights, Daily Renal Panel, Strict I/Os, Avoid nephrotoxins (NSAIDs, judicious IV Contrast) 5. Will cont to follow   Pearson Grippe MD 02/04/2018, 11:58 AM  Recent Labs  Lab 02/02/18 1531  02/03/18 0425  02/03/18 2242 02/04/18 0321 02/04/18 0645  NA 129*  --  131*  --   --   --  132*  K 5.5*  5.4*   < > 4.9  5.4*   < > 5.6* 5.2* 5.3*  CL 88*  --  95*  --   --   --  95*  CO2 26  --  23  --   --   --  21*  GLUCOSE 100*  --  95  --   --   --  85  BUN 78*  --  69*  --   --   --  75*  CREATININE 6.07*  --  5.50*  --   --   --  6.46*  CALCIUM 7.9*  --  6.9*  --   --   --  8.0*  PHOS 4.9*  --  4.9*  --   --   --  6.0*   < > = values in this interval not displayed.   Recent Labs  Lab 01/30/18 1426  02/01/18 0205 02/02/18 0220 02/03/18 0425  WBC 14.5*   < > 10.0 10.8* 9.4  NEUTROABS 10.3*  --   --   --   --   HGB 11.9*   < > 10.0* 11.3* 9.5*  HCT 36.4   < > 30.0* 34.0* 29.7*  MCV 98.6   < > 94.9 96.0 96.4  PLT 246   < > 207 204 191   < > = values in this interval not displayed.

## 2018-02-04 NOTE — Progress Notes (Signed)
PROGRESS NOTE    Melissa Mccullough  HEN:[id number] DOB: 1928/08/13 DOA: 01/30/2018 PCP: System, Pcp Not In   Brief Narrative: Melissa Mccullough is a 84 y.o. femalewith medical history significant oftype 2 diabetes on oral medication, hypertension, glaucoma, hyperlipidemia, hypothyroidism who comes in with diarrhea for approximately 1 week. Per report of the patient and her family she began to have nausea, vomiting, and diarrhea approximately 8 days ago. She was seen in the ER where she was noted to be dehydrated and treated symptomatically. Per the family, her nausea and vomiting resolved. Her diarrhea continued however. She began to have multiple bowel movements a day. There was never any blood frank blood in the bowel movements. On discussion with the patient, she reports that she had crampy abdominal pain that was intermittent but wax and wane throughout the day. She began to have innumerable bowel movements and family brought her back to the ED at [facility]. In the ED, she was noted to be in renal failure with Cr 4.3 and potassium 7. She was given IV zosyn and oral vanco for concern of infectious colitis. Nephrology was consulted and patient started on IV sodium bicarb. She tested positive for C Diff. Urine output has significantly improved with continued worsening of creatinine.   Assessment & Plan:   Principal Problem:   Acute renal insufficiency Active Problems:   Essential hypertension   Hyperlipidemia   Hypothyroidism   Renal failure   Sepsis ([place])   Diarrhea   Pressure injury of skin   Hyperkalemia   Clostridium difficile infection   Palliative care by specialist   Goals of care, counseling/discussion   C. Difficile colitis Appears to be improved. -Continue Vancomycin PO  Acute kidney injury Likely secondary to hypovolemia in setting of significant diarrhea in addition to diuretic/ACEi use. Creatinine worsened but has had excellent urine  output. -Nephrology recommendations -IV fluids -BMP in AM  Hyperkalemia Secondary to kidney injury -Nephology recommendations: [place]  Confusion Unknown etiology. Afebrile. Normal WBC. Patient with underlying progressive memory loss. Improved today. Urinalysis does not suggest infection.  Diabetes mellitus, type 2 Last Hemoglobin A1C of 7.4% in 2015. Currently well controlled. -Holding glipizide, metformin and hydrochlorothiazide -Continue SSI  Essential hypertension -Continue to hold amlodipine, lisinopril, hydrochlorothiazide  Hypothyroidism -Continue Synthroid  Hyperlipidemia -Continue Zetia  Cirrhosis Seen on CT abdomen. Outpatient follow-up depending on goals of care   DVT prophylaxis: Heparin Code Status:   Code Status: DNR Family Communication: Son, and two daughters at bedside Disposition Plan: Pending renal improvement vs goals of care   Consultants:   Nephrology  Palliative care medicine  Procedures:   None  Antimicrobials:  Vancomycin    Subjective: No concerns overnight. Did not sleep well overnight.  Objective: Vitals:   02/03/18 0615 02/03/18 1612 02/03/18 2209 02/04/18 0500  BP: (!) 145/58 (!) 158/59 (!) 165/58 (!) 163/69  Pulse: 70 63 79   Resp: 12 16 16    Temp: 97.8 F (36.6 C) 97.9 F (36.6 C) 98.2 F (36.8 C) 98.4 F (36.9 C)  TempSrc: Oral  Axillary Oral  SpO2: 92%     Weight:      Height:        Intake/Output Summary (Last 24 hours) at 02/04/2018 0920 Last data filed at 02/04/2018 0718 Gross per 24 hour  Intake 1158.33 ml  Output 5050 ml  Net -3891.67 ml   Filed Weights   01/30/18 1222 01/31/18 0620  Weight: 54.4 kg (120 lb) 75.2 kg (165 lb  12.6 oz)    Examination:  General exam: Appears calm and comfortable Respiratory system: Clear to auscultation. Respiratory effort normal. Cardiovascular system: S1 & S2 heard, RRR. No murmurs. Gastrointestinal system: Abdomen is nondistended, soft and nontender. No  organomegaly or masses felt. Normal bowel sounds heard. Central nervous system: Alert and oriented to person. Extremities: No pitting edema. No calf tenderness Skin: No cyanosis. No rashes Psychiatry: Judgement and insight appear impaired.     Data Reviewed: I have personally reviewed following labs and imaging studies  CBC: Recent Labs  Lab 01/30/18 1426 01/31/18 0241 02/01/18 0205 02/02/18 0220 02/03/18 0425  WBC 14.5* 12.5* 10.0 10.8* 9.4  NEUTROABS 10.3*  --   --   --   --   HGB 11.9* 10.3* 10.0* 11.3* 9.5*  HCT 36.4 31.3* 30.0* 34.0* 29.7*  MCV 98.6 96.9 94.9 96.0 96.4  PLT 246 225 207 204 062   Basic Metabolic Panel: Recent Labs  Lab 02/01/18 0205 02/02/18 0220  02/02/18 1531  02/03/18 0425  02/03/18 1514 02/03/18 1851 02/03/18 2242 02/04/18 0321 02/04/18 0645  NA 133* 129*  --  129*  --  131*  --   --   --   --   --  132*  K 4.8 6.1*   < > 5.5*  5.4*   < > 4.9  5.4*   < > 4.9 6.0* 5.6* 5.2* 5.3*  CL 74* 86*  --  88*  --  95*  --   --   --   --   --  95*  CO2 46* 27  --  26  --  23  --   --   --   --   --  21*  GLUCOSE 72 162*  --  100*  --  95  --   --   --   --   --  85  BUN 64* 81*  --  78*  --  69*  --   --   --   --   --  75*  CREATININE 3.96* 5.72*  --  6.07*  --  5.50*  --   --   --   --   --  6.46*  CALCIUM 6.4* 8.1*  --  7.9*  --  6.9*  --   --   --   --   --  8.0*  PHOS 3.2 4.6  --  4.9*  --  4.9*  --   --   --   --   --  6.0*   < > = values in this interval not displayed.   GFR: Estimated Creatinine Clearance: 5.6 mL/min (A) (by C-G formula based on SCr of 6.46 mg/dL (H)). Liver Function Tests: Recent Labs  Lab 01/30/18 1426 01/31/18 0241 02/01/18 0205 02/02/18 0220 02/02/18 1531 02/03/18 0425 02/04/18 0645  AST 29 31 29   --   --   --   --   ALT 26 19 18   --   --   --   --   ALKPHOS 64 48 46  --   --   --   --   BILITOT 0.6 0.7 0.7  --   --   --   --   PROT 7.2 5.6* 4.8*  --   --   --   --   ALBUMIN 3.1* 2.2* 1.9*  1.9* 2.3* 2.1*  2.0* 2.4*   Recent Labs  Lab 01/30/18 1426  LIPASE 48   No results for  input(s): AMMONIA in the last 168 hours. Coagulation Profile: No results for input(s): INR, PROTIME in the last 168 hours. Cardiac Enzymes: No results for input(s): CKTOTAL, CKMB, CKMBINDEX, TROPONINI in the last 168 hours. BNP (last 3 results) No results for input(s): PROBNP in the last 8760 hours. HbA1C: No results for input(s): HGBA1C in the last 72 hours. CBG: Recent Labs  Lab 02/03/18 1225 02/03/18 1732 02/03/18 2134 02/04/18 0631 02/04/18 0808  GLUCAP 72 130* 103* 90 94   Lipid Profile: No results for input(s): CHOL, HDL, LDLCALC, TRIG, CHOLHDL, LDLDIRECT in the last 72 hours. Thyroid Function Tests: No results for input(s): TSH, T4TOTAL, FREET4, T3FREE, THYROIDAB in the last 72 hours. Anemia Panel: No results for input(s): VITAMINB12, FOLATE, FERRITIN, TIBC, IRON, RETICCTPCT in the last 72 hours. Sepsis Labs: Recent Labs  Lab 01/30/18 1646 01/30/18 1828 01/31/18 1426 01/31/18 1735  LATICACIDVEN 3.44* 4.60* 2.6* 2.4*    Recent Results (from the past 240 hour(s))  Blood Culture (routine x 2)     Status: None (Preliminary result)   Collection Time: 01/30/18  4:32 PM  Result Value Ref Range Status   Specimen Description BLOOD RIGHT WRIST  Final   Special Requests   Final    BOTTLES DRAWN AEROBIC AND ANAEROBIC Blood Culture adequate volume Performed at [facility], [address] [place], [place] [address]    Culture   Final    NO GROWTH 4 DAYS Performed at [facility], [place] [address] [place], [place] [address]    Report Status PENDING  Incomplete  Blood Culture (routine x 2)     Status: None (Preliminary result)   Collection Time: 01/30/18  4:36 PM  Result Value Ref Range Status   Specimen Description BLOOD RIGHT HAND  Final   Special Requests   Final    BOTTLES DRAWN AEROBIC AND ANAEROBIC Blood Culture adequate volume Performed at [facility], [address] [place], [place] [address]    Culture   Final    NO GROWTH 4 DAYS Performed at [facility], [place] [address], [place], [place] [address]    Report Status PENDING  Incomplete  MRSA PCR Screening     Status: None   Collection Time: 01/30/18 10:16 PM  Result Value Ref Range Status   MRSA by PCR NEGATIVE NEGATIVE Final    Comment:        The GeneXpert MRSA Assay (FDA approved for NASAL specimens only), is one component of a comprehensive MRSA colonization surveillance program. It is not intended to diagnose MRSA infection nor to guide or monitor treatment for MRSA infections. Performed at [facility], [place] [address] [place],  [address]   Gastrointestinal Panel by PCR , Stool     Status: None   Collection Time: 01/31/18  9:35 AM  Result Value Ref Range Status   Campylobacter species NOT DETECTED NOT DETECTED Final   Plesimonas shigelloides NOT DETECTED NOT DETECTED Final   Salmonella species NOT DETECTED NOT DETECTED Final   Yersinia enterocolitica NOT DETECTED NOT DETECTED Final   Vibrio species NOT DETECTED NOT DETECTED Final   Vibrio cholerae NOT DETECTED NOT DETECTED Final   Enteroaggregative E coli (EAEC) NOT DETECTED NOT DETECTED Final   Enteropathogenic E coli (EPEC) NOT DETECTED NOT DETECTED Final   Enterotoxigenic E coli (ETEC) NOT DETECTED NOT DETECTED Final   Shiga like toxin producing E coli (STEC) NOT DETECTED NOT DETECTED Final   Shigella/Enteroinvasive  E coli (EIEC) NOT DETECTED NOT DETECTED Final   Cryptosporidium NOT DETECTED NOT DETECTED Final   Cyclospora cayetanensis NOT DETECTED NOT DETECTED Final   Entamoeba histolytica NOT DETECTED NOT DETECTED Final   Giardia lamblia NOT DETECTED NOT DETECTED Final   Adenovirus F40/41 NOT DETECTED NOT DETECTED Final   Astrovirus NOT DETECTED NOT DETECTED Final   Norovirus GI/GII NOT DETECTED NOT DETECTED Final   Rotavirus A NOT DETECTED NOT DETECTED Final   Sapovirus (I, II, IV, and V)  NOT DETECTED NOT DETECTED Final    Comment: Performed at [facility], [place] [place], [place] [address]  C difficile quick scan w PCR reflex     Status: Abnormal   Collection Time: 01/31/18  9:36 AM  Result Value Ref Range Status   C Diff antigen POSITIVE (A) NEGATIVE Final   C Diff toxin NEGATIVE NEGATIVE Final   C Diff interpretation Results are indeterminate. See PCR results.  Final    Comment: Performed at [facility], [place] [address] [place], [place] [address]  C. Diff by PCR, Reflexed     Status: Abnormal   Collection Time: 01/31/18  9:36 AM  Result Value Ref Range Status   Toxigenic C. Difficile by PCR POSITIVE (A) NEGATIVE Final    Comment: Positive for toxigenic C. difficile with little to no toxin production. Only treat if clinical presentation suggests symptomatic illness. Performed at [facility], [place] [address], [place], [place] [address]          Radiology Studies: US Renal  Result Date: 02/02/2018 CLINICAL DATA:  Acute kidney injury. EXAM: RENAL / URINARY TRACT ULTRASOUND COMPLETE COMPARISON:  CT scan of January 30, 2018. FINDINGS: Right Kidney: Length: 10.4 cm. 7 mm cyst is seen in upper pole. Echogenicity within normal limits. No mass or hydronephrosis visualized. Left Kidney: Length: 10.9 cm. 1 cm cyst is seen in midpole. Echogenicity within normal limits. No mass or hydronephrosis visualized. Bladder: Nondistended secondary to Foley catheter. IMPRESSION: Bilateral simple renal cysts.  No other renal abnormality is noted. Electronically Signed   By: Marijo Conception, M.D.   On: 02/02/2018 13:44        Scheduled Meds: . cholecalciferol  1,000 Units Oral Daily  . cycloSPORINE  1 drop Both Eyes BID  . dorzolamide-timolol  1 drop Both Eyes BID  . ezetimibe  10 mg Oral Daily  . gabapentin  100 mg Oral QHS  . heparin  5,000 Units Subcutaneous Q8H  . insulin aspart  0-9 Units Subcutaneous TID WC  . latanoprost  1 drop Both Eyes QHS   . levothyroxine  25 mcg Oral QAC breakfast  . LORazepam  0.5 mg Oral BID  . omega-3 acid ethyl esters  1 g Oral BID  . patiromer  16.8 g Oral Daily  . sodium chloride flush  3 mL Intravenous Q12H  . vancomycin  125 mg Oral Q6H  . vitamin C  1,000 mg Oral Daily  . vitamin E  400 Units Oral Daily   Continuous Infusions: . sodium chloride 100 mL/hr at 02/04/18 0257     LOS: 5 days     Cordelia Poche, MD [facility] 02/04/2018, 9:20 AM Pager: [phone]  If 7PM-7AM, please contact night-coverage [email or web address] Password [email or web address] 02/04/2018, 9:20 AM

## 2018-02-04 NOTE — Progress Notes (Signed)
Daily Progress Note   Patient Name: Melissa Mccullough       Date: 02/04/18 DOB: 1928-03-05  Age: 84 y.o. MRN#: [id number] Attending Physician: Mariel Aloe, MD Primary Care Physician: System, Pcp Not In Admit Date: 01/30/2018  Reason for Consultation/Follow-up: Establishing goals of care  Subjective: Patient awake, alert, oriented to person and place. Periods of pleasant confusion. Denies pain.   GOC:  Daughter, [name] at bedside. Introduced palliative medicine. Discussed hospital diagnoses, medications, and interventions. Recalled my conversation with her brother [name] on Tuesday (he is HCPOA). She confirms her mother's wishes against heroic means if she was not showing clinical improvement. She states "we just want her comfortable." The family understands she cannot return to independent living from hospitalization. We discussed short term rehab being a stepping stone to determine next steps. Also explained 'watchful waiting' period while we continue monitor kidney functions. Briefly discussed hospice as an option if she clinically declined. Daughter confirms understanding and again the wish for main focus on comfort for her mother.   Answered questions and concerns. Emotional support provided.   Length of Stay: 6  Current Medications: Scheduled Meds:  . cholecalciferol  1,000 Units Oral Daily  . cycloSPORINE  1 drop Both Eyes BID  . dorzolamide-timolol  1 drop Both Eyes BID  . ezetimibe  10 mg Oral Daily  . gabapentin  100 mg Oral QHS  . heparin  5,000 Units Subcutaneous Q8H  . insulin aspart  0-9 Units Subcutaneous TID WC  . latanoprost  1 drop Both Eyes QHS  . levothyroxine  25 mcg Oral QAC breakfast  . LORazepam  0.5 mg Oral BID  . omega-3 acid ethyl esters  1 g Oral BID  .  patiromer  16.8 g Oral Daily  . sodium chloride flush  3 mL Intravenous Q12H  . vancomycin  125 mg Oral Q6H  . vitamin C  1,000 mg Oral Daily  . vitamin E  400 Units Oral Daily    Continuous Infusions: . sodium chloride 100 mL/hr at 02/04/18 2209    PRN Meds: acetaminophen **OR** acetaminophen, HYDROcodone-acetaminophen, ondansetron **OR** ondansetron (ZOFRAN) IV  Physical Exam  Constitutional: She is cooperative. She appears ill.  HENT:  Head: Normocephalic and atraumatic.  Cardiovascular: Regular rhythm.  Pulmonary/Chest: Effort normal.  Neurological: She is alert.  Oriented  to person and place  Skin: Skin is warm and dry.  Nursing note and vitals reviewed.          Vital Signs: BP (!) 141/50 (BP Location: Right Arm)   Pulse 71   Temp (!) 97.5 F (36.4 C) (Oral)   Resp 17   Ht 5' (1.524 m)   Wt 75.2 kg (165 lb 12.6 oz)   SpO2 93%   BMI 32.38 kg/m  SpO2: SpO2: 93 % O2 Device: O2 Device: Not Delivered O2 Flow Rate:    Intake/output summary:   Intake/Output Summary (Last 24 hours) at 02/05/2018 0756 Last data filed at 02/05/2018 0710 Gross per 24 hour  Intake 2520 ml  Output 6125 ml  Net -3605 ml   LBM: Last BM Date: 02/04/18 Baseline Weight: Weight: 54.4 kg (120 lb) Most recent weight: Weight: 75.2 kg (165 lb 12.6 oz)       Palliative Assessment/Data: PPS 40%   Flowsheet Rows     Most Recent Value  Intake Tab  Referral Department  Hospitalist  Unit at Time of Referral  Med/Surg Unit  Palliative Care Primary Diagnosis  Sepsis/Infectious Disease  Palliative Care Type  New Palliative care  Reason for referral  Clarify Goals of Care  Date first seen by Palliative Care  02/02/18  Clinical Assessment  Palliative Performance Scale Score  40%  Psychosocial & Spiritual Assessment  Palliative Care Outcomes  Patient/Family meeting held?  Yes  Who was at the meeting?  patient and son   Palliative Care Outcomes  Clarified goals of care, Counseled regarding  hospice, Provided end of life care assistance, Provided advance care planning, Provided psychosocial or spiritual support, ACP counseling assistance      Patient Active Problem List   Diagnosis Date Noted  . Pressure injury of skin 02/02/2018  . Hyperkalemia   . Clostridium difficile infection   . Palliative care by specialist   . Goals of care, counseling/discussion   . Renal failure 01/30/2018  . Sepsis ([place]) 01/30/2018  . Diarrhea 01/30/2018  . Anxiety 08/03/2014  . SOB (shortness of breath) 07/17/2014  . Encephalopathy acute 07/17/2014  . Hypoxia 07/17/2014  . Hyponatremia 07/17/2014  . Acute renal insufficiency 07/17/2014  . Confusion 07/17/2014  . Vitamin D Deficiency 05/17/2014  . UTI (lower urinary tract infection) 05/10/2014  . T2_NIDDM w/ Stage 3 CKD (GFR 68 ml/min)   . Hyperlipidemia   . Neuropathy   . Hypothyroidism   . Anemia   . Glaucoma   . Community acquired pneumonia 05/19/2012  . Essential hypertension 05/19/2012    Palliative Care Assessment & Plan   Patient Profile: 84 y.o. female  with past medical history of diabetes mellitus type 2, neuropathy, hypothyroidism, hypertension, hyperlipidemia, glaucoma, and anemia admitted on 01/30/2018 with diarrhea x1 week. In ED, WBC's 14.5, lactic acid 4.6, K+ 7.2 and BUN/Cr 95/4.3. Nephrology consulted and initiated sodium bicarb infusion. CT negative for acute findings. Started on IV antibiotics for concern of infectious colitis. Positive for cdiff and receiving oral vancomycin. Worsening renal functions on 2/19. Good urine output. Palliative medicine consultation for goals of care.   Assessment: Sepsis C. Difficile colitis Acute kidney injury Hyperkalemia DM type 2 Cirrhosis  Recommendations/Plan:  DNR/DNI  Continue medical management. Patient/family very clear on wishes against heroic measures if she does not show improvement or clinically declined. Comfort is their main goal.   PMT will continue to  follow. Plan to meet with all three children tomorrow 9am at bedside.   Goals  of Care and Additional Recommendations:  DNR/DNI. Continue medical management.   Code Status: DNR   Code Status Orders  (From admission, onward)        Start     Ordered   01/30/18 2220  Do not attempt resuscitation (DNR)  Continuous    Question Answer Comment  In the event of cardiac or respiratory ARREST Do not call a "code blue"   In the event of cardiac or respiratory ARREST Do not perform Intubation, CPR, defibrillation or ACLS   In the event of cardiac or respiratory ARREST Use medication by any route, position, wound care, and other measures to relive pain and suffering. May use oxygen, suction and manual treatment of airway obstruction as needed for comfort.      01/30/18 2223    Code Status History    Date Active Date Inactive Code Status Order ID Comments User Context   07/18/2014 01:31 07/21/2014 18:52 Full Code [id number]  Phillips Grout, MD Inpatient   05/11/2014 00:14 05/13/2014 13:11 Full Code [id number]  Phillips Grout, MD Inpatient   05/19/2012 17:41 05/22/2012 20:56 DNR [id number]  Gracy Bruins, RN Inpatient    Advance Directive Documentation     Most Recent Value  Type of Advance Directive  Healthcare Power of Attorney  Pre-existing out of facility DNR order (yellow form or pink MOST form)  No data  "MOST" Form in Place?  No data       Prognosis:   Unable to determine  Discharge Planning:  To Be Determined likely will need SNF for rehab  Care plan was discussed with patient, daughter, Dr Lonny Prude  Thank you for allowing the Palliative Medicine Team to assist in the care of this patient.   Time In: 1630 Time Out: 1650 Total Time 55min Prolonged Time Billed  no       Greater than 50%  of this time was spent counseling and coordinating care related to the above assessment and plan.  Ihor Dow, FNP-C Palliative Medicine Team  Phone: [phone] Fax: [phone]  Please  contact Palliative Medicine Team phone at [phone] for questions and concerns.

## 2018-02-04 NOTE — [place] FL2 (Signed)
[place] MEDICAID FL2 LEVEL OF CARE SCREENING TOOL     IDENTIFICATION  Patient Name: Melissa Mccullough Birthdate: Jan 24, 1928 Sex: female Admission Date (Current Location): 01/30/2018  [facility] and [place] Number:  Herbalist and Address:  [facility] [place], [place] [address] [place], [place] [address]      Provider Number: [id number]  Attending Physician Name and Address:  Mariel Aloe, MD  Relative Name and Phone Number:       Current Level of Care: Hospital Recommended Level of Care: [place] Prior Approval Number:    Date Approved/Denied:   PASRR Number: [id number]  Discharge Plan: SNF    Current Diagnoses: Patient Active Problem List   Diagnosis Date Noted  . Pressure injury of skin 02/02/2018  . Hyperkalemia   . Clostridium difficile infection   . Palliative care by specialist   . Goals of care, counseling/discussion   . Renal failure 01/30/2018  . Sepsis ([place]) 01/30/2018  . Diarrhea 01/30/2018  . Anxiety 08/03/2014  . SOB (shortness of breath) 07/17/2014  . Encephalopathy acute 07/17/2014  . Hypoxia 07/17/2014  . Hyponatremia 07/17/2014  . Acute renal insufficiency 07/17/2014  . Confusion 07/17/2014  . Vitamin D Deficiency 05/17/2014  . UTI (lower urinary tract infection) 05/10/2014  . T2_NIDDM w/ Stage 3 CKD (GFR 68 ml/min)   . Hyperlipidemia   . Neuropathy   . Hypothyroidism   . Anemia   . Glaucoma   . Community acquired pneumonia 05/19/2012  . Essential hypertension 05/19/2012    Orientation RESPIRATION BLADDER Height & Weight     Self, Place, Situation  Normal Incontinent, Indwelling catheter Weight: 165 lb 12.6 oz (75.2 kg) Height:  5' (152.4 cm)  BEHAVIORAL SYMPTOMS/MOOD NEUROLOGICAL BOWEL NUTRITION STATUS      Continent Diet(carb modified)  AMBULATORY STATUS COMMUNICATION OF NEEDS Skin   Extensive Assist Verbally PU Stage and Appropriate Care                       Personal Care  Assistance Level of Assistance  Bathing, Dressing Bathing Assistance: Maximum assistance   Dressing Assistance: Maximum assistance     Functional Limitations Info             SPECIAL CARE FACTORS FREQUENCY  PT (By licensed PT), OT (By licensed OT)     PT Frequency: 5/wk OT Frequency: 5/wk            Contractures      Additional Factors Info  Code Status, Allergies, Psychotropic, Isolation Precautions Code Status Info: DNR Allergies Info: Codeine, Macrobid Nitrofurantoin Macrocrystal Psychotropic Info: ativan   Isolation Precautions Info: enteric     Current Medications (02/04/2018):  This is the current hospital active medication list Current Facility-Administered Medications  Medication Dose Route Frequency Provider Last Rate Last Dose  . 0.9 %  sodium chloride infusion   Intravenous Continuous Mariel Aloe, MD 100 mL/hr at 02/04/18 0257    . acetaminophen (TYLENOL) tablet 650 mg  650 mg Oral Q6H PRN Purohit, Shrey C, MD       Or  . acetaminophen (TYLENOL) suppository 650 mg  650 mg Rectal Q6H PRN Purohit, Shrey C, MD      . cholecalciferol (VITAMIN D) tablet 1,000 Units  1,000 Units Oral Daily Purohit, Konrad Dolores, MD   1,000 Units at 02/04/18 0909  . cycloSPORINE (RESTASIS) 0.05 % ophthalmic emulsion 1 drop  1 drop Both Eyes BID Purohit, Konrad Dolores, MD  1 drop at 02/04/18 0910  . dorzolamide-timolol (COSOPT) 22.3-6.8 MG/ML ophthalmic solution 1 drop  1 drop Both Eyes BID Purohit, Shrey C, MD   1 drop at 02/04/18 0910  . ezetimibe (ZETIA) tablet 10 mg  10 mg Oral Daily Purohit, Shrey C, MD   10 mg at 02/04/18 0909  . gabapentin (NEURONTIN) capsule 100 mg  100 mg Oral QHS Dessa Phi, DO   100 mg at 02/03/18 2241  . heparin injection 5,000 Units  5,000 Units Subcutaneous Q8H Purohit, Konrad Dolores, MD   5,000 Units at 02/04/18 1445  . HYDROcodone-acetaminophen (NORCO/VICODIN) 5-325 MG per tablet 1-2 tablet  1-2 tablet Oral Q4H PRN Purohit, Konrad Dolores, MD   1 tablet at 02/03/18  2241  . insulin aspart (novoLOG) injection 0-9 Units  0-9 Units Subcutaneous TID WC Purohit, Konrad Dolores, MD   1 Units at 02/03/18 1750  . latanoprost (XALATAN) 0.005 % ophthalmic solution 1 drop  1 drop Both Eyes QHS Purohit, Shrey C, MD   1 drop at 02/03/18 2241  . levothyroxine (SYNTHROID, LEVOTHROID) tablet 25 mcg  25 mcg Oral QAC breakfast Purohit, Konrad Dolores, MD   25 mcg at 02/04/18 0909  . LORazepam (ATIVAN) tablet 0.5 mg  0.5 mg Oral BID Purohit, Shrey C, MD   0.5 mg at 02/04/18 0909  . omega-3 acid ethyl esters (LOVAZA) capsule 1 g  1 g Oral BID Purohit, Shrey C, MD   1 g at 02/04/18 0909  . ondansetron (ZOFRAN) tablet 4 mg  4 mg Oral Q6H PRN Purohit, Shrey C, MD       Or  . ondansetron (ZOFRAN) injection 4 mg  4 mg Intravenous Q6H PRN Purohit, Konrad Dolores, MD      . patiromer (VELTASSA) packet 16.8 g  16.8 g Oral Daily Donato Heinz, MD   16.8 g at 02/04/18 1320  . sodium chloride flush (NS) 0.9 % injection 3 mL  3 mL Intravenous Q12H Purohit, Shrey C, MD   3 mL at 02/04/18 0913  . vancomycin (VANCOCIN) 50 mg/mL oral solution 125 mg  125 mg Oral Q6H Cardama, Grayce Sessions, MD   125 mg at 02/04/18 1320  . vitamin C (ASCORBIC ACID) tablet 1,000 mg  1,000 mg Oral Daily Purohit, Shrey C, MD   1,000 mg at 02/04/18 0909  . vitamin E capsule 400 Units  400 Units Oral Daily Purohit, Konrad Dolores, MD   400 Units at 02/04/18 0909     Discharge Medications: Please see discharge summary for a list of discharge medications.  Relevant Imaging Results:  Relevant Lab Results:   Additional Information SS#: [id number]  Jorge Ny, LCSW

## 2018-02-05 LAB — BLOOD CULTURE ID PANEL (REFLEXED)
ACINETOBACTER BAUMANNII: NOT DETECTED
CANDIDA ALBICANS: NOT DETECTED
CANDIDA KRUSEI: NOT DETECTED
CANDIDA TROPICALIS: NOT DETECTED
Candida glabrata: NOT DETECTED
Candida parapsilosis: NOT DETECTED
ESCHERICHIA COLI: NOT DETECTED
Enterobacter cloacae complex: NOT DETECTED
Enterobacteriaceae species: NOT DETECTED
Enterococcus species: NOT DETECTED
HAEMOPHILUS INFLUENZAE: NOT DETECTED
KLEBSIELLA PNEUMONIAE: NOT DETECTED
Klebsiella oxytoca: NOT DETECTED
Listeria monocytogenes: NOT DETECTED
Neisseria meningitidis: NOT DETECTED
Proteus species: NOT DETECTED
Pseudomonas aeruginosa: NOT DETECTED
SERRATIA MARCESCENS: NOT DETECTED
STAPHYLOCOCCUS AUREUS BCID: NOT DETECTED
STREPTOCOCCUS PYOGENES: NOT DETECTED
STREPTOCOCCUS SPECIES: NOT DETECTED
Staphylococcus species: NOT DETECTED
Streptococcus agalactiae: NOT DETECTED
Streptococcus pneumoniae: NOT DETECTED

## 2018-02-05 LAB — RENAL FUNCTION PANEL
Albumin: 2.5 g/dL — ABNORMAL LOW (ref 3.5–5.0)
Anion gap: 14 (ref 5–15)
BUN: 73 mg/dL — AB (ref 6–20)
CHLORIDE: 99 mmol/L — AB (ref 101–111)
CO2: 21 mmol/L — AB (ref 22–32)
CREATININE: 6.03 mg/dL — AB (ref 0.44–1.00)
Calcium: 8.4 mg/dL — ABNORMAL LOW (ref 8.9–10.3)
GFR calc Af Amer: 6 mL/min — ABNORMAL LOW (ref >60)
GFR calc non Af Amer: 6 mL/min — ABNORMAL LOW (ref >60)
Glucose, Bld: 113 mg/dL — ABNORMAL HIGH (ref 65–99)
POTASSIUM: 4.6 mmol/L (ref 3.5–5.1)
Phosphorus: 6 mg/dL — ABNORMAL HIGH (ref 2.5–4.6)
Sodium: 134 mmol/L — ABNORMAL LOW (ref 135–145)

## 2018-02-05 LAB — URINE CULTURE

## 2018-02-05 LAB — GLUCOSE, CAPILLARY
GLUCOSE-CAPILLARY: 96 mg/dL (ref 65–99)
GLUCOSE-CAPILLARY: 205 mg/dL — AB (ref 65–99)
GLUCOSE-CAPILLARY: 171 mg/dL — AB (ref 65–99)
Glucose-Capillary: 95 mg/dL (ref 65–99)

## 2018-02-05 NOTE — Progress Notes (Signed)
CSW spoke with [name] and [name]. They are requesting Melissa Mccullough [facility] as their first choice for patient. Will check back on Monday.  Percell Locus Bruce Churilla LCSW [phone]

## 2018-02-05 NOTE — Progress Notes (Signed)
PROGRESS NOTE    TAHJ LINDSETH  WUJ:[id number] DOB: Feb 25, 1928 DOA: 01/30/2018 PCP: System, Pcp Not In   Brief Narrative: Melissa Mccullough is a 84 y.o. femalewith medical history significant oftype 2 diabetes on oral medication, hypertension, glaucoma, hyperlipidemia, hypothyroidism who comes in with diarrhea for approximately 1 week. Per report of the patient and her family she began to have nausea, vomiting, and diarrhea approximately 8 days ago. She was seen in the ER where she was noted to be dehydrated and treated symptomatically. Per the family, her nausea and vomiting resolved. Her diarrhea continued however. She began to have multiple bowel movements a day. There was never any blood frank blood in the bowel movements. On discussion with the patient, she reports that she had crampy abdominal pain that was intermittent but wax and wane throughout the day. She began to have innumerable bowel movements and family brought her back to the ED at [facility]. In the ED, she was noted to be in renal failure with Cr 4.3 and potassium 7. She was given IV zosyn and oral vanco for concern of infectious colitis. Nephrology was consulted and patient started on IV sodium bicarb. She tested positive for C Diff. Urine output has significantly improved with continued worsening of creatinine.   Assessment & Plan:   Principal Problem:   Acute renal insufficiency Active Problems:   Essential hypertension   Hyperlipidemia   Hypothyroidism   Renal failure   Sepsis ([place])   Diarrhea   Pressure injury of skin   Hyperkalemia   Clostridium difficile infection   Palliative care by specialist   Goals of care, counseling/discussion   C. Difficile colitis Appears to be improved. -Continue Vancomycin PO  Acute kidney injury Likely secondary to hypovolemia in setting of significant diarrhea in addition to diuretic/ACEi use. Creatinine worsened but has had excellent urine  output. -Nephrology recommendations: IV fluids discontinued -BMP in AM  Hyperkalemia Secondary to kidney injury -Nephology recommendations: [place]  Confusion Unknown etiology. Afebrile. Normal WBC. Patient with underlying progressive memory loss. Improved today. Urinalysis does not suggest infection.  Diabetes mellitus, type 2 Last Hemoglobin A1C of 7.4% in 2015. Currently well controlled. -Holding glipizide, metformin and hydrochlorothiazide -Continue SSI  Essential hypertension -Continue to hold amlodipine, lisinopril, hydrochlorothiazide  Hypothyroidism -Continue Synthroid  Hyperlipidemia -Continue Zetia  Cirrhosis Seen on CT abdomen. Outpatient follow-up depending on goals of care   DVT prophylaxis: Heparin Code Status:   Code Status: DNR Family Communication: Son, and two daughters at bedside Disposition Plan: Pending renal improvement vs goals of care   Consultants:   Nephrology  Palliative care medicine  Procedures:   None  Antimicrobials:  Vancomycin    Subjective: No issues overnight. Foot pain.  Objective: Vitals:   02/04/18 2208 02/05/18 0645 02/05/18 1153 02/05/18 1423  BP: (!) 145/62 (!) 141/50  (!) 140/55  Pulse: 80 71 79 94  Resp: 17 17  16   Temp: 98.9 F (37.2 C) (!) 97.5 F (36.4 C)  97.6 F (36.4 C)  TempSrc: Oral Oral  Oral  SpO2: 95% 93%  95%  Weight:      Height:        Intake/Output Summary (Last 24 hours) at 02/05/2018 1440 Last data filed at 02/05/2018 1425 Gross per 24 hour  Intake 2640 ml  Output 4750 ml  Net -2110 ml   Filed Weights   01/30/18 1222 01/31/18 0620  Weight: 54.4 kg (120 lb) 75.2 kg (165 lb 12.6 oz)  Examination:  General exam: Appears calm and comfortable Respiratory system: Clear to auscultation. Respiratory effort normal. Cardiovascular system: S1 & S2 heard, RRR. No murmurs. Gastrointestinal system: Abdomen is nondistended, soft and nontender. No organomegaly or masses felt. Normal  bowel sounds heard. Central nervous system: Alert and oriented to person. Extremities: No pitting edema. No calf tenderness Skin: No cyanosis. No rashes Psychiatry: Judgement and insight appear impaired.     Data Reviewed: I have personally reviewed following labs and imaging studies  CBC: Recent Labs  Lab 01/30/18 1426 01/31/18 0241 02/01/18 0205 02/02/18 0220 02/03/18 0425  WBC 14.5* 12.5* 10.0 10.8* 9.4  NEUTROABS 10.3*  --   --   --   --   HGB 11.9* 10.3* 10.0* 11.3* 9.5*  HCT 36.4 31.3* 30.0* 34.0* 29.7*  MCV 98.6 96.9 94.9 96.0 96.4  PLT 246 225 207 204 371   Basic Metabolic Panel: Recent Labs  Lab 02/02/18 0220  02/02/18 1531  02/03/18 0425  02/03/18 2242 02/04/18 0321 02/04/18 0645 02/04/18 1619 02/05/18 0430  NA 129*  --  129*  --  131*  --   --   --  132*  --  134*  K 6.1*   < > 5.5*  5.4*   < > 4.9  5.4*   < > 5.6* 5.2* 5.3* 4.8 4.6  CL 86*  --  88*  --  95*  --   --   --  95*  --  99*  CO2 27  --  26  --  23  --   --   --  21*  --  21*  GLUCOSE 162*  --  100*  --  95  --   --   --  85  --  113*  BUN 81*  --  78*  --  69*  --   --   --  75*  --  73*  CREATININE 5.72*  --  6.07*  --  5.50*  --   --   --  6.46*  --  6.03*  CALCIUM 8.1*  --  7.9*  --  6.9*  --   --   --  8.0*  --  8.4*  PHOS 4.6  --  4.9*  --  4.9*  --   --   --  6.0*  --  6.0*   < > = values in this interval not displayed.   GFR: Estimated Creatinine Clearance: 6 mL/min (A) (by C-G formula based on SCr of 6.03 mg/dL (H)). Liver Function Tests: Recent Labs  Lab 01/30/18 1426 01/31/18 0241 02/01/18 0205 02/02/18 0220 02/02/18 1531 02/03/18 0425 02/04/18 0645 02/05/18 0430  AST 29 31 29   --   --   --   --   --   ALT 26 19 18   --   --   --   --   --   ALKPHOS 64 48 46  --   --   --   --   --   BILITOT 0.6 0.7 0.7  --   --   --   --   --   PROT 7.2 5.6* 4.8*  --   --   --   --   --   ALBUMIN 3.1* 2.2* 1.9*  1.9* 2.3* 2.1* 2.0* 2.4* 2.5*   Recent Labs  Lab 01/30/18 1426   LIPASE 48   No results for input(s): AMMONIA in the last 168 hours. Coagulation Profile: No results for input(s): INR,  PROTIME in the last 168 hours. Cardiac Enzymes: No results for input(s): CKTOTAL, CKMB, CKMBINDEX, TROPONINI in the last 168 hours. BNP (last 3 results) No results for input(s): PROBNP in the last 8760 hours. HbA1C: No results for input(s): HGBA1C in the last 72 hours. CBG: Recent Labs  Lab 02/04/18 1313 02/04/18 1655 02/04/18 2156 02/05/18 0751 02/05/18 1154  GLUCAP 100* 112* 143* 95 96   Lipid Profile: No results for input(s): CHOL, HDL, LDLCALC, TRIG, CHOLHDL, LDLDIRECT in the last 72 hours. Thyroid Function Tests: No results for input(s): TSH, T4TOTAL, FREET4, T3FREE, THYROIDAB in the last 72 hours. Anemia Panel: No results for input(s): VITAMINB12, FOLATE, FERRITIN, TIBC, IRON, RETICCTPCT in the last 72 hours. Sepsis Labs: Recent Labs  Lab 01/30/18 1646 01/30/18 1828 01/31/18 1426 01/31/18 1735  LATICACIDVEN 3.44* 4.60* 2.6* 2.4*    Recent Results (from the past 240 hour(s))  Blood Culture (routine x 2)     Status: None (Preliminary result)   Collection Time: 01/30/18  4:32 PM  Result Value Ref Range Status   Specimen Description BLOOD RIGHT WRIST  Final   Special Requests   Final    BOTTLES DRAWN AEROBIC AND ANAEROBIC Blood Culture adequate volume Performed at [facility], [address] [place], [place] [address]    Culture  Setup Time   Final    GRAM POSITIVE RODS ANAEROBIC BOTTLE ONLY CRITICAL RESULT CALLED TO, READ BACK BY AND VERIFIED WITH: E MARTIN,PHARMD AT 0807 02/05/18 BY L BENFIELD    Culture   Final    GRAM POSITIVE RODS CULTURE REINCUBATED FOR BETTER GROWTH Performed at [facility], [place] [address], [place], [place] [address]    Report Status PENDING  Incomplete  Blood Culture ID Panel (Reflexed)     Status: None   Collection Time: 01/30/18  4:32 PM  Result Value Ref Range Status   Enterococcus species  NOT DETECTED NOT DETECTED Final   Listeria monocytogenes NOT DETECTED NOT DETECTED Final   Staphylococcus species NOT DETECTED NOT DETECTED Final   Staphylococcus aureus NOT DETECTED NOT DETECTED Final   Streptococcus species NOT DETECTED NOT DETECTED Final   Streptococcus agalactiae NOT DETECTED NOT DETECTED Final   Streptococcus pneumoniae NOT DETECTED NOT DETECTED Final   Streptococcus pyogenes NOT DETECTED NOT DETECTED Final   Acinetobacter baumannii NOT DETECTED NOT DETECTED Final   Enterobacteriaceae species NOT DETECTED NOT DETECTED Final   Enterobacter cloacae complex NOT DETECTED NOT DETECTED Final   Escherichia coli NOT DETECTED NOT DETECTED Final   Klebsiella oxytoca NOT DETECTED NOT DETECTED Final   Klebsiella pneumoniae NOT DETECTED NOT DETECTED Final   Proteus species NOT DETECTED NOT DETECTED Final   Serratia marcescens NOT DETECTED NOT DETECTED Final   Haemophilus influenzae NOT DETECTED NOT DETECTED Final   Neisseria meningitidis NOT DETECTED NOT DETECTED Final   Pseudomonas aeruginosa NOT DETECTED NOT DETECTED Final   Candida albicans NOT DETECTED NOT DETECTED Final   Candida glabrata NOT DETECTED NOT DETECTED Final   Candida krusei NOT DETECTED NOT DETECTED Final   Candida parapsilosis NOT DETECTED NOT DETECTED Final   Candida tropicalis NOT DETECTED NOT DETECTED Final    Comment: Performed at [facility], [place] [address], [place], [place] [address]  Blood Culture (routine x 2)     Status: None   Collection Time: 01/30/18  4:36 PM  Result Value Ref Range Status   Specimen Description BLOOD RIGHT HAND  Final   Special Requests   Final  BOTTLES DRAWN AEROBIC AND ANAEROBIC Blood Culture adequate volume Performed at [facility], [address] [place], [place] [address]    Culture   Final    NO GROWTH 5 DAYS Performed at [facility], [place] [address] [place], [place] [address]    Report Status 02/04/2018 FINAL  Final  MRSA PCR Screening      Status: None   Collection Time: 01/30/18 10:16 PM  Result Value Ref Range Status   MRSA by PCR NEGATIVE NEGATIVE Final    Comment:        The GeneXpert MRSA Assay (FDA approved for NASAL specimens only), is one component of a comprehensive MRSA colonization surveillance program. It is not intended to diagnose MRSA infection nor to guide or monitor treatment for MRSA infections. Performed at [facility], [place] [address], [place], [place] [address]   Gastrointestinal Panel by PCR , Stool     Status: None   Collection Time: 01/31/18  9:35 AM  Result Value Ref Range Status   Campylobacter species NOT DETECTED NOT DETECTED Final   Plesimonas shigelloides NOT DETECTED NOT DETECTED Final   Salmonella species NOT DETECTED NOT DETECTED Final   Yersinia enterocolitica NOT DETECTED NOT DETECTED Final   Vibrio species NOT DETECTED NOT DETECTED Final   Vibrio cholerae NOT DETECTED NOT DETECTED Final   Enteroaggregative E coli (EAEC) NOT DETECTED NOT DETECTED Final   Enteropathogenic E coli (EPEC) NOT DETECTED NOT DETECTED Final   Enterotoxigenic E coli (ETEC) NOT DETECTED NOT DETECTED Final   Shiga like toxin producing E coli (STEC) NOT DETECTED NOT DETECTED Final   Shigella/Enteroinvasive E coli (EIEC) NOT DETECTED NOT DETECTED Final   Cryptosporidium NOT DETECTED NOT DETECTED Final   Cyclospora cayetanensis NOT DETECTED NOT DETECTED Final   Entamoeba histolytica NOT DETECTED NOT DETECTED Final   Giardia lamblia NOT DETECTED NOT DETECTED Final   Adenovirus F40/41 NOT DETECTED NOT DETECTED Final   Astrovirus NOT DETECTED NOT DETECTED Final   Norovirus GI/GII NOT DETECTED NOT DETECTED Final   Rotavirus A NOT DETECTED NOT DETECTED Final   Sapovirus (I, II, IV, and V) NOT DETECTED NOT DETECTED Final    Comment: Performed at [facility], [address] [place], [place] [address]  C difficile quick scan w PCR reflex     Status: Abnormal   Collection Time: 01/31/18  9:36 AM   Result Value Ref Range Status   C Diff antigen POSITIVE (A) NEGATIVE Final   C Diff toxin NEGATIVE NEGATIVE Final   C Diff interpretation Results are indeterminate. See PCR results.  Final    Comment: Performed at [facility], [place] [address] [place], [place] [address]  C. Diff by PCR, Reflexed     Status: Abnormal   Collection Time: 01/31/18  9:36 AM  Result Value Ref Range Status   Toxigenic C. Difficile by PCR POSITIVE (A) NEGATIVE Final    Comment: Positive for toxigenic C. difficile with little to no toxin production. Only treat if clinical presentation suggests symptomatic illness. Performed at [facility], [place] [address], [place], [place] [address]   Culture, Urine     Status: Abnormal   Collection Time: 02/03/18  3:28 PM  Result Value Ref Range Status   Specimen Description URINE, CATHETERIZED  Final   Special Requests   Final    NONE Performed at [facility], [place] [address], [place], [place] [address]    Culture  40,000 COLONIES/mL ENTEROCOCCUS FAECALIS (A)  Final   Report Status 02/05/2018 FINAL  Final   Organism ID, Bacteria ENTEROCOCCUS FAECALIS (A)  Final      Susceptibility   Enterococcus faecalis - MIC*    AMPICILLIN <=2 SENSITIVE Sensitive     LEVOFLOXACIN 1 SENSITIVE Sensitive     NITROFURANTOIN <=16 SENSITIVE Sensitive     VANCOMYCIN 1 SENSITIVE Sensitive     * 40,000 COLONIES/mL ENTEROCOCCUS FAECALIS         Radiology Studies: No results found.      Scheduled Meds: . cholecalciferol  1,000 Units Oral Daily  . cycloSPORINE  1 drop Both Eyes BID  . dorzolamide-timolol  1 drop Both Eyes BID  . ezetimibe  10 mg Oral Daily  . gabapentin  100 mg Oral QHS  . heparin  5,000 Units Subcutaneous Q8H  . insulin aspart  0-9 Units Subcutaneous TID WC  . latanoprost  1 drop Both Eyes QHS  . levothyroxine  25 mcg Oral QAC breakfast  . LORazepam  0.5 mg Oral BID  . omega-3 acid ethyl esters  1 g Oral BID  . patiromer  16.8 g Oral Daily   . sodium chloride flush  3 mL Intravenous Q12H  . vancomycin  125 mg Oral Q6H  . vitamin C  1,000 mg Oral Daily  . vitamin E  400 Units Oral Daily   Continuous Infusions:    LOS: 6 days     Cordelia Poche, MD [facility] 02/05/2018, 2:40 PM Pager: [phone]  If 7PM-7AM, please contact night-coverage [email or web address] Password [facility] 02/05/2018, 2:40 PM

## 2018-02-05 NOTE — Progress Notes (Signed)
Daily Progress Note   Patient Name: Melissa Mccullough       Date: 02/04/18 DOB: March 22, 1928  Age: 84 y.o. MRN#: [id number] Attending Physician: Mariel Aloe, MD Primary Care Physician: System, Pcp Not In Admit Date: 01/30/2018  Reason for Consultation/Follow-up: Establishing goals of care  Subjective: Patient resting comfortably during my visit. Denies pain.   GOC:  Adult children at bedside including son Melissa Mccullough), daughters Melissa Mccullough and [place]), and daughter-in-law Melissa Mccullough). Again introduced palliative medicine. Discussed baseline prior to hospitalization--independent and living at ILF. Discussed events leading up to hospitalization as well as diagnoses and interventions. All family members agree that "comfort" is the main goal for their mother. She has been very clear on her wishes against heroic or prolonging measures if she was nearing EOL. They are asking to discontinue IVF to help guide next steps.   We discussed all discharge options. She is not strong enough to return back to ILF at this time. Family is considering SNF for rehab but daughter, Melissa Mccullough states "what is there to rehab if her kidneys are failing." Also discussed hospice options. I explained that she is not yet eligible for inpatient hospice facility, explaining two weeks or less prognosis. Also explained out of pocket bed cost if she went to SNF with hospice. Family agrees that once fluids are discontinued and she remains stable, SNF to attempt rehab would be best. "Get her stronger." They also agree that if she continues to decline, they will transition to comfort approach and hospice services.   After our conversation, Dr. Joelyn Oms arrived at bedside and agrees with next steps to discontinue IVF and continue to monitor kidney  functions outpatient at [facility]. If she declines, clear goal of transition to comfort and hospice.   Therapeutic listening as family shares stories of Melissa Mccullough. Emotional and spiritual support provided.    Length of Stay: 6  Current Medications: Scheduled Meds:  . cholecalciferol  1,000 Units Oral Daily  . cycloSPORINE  1 drop Both Eyes BID  . dorzolamide-timolol  1 drop Both Eyes BID  . ezetimibe  10 mg Oral Daily  . gabapentin  100 mg Oral QHS  . heparin  5,000 Units Subcutaneous Q8H  . insulin aspart  0-9 Units Subcutaneous TID WC  . latanoprost  1 drop Both Eyes QHS  . levothyroxine  25 mcg Oral QAC breakfast  . LORazepam  0.5 mg Oral BID  . omega-3 acid ethyl esters  1 g Oral BID  . patiromer  16.8 g Oral Daily  . sodium chloride flush  3 mL Intravenous Q12H  . vancomycin  125 mg Oral Q6H  . vitamin C  1,000 mg Oral Daily  . vitamin E  400 Units Oral Daily    Continuous Infusions:   PRN Meds: acetaminophen **OR** acetaminophen, HYDROcodone-acetaminophen, ondansetron **OR** ondansetron (ZOFRAN) IV  Physical Exam  Constitutional: She is easily aroused.  HENT:  Head: Normocephalic and atraumatic.  Cardiovascular: Regular rhythm.  Pulmonary/Chest: Effort normal.  Neurological: She is easily aroused.  Oriented to person and place  Skin: Skin is warm and dry. There is pallor.  Nursing note and vitals reviewed.          Vital Signs: BP (!) 141/50 (BP Location: Right Arm)   Pulse 71   Temp (!) 97.5 F (36.4 C) (Oral)   Resp 17   Ht 5' (1.524 m)   Wt 75.2 kg (165 lb 12.6 oz)   SpO2 93%   BMI 32.38 kg/m  SpO2: SpO2: 93 % O2 Device: O2 Device: Not Delivered O2 Flow Rate:    Intake/output summary:   Intake/Output Summary (Last 24 hours) at 02/05/2018 1150 Last data filed at 02/05/2018 1046 Gross per 24 hour  Intake 2520 ml  Output 6425 ml  Net -3905 ml   LBM: Last BM Date: 02/04/18 Baseline Weight: Weight: 54.4 kg (120 lb) Most recent weight: Weight: 75.2 kg  (165 lb 12.6 oz)       Palliative Assessment/Data: PPS 40%   Flowsheet Rows     Most Recent Value  Intake Tab  Referral Department  Hospitalist  Unit at Time of Referral  Med/Surg Unit  Palliative Care Primary Diagnosis  Sepsis/Infectious Disease  Palliative Care Type  New Palliative care  Reason for referral  Clarify Goals of Care  Date first seen by Palliative Care  02/02/18  Clinical Assessment  Palliative Performance Scale Score  40%  Psychosocial & Spiritual Assessment  Palliative Care Outcomes  Patient/Family meeting held?  Yes  Who was at the meeting?  patient, son, two daughters, daughter-in-law  Palliative Care Outcomes  Clarified goals of care, Provided end of life care assistance, Provided psychosocial or spiritual support, ACP counseling assistance, Counseled regarding hospice, Provided advance care planning, Linked to palliative care logitudinal support      Patient Active Problem List   Diagnosis Date Noted  . Pressure injury of skin 02/02/2018  . Hyperkalemia   . Clostridium difficile infection   . Palliative care by specialist   . Goals of care, counseling/discussion   . Renal failure 01/30/2018  . Sepsis ([place]) 01/30/2018  . Diarrhea 01/30/2018  . Anxiety 08/03/2014  . SOB (shortness of breath) 07/17/2014  . Encephalopathy acute 07/17/2014  . Hypoxia 07/17/2014  . Hyponatremia 07/17/2014  . Acute renal insufficiency 07/17/2014  . Confusion 07/17/2014  . Vitamin D Deficiency 05/17/2014  . UTI (lower urinary tract infection) 05/10/2014  . T2_NIDDM w/ Stage 3 CKD (GFR 68 ml/min)   . Hyperlipidemia   . Neuropathy   . Hypothyroidism   . Anemia   . Glaucoma   . Community acquired pneumonia 05/19/2012  . Essential hypertension 05/19/2012    Palliative Care Assessment & Plan   Patient Profile: 84 y.o. female  with past medical history of diabetes mellitus type 2, neuropathy, hypothyroidism, hypertension, hyperlipidemia, glaucoma, and anemia admitted  on 01/30/2018 with diarrhea x1 week. In ED, WBC's 14.5, lactic acid 4.6, K+ 7.2 and BUN/Cr 95/4.3. Nephrology consulted and initiated sodium bicarb infusion. CT negative for acute findings. Started on IV antibiotics for concern of infectious colitis. Positive for cdiff and receiving oral vancomycin. Worsening renal functions on 2/19. Good urine output. Palliative medicine consultation for goals of care.   Assessment: Sepsis C. Difficile colitis Acute kidney injury Hyperkalemia DM type 2 Cirrhosis  Recommendations/Plan:  DNR/DNI. MOST form completed and in chart.  Continue medical management. Discontinue IVF today per nephrology.   Watchful waiting. Plan will be for discharge to SNF for rehab with palliative follow-up. If patient does not progress and kidney functions worsen, family wishes for comfort measures and hospice services.   SW notified for outpatient palliative referral. I also encouraged son to contact PCP for outpatient palliative referral.    Goals of Care and Additional Recommendations:  DNR/DNI. Continue medical management. If she clinically declines, transition to comfort and hospice services.  Code Status: DNR   Code Status Orders  (From admission, onward)        Start     Ordered   01/30/18 2220  Do not attempt resuscitation (DNR)  Continuous    Question Answer Comment  In the event of cardiac or respiratory ARREST Do not call a "code blue"   In the event of cardiac or respiratory ARREST Do not perform Intubation, CPR, defibrillation or ACLS   In the event of cardiac or respiratory ARREST Use medication by any route, position, wound care, and other measures to relive pain and suffering. May use oxygen, suction and manual treatment of airway obstruction as needed for comfort.      01/30/18 2223    Code Status History    Date Active Date Inactive Code Status Order ID Comments User Context   07/18/2014 01:31 07/21/2014 18:52 Full Code [id number]  Phillips Grout, MD  Inpatient   05/11/2014 00:14 05/13/2014 13:11 Full Code [id number]  Phillips Grout, MD Inpatient   05/19/2012 17:41 05/22/2012 20:56 DNR [id number]  Gracy Bruins, RN Inpatient    Advance Directive Documentation     Most Recent Value  Type of Advance Directive  Healthcare Power of Attorney  Pre-existing out of facility DNR order (yellow form or pink MOST form)  No data  "MOST" Form in Place?  No data       Prognosis:   Unable to determine  Discharge Planning:  To Be Determined likely will need SNF for rehab  Care plan was discussed with patient, son, two daughters, daughter-in-law, Dr. Lonny Prude, Dr Joelyn Oms  Thank you for allowing the Palliative Medicine Team to assist in the care of this patient.   Time In: 0900 Time Out: 1020 Total Time 29min Prolonged Time Billed yes      Greater than 50%  of this time was spent counseling and coordinating care related to the above assessment and plan.  Ihor Dow, FNP-C Palliative Medicine Team  Phone: [phone] Fax: [phone]  Please contact Palliative Medicine Team phone at [phone] for questions and concerns.

## 2018-02-05 NOTE — Progress Notes (Signed)
PHARMACY - PHYSICIAN COMMUNICATION CRITICAL VALUE ALERT - BLOOD CULTURE IDENTIFICATION (BCID)  Melissa Mccullough is an 84 y.o. female who presented to [facility] on 01/30/2018 with a chief complaint of diarrhea  Assessment:  The patient is being treated for CDiff with Vanc po and is noted to have 1 of 2 blood cultures from 2/16 now growing GPR in the anaerobic bottle. BCID was negative - this could represent a slow-growing skin contaminant and likely does not warrant any additional coverage.   Name of physician (or Provider) ContactedLonny Prude (via text page)  Current antibiotics: None  Changes to prescribed antibiotics recommended: None- continue Vancomycin po for CDiff  Results for orders placed or performed during the hospital encounter of 01/30/18  Blood Culture ID Panel (Reflexed) (Collected: 01/30/2018  4:32 PM)  Result Value Ref Range   Enterococcus species NOT DETECTED NOT DETECTED   Listeria monocytogenes NOT DETECTED NOT DETECTED   Staphylococcus species NOT DETECTED NOT DETECTED   Staphylococcus aureus NOT DETECTED NOT DETECTED   Streptococcus species NOT DETECTED NOT DETECTED   Streptococcus agalactiae NOT DETECTED NOT DETECTED   Streptococcus pneumoniae NOT DETECTED NOT DETECTED   Streptococcus pyogenes NOT DETECTED NOT DETECTED   Acinetobacter baumannii NOT DETECTED NOT DETECTED   Enterobacteriaceae species NOT DETECTED NOT DETECTED   Enterobacter cloacae complex NOT DETECTED NOT DETECTED   Escherichia coli NOT DETECTED NOT DETECTED   Klebsiella oxytoca NOT DETECTED NOT DETECTED   Klebsiella pneumoniae NOT DETECTED NOT DETECTED   Proteus species NOT DETECTED NOT DETECTED   Serratia marcescens NOT DETECTED NOT DETECTED   Haemophilus influenzae NOT DETECTED NOT DETECTED   Neisseria meningitidis NOT DETECTED NOT DETECTED   Pseudomonas aeruginosa NOT DETECTED NOT DETECTED   Candida albicans NOT DETECTED NOT DETECTED   Candida glabrata NOT DETECTED NOT DETECTED   Candida  krusei NOT DETECTED NOT DETECTED   Candida parapsilosis NOT DETECTED NOT DETECTED   Candida tropicalis NOT DETECTED NOT DETECTED    Lawson Radar 02/05/2018  8:14 AM

## 2018-02-05 NOTE — Progress Notes (Signed)
Admit: 01/30/2018 LOS: 6  3F with AKI 2/2 hypovolemia (CDI) + ACEi + Diuretic  Subjective:   Met Again with family and palliative care  Patient shares a unified goal that we should not pursue heroic measures; and wonder if we need to monitor renal function in the hospital or if this can be done safely outside of the hospital  They clearly state that if her renal function were to worsen they would not choose dialysis; something I think is very reasonable  Continues to have significant urine output, exceeding intake; SCr slightly improevd this AM  K 4.6 on Veltassa  02/21 0701 - 02/22 0700 In: 2520 [P.O.:600; I.V.:1920] Out: 6025 [Urine:6025]  Filed Weights   01/30/18 1222 01/31/18 0620  Weight: 54.4 kg (120 lb) 75.2 kg (165 lb 12.6 oz)    Scheduled Meds: . cholecalciferol  1,000 Units Oral Daily  . cycloSPORINE  1 drop Both Eyes BID  . dorzolamide-timolol  1 drop Both Eyes BID  . ezetimibe  10 mg Oral Daily  . gabapentin  100 mg Oral QHS  . heparin  5,000 Units Subcutaneous Q8H  . insulin aspart  0-9 Units Subcutaneous TID WC  . latanoprost  1 drop Both Eyes QHS  . levothyroxine  25 mcg Oral QAC breakfast  . LORazepam  0.5 mg Oral BID  . omega-3 acid ethyl esters  1 g Oral BID  . patiromer  16.8 g Oral Daily  . sodium chloride flush  3 mL Intravenous Q12H  . vancomycin  125 mg Oral Q6H  . vitamin C  1,000 mg Oral Daily  . vitamin E  400 Units Oral Daily   Continuous Infusions:  PRN Meds:.acetaminophen **OR** acetaminophen, HYDROcodone-acetaminophen, ondansetron **OR** ondansetron (ZOFRAN) IV  Current Labs: reviewed   Physical Exam:  Blood pressure (!) 141/50, pulse 71, temperature (!) 97.5 F (36.4 C), temperature source Oral, resp. rate 17, height 5' (1.524 m), weight 75.2 kg (165 lb 12.6 oz), SpO2 93 %. RRR CTAB No LEE S/nt/nd, +BS No rashes/lesions EOMI NCAT Foley catheter in place  A 1. AKI, normal baseline 2/2 hypovolemia (CDI + thiazide) + ACEi use;  stable / slightly improved today, unclear why it worsened: not obstructed, good UOP.  2. Hyperkalemia on Patiromer, no med explanation; improving 3. CDI on Vanco per TRH; stool frequency/volume improving 4. DM2 holding metformin 5. Memory loss / Delirium; much more cogent today  P 1. Stop IV fluids  2. Leave Foley catheter in place to carefully measure urine output  3. Continue to monitor daily renal panel  4. Begin to look for skilled nursing facility for rehabilitation  5. We will continue to follow renal function from the clinic when she discharges 6. Daily weights, Daily Renal Panel, Strict I/Os, Avoid nephrotoxins (NSAIDs, judicious IV Contrast) 7. Will cont to follow   Pearson Grippe MD 02/05/2018, 10:46 AM  Recent Labs  Lab 02/03/18 0425  02/04/18 0645 02/04/18 1619 02/05/18 0430  NA 131*  --  132*  --  134*  K 4.9  5.4*   < > 5.3* 4.8 4.6  CL 95*  --  95*  --  99*  CO2 23  --  21*  --  21*  GLUCOSE 95  --  85  --  113*  BUN 69*  --  75*  --  73*  CREATININE 5.50*  --  6.46*  --  6.03*  CALCIUM 6.9*  --  8.0*  --  8.4*  PHOS 4.9*  --  6.0*  --  6.0*   < > = values in this interval not displayed.   Recent Labs  Lab 01/30/18 1426  02/01/18 0205 02/02/18 0220 02/03/18 0425  WBC 14.5*   < > 10.0 10.8* 9.4  NEUTROABS 10.3*  --   --   --   --   HGB 11.9*   < > 10.0* 11.3* 9.5*  HCT 36.4   < > 30.0* 34.0* 29.7*  MCV 98.6   < > 94.9 96.0 96.4  PLT 246   < > 207 204 191   < > = values in this interval not displayed.

## 2018-02-05 NOTE — Progress Notes (Signed)
Physical Therapy Treatment Patient Details Name: Melissa Mccullough MRN: [id number] DOB: 09-18-1928 Today's Date: 02/05/2018    History of Present Illness Melissa Mccullough is a 84 y.o. female with medical history significant of type 2 diabetes on oral medication, hypertension, glaucoma, hyperlipidemia, hypothyroidism who comes in with diarrhea for approximately 1 week.    PT Comments    Pt is showing progress towards goals, requiring less assist for mobility this session. She continues to be limited by lethargy and confusion. Plan to progress gait in next session. Pt uses rollator at baseline. Will continue to follow acutely.    Follow Up Recommendations  SNF;Supervision/Assistance - 24 hour     Equipment Recommendations  None recommended by PT    Recommendations for Other Services       Precautions / Restrictions Precautions Precautions: Fall Restrictions Weight Bearing Restrictions: No    Mobility  Bed Mobility Overal bed mobility: Needs Assistance Bed Mobility: Supine to Sit     Supine to sit: +2 for physical assistance;+2 for safety/equipment;Mod assist     General bed mobility comments: Pt able to initiate LE movement with cueing. Mod A to elevate trunk and progress hips forward.   Transfers Overall transfer level: Needs assistance Equipment used: Rolling walker (2 wheeled) Transfers: Sit to/from [facility] Sit to Stand: +2 physical assistance;Min assist Stand pivot transfers: Min assist;+2 physical assistance;+2 safety/equipment       General transfer comment: Pt stood from EOB with min A +2 to power up. Min A +2 to steady during transfer. Cues for safe hand placement  Ambulation/Gait                 Stairs            Wheelchair Mobility    Modified Rankin (Stroke Patients Only)       Balance Overall balance assessment: Needs assistance Sitting-balance support: Feet supported Sitting balance-Leahy Scale: Fair Sitting  balance - Comments: static sitting EOB    Standing balance support: Bilateral upper extremity supported Standing balance-Leahy Scale: Poor Standing balance comment: reliant on UE support/external support                             Cognition Arousal/Alertness: Lethargic Behavior During Therapy: Flat affect(can become slighly agitated with questions) Overall Cognitive Status: No family/caregiver present to determine baseline cognitive functioning Area of Impairment: Attention;Memory;Safety/judgement;Awareness                   Current Attention Level: Sustained Memory: Decreased short-term memory   Safety/Judgement: Decreased awareness of safety;Decreased awareness of deficits Awareness: Emergent   General Comments: Pt requireing cues to keep eyes open, Multimodal cues to follow instructions. Increased time.      Exercises      General Comments        Pertinent Vitals/Pain Pain Assessment: No/denies pain    Home Living                      Prior Function            PT Goals (current goals can now be found in the care plan section) Acute Rehab PT Goals Patient Stated Goal: none stated at this time  PT Goal Formulation: Patient unable to participate in goal setting Time For Goal Achievement: 02/17/18 Potential to Achieve Goals: Good Progress towards PT goals: Progressing toward goals    Frequency    Min 2X/week  PT Plan Current plan remains appropriate    Co-evaluation              AM-PAC PT "6 Clicks" Daily Activity  Outcome Measure  Difficulty turning over in bed (including adjusting bedclothes, sheets and blankets)?: Unable Difficulty moving from lying on back to sitting on the side of the bed? : Unable Difficulty sitting down on and standing up from a chair with arms (e.g., wheelchair, bedside commode, etc,.)?: Unable Help needed moving to and from a bed to chair (including a wheelchair)?: A Little Help needed  walking in hospital room?: A Lot Help needed climbing 3-5 steps with a railing? : Total 6 Click Score: 9    End of Session Equipment Utilized During Treatment: Gait belt Activity Tolerance: Patient limited by lethargy Patient left: in chair;with call bell/phone within reach;with chair alarm set;with family/visitor present Nurse Communication: Mobility status PT Visit Diagnosis: Unsteadiness on feet (R26.81);Muscle weakness (generalized) (M62.81)     Time: 5615-3794 PT Time Calculation (min) (ACUTE ONLY): 14 min  Charges:  $Therapeutic Activity: 8-22 mins                    G Codes:       Benjiman Core, [place] Pager [phone] Acute Rehab   Allena Katz 02/05/2018, 2:05 PM

## 2018-02-06 LAB — RENAL FUNCTION PANEL
ALBUMIN: 2.3 g/dL — AB (ref 3.5–5.0)
Anion gap: 14 (ref 5–15)
BUN: 82 mg/dL — AB (ref 6–20)
CALCIUM: 8.2 mg/dL — AB (ref 8.9–10.3)
CO2: 20 mmol/L — ABNORMAL LOW (ref 22–32)
CREATININE: 4.9 mg/dL — AB (ref 0.44–1.00)
Chloride: 100 mmol/L — ABNORMAL LOW (ref 101–111)
GFR calc Af Amer: 8 mL/min — ABNORMAL LOW (ref >60)
GFR, EST NON AFRICAN AMERICAN: 7 mL/min — AB (ref >60)
Glucose, Bld: 163 mg/dL — ABNORMAL HIGH (ref 65–99)
PHOSPHORUS: 4.8 mg/dL — AB (ref 2.5–4.6)
Potassium: 4.2 mmol/L (ref 3.5–5.1)
SODIUM: 134 mmol/L — AB (ref 135–145)

## 2018-02-06 LAB — CULTURE, BLOOD (ROUTINE X 2): SPECIAL REQUESTS: ADEQUATE

## 2018-02-06 LAB — GLUCOSE, CAPILLARY
GLUCOSE-CAPILLARY: 163 mg/dL — AB (ref 65–99)
Glucose-Capillary: 167 mg/dL — ABNORMAL HIGH (ref 65–99)
Glucose-Capillary: 196 mg/dL — ABNORMAL HIGH (ref 65–99)

## 2018-02-06 NOTE — Progress Notes (Signed)
Admit: 01/30/2018 LOS: 7  80F with AKI 2/2 hypovolemia (CDI) + ACEi + Diuretic  Subjective:    SCr improved this AM, K OK at 4.2  Still on veltassa  No new issues   02/22 0701 - 02/23 0700 In: 356 [P.O.:356] Out: 4151 [Urine:4150; Stool:1]  Filed Weights   01/30/18 1222 01/31/18 0620  Weight: 54.4 kg (120 lb) 75.2 kg (165 lb 12.6 oz)    Scheduled Meds: . cholecalciferol  1,000 Units Oral Daily  . cycloSPORINE  1 drop Both Eyes BID  . dorzolamide-timolol  1 drop Both Eyes BID  . ezetimibe  10 mg Oral Daily  . gabapentin  100 mg Oral QHS  . heparin  5,000 Units Subcutaneous Q8H  . insulin aspart  0-9 Units Subcutaneous TID WC  . latanoprost  1 drop Both Eyes QHS  . levothyroxine  25 mcg Oral QAC breakfast  . LORazepam  0.5 mg Oral BID  . omega-3 acid ethyl esters  1 g Oral BID  . patiromer  16.8 g Oral Daily  . sodium chloride flush  3 mL Intravenous Q12H  . vancomycin  125 mg Oral Q6H  . vitamin C  1,000 mg Oral Daily  . vitamin E  400 Units Oral Daily   Continuous Infusions:  PRN Meds:.acetaminophen **OR** acetaminophen, HYDROcodone-acetaminophen, ondansetron **OR** ondansetron (ZOFRAN) IV  Current Labs: reviewed   Physical Exam:  Blood pressure (!) 144/85, pulse 82, temperature 98.3 F (36.8 C), resp. rate 18, height 5' (1.524 m), weight 75.2 kg (165 lb 12.6 oz), SpO2 98 %. RRR CTAB No LEE S/nt/nd, +BS No rashes/lesions EOMI NCAT Foley catheter in place  A 1. AKI, normal baseline 2/2 hypovolemia (CDI + thiazide) + ACEi use; stable / slightly improved today, unclear why it worsened: not obstructed, good UOP.  2. Hyperkalemia on Patiromer, no med explanation; improving 3. CDI on Vanco per TRH; stool frequency/volume improving 4. DM2 holding metformin 5. Memory loss / Delirium; much more cogent today  P 1. HOld veltassa 2. Leave Foley catheter in place to carefully measure urine output  3. Continue to monitor daily renal panel  4. For SNF next  week 5. We will continue to follow renal function from the clinic when she discharges 6. Daily weights, Daily Renal Panel, Strict I/Os, Avoid nephrotoxins (NSAIDs, judicious IV Contrast) 7. Will cont to follow   Pearson Grippe MD 02/06/2018, 8:58 AM  Recent Labs  Lab 02/04/18 0645 02/04/18 1619 02/05/18 0430 02/06/18 0354  NA 132*  --  134* 134*  K 5.3* 4.8 4.6 4.2  CL 95*  --  99* 100*  CO2 21*  --  21* 20*  GLUCOSE 85  --  113* 163*  BUN 75*  --  73* 82*  CREATININE 6.46*  --  6.03* 4.90*  CALCIUM 8.0*  --  8.4* 8.2*  PHOS 6.0*  --  6.0* 4.8*   Recent Labs  Lab 01/30/18 1426  02/01/18 0205 02/02/18 0220 02/03/18 0425  WBC 14.5*   < > 10.0 10.8* 9.4  NEUTROABS 10.3*  --   --   --   --   HGB 11.9*   < > 10.0* 11.3* 9.5*  HCT 36.4   < > 30.0* 34.0* 29.7*  MCV 98.6   < > 94.9 96.0 96.4  PLT 246   < > 207 204 191   < > = values in this interval not displayed.

## 2018-02-06 NOTE — Progress Notes (Signed)
PROGRESS NOTE    Melissa Mccullough  WFU:[id number] DOB: 01-07-1928 DOA: 01/30/2018 PCP: System, Pcp Not In   Brief Narrative: Melissa Mccullough is a 84 y.o. femalewith medical history significant oftype 2 diabetes on oral medication, hypertension, glaucoma, hyperlipidemia, hypothyroidism who comes in with diarrhea for approximately 1 week. Per report of the patient and her family she began to have nausea, vomiting, and diarrhea approximately 8 days ago. She was seen in the ER where she was noted to be dehydrated and treated symptomatically. Per the family, her nausea and vomiting resolved. Her diarrhea continued however. She began to have multiple bowel movements a day. There was never any blood frank blood in the bowel movements. On discussion with the patient, she reports that she had crampy abdominal pain that was intermittent but wax and wane throughout the day. She began to have innumerable bowel movements and family brought her back to the ED at [facility] In the ED, she was noted to be in renal failure with Cr 4.3 and potassium 7. She was given IV zosyn and oral vanco for concern of infectious colitis. Nephrology was consulted and patient started on IV sodium bicarb. She tested positive for C Diff. Urine output has significantly improved with continued worsening of creatinine.   Assessment & Plan:   Principal Problem:   Acute renal insufficiency Active Problems:   Essential hypertension   Hyperlipidemia   Hypothyroidism   Renal failure   Sepsis ([place])   Diarrhea   Pressure injury of skin   Hyperkalemia   Clostridium difficile infection   Palliative care by specialist   Goals of care, counseling/discussion   C. Difficile colitis Appears to be improved. -Continue Vancomycin PO  Acute kidney injury Likely secondary to hypovolemia in setting of significant diarrhea in addition to diuretic/ACEi use. Creatinine worsened but has had excellent urine  output. -Nephrology recommendations: IV fluids discontinued -BMP in AM  Hyperkalemia Secondary to kidney injury. Improved. -Nephology recommendations: Holding Veltassa  Confusion Unknown etiology. Afebrile. Normal WBC. Patient with underlying progressive memory loss. Improved. Urinalysis does not suggest infection.  Diabetes mellitus, type 2 Last Hemoglobin A1C of 7.4% in 2015. Currently well controlled. -Holding glipizide, metformin and hydrochlorothiazide -Continue SSI  Essential hypertension -Continue to hold amlodipine, lisinopril, hydrochlorothiazide  Hypothyroidism -Continue Synthroid  Hyperlipidemia -Continue Zetia  Cirrhosis Seen on CT abdomen. Outpatient follow-up depending on goals of care   DVT prophylaxis: Heparin Code Status:   Code Status: DNR Family Communication: Son, and two daughters at bedside Disposition Plan: SNF when bed available and improvement in renal function   Consultants:   Nephrology  Palliative care medicine  Procedures:   None  Antimicrobials:  Vancomycin    Subjective: Foot pain today.  Objective: Vitals:   02/05/18 1153 02/05/18 1423 02/05/18 2118 02/06/18 0546  BP:  (!) 140/55 (!) 147/70 (!) 144/85  Pulse: 79 94 86 82  Resp:  16 18 18   Temp:  97.6 F (36.4 C) 98.3 F (36.8 C) 98.3 F (36.8 C)  TempSrc:  Oral    SpO2:  95% 94% 98%  Weight:      Height:        Intake/Output Summary (Last 24 hours) at 02/06/2018 2025 Last data filed at 02/06/2018 0545 Gross per 24 hour  Intake 356 ml  Output 3351 ml  Net -2995 ml   Filed Weights   01/30/18 1222 01/31/18 0620  Weight: 54.4 kg (120 lb) 75.2 kg (165 lb 12.6 oz)  Examination:  General exam: Appears calm and comfortable Respiratory system: Normal respiratory effort Central nervous system: Alert and oriented to person.    Data Reviewed: I have personally reviewed following labs and imaging studies  CBC: Recent Labs  Lab 01/30/18 1426 01/31/18 0241  02/01/18 0205 02/02/18 0220 02/03/18 0425  WBC 14.5* 12.5* 10.0 10.8* 9.4  NEUTROABS 10.3*  --   --   --   --   HGB 11.9* 10.3* 10.0* 11.3* 9.5*  HCT 36.4 31.3* 30.0* 34.0* 29.7*  MCV 98.6 96.9 94.9 96.0 96.4  PLT 246 225 207 204 270   Basic Metabolic Panel: Recent Labs  Lab 02/02/18 1531  02/03/18 0425  02/04/18 0321 02/04/18 0645 02/04/18 1619 02/05/18 0430 02/06/18 0354  NA 129*  --  131*  --   --  132*  --  134* 134*  K 5.5*  5.4*   < > 4.9  5.4*   < > 5.2* 5.3* 4.8 4.6 4.2  CL 88*  --  95*  --   --  95*  --  99* 100*  CO2 26  --  23  --   --  21*  --  21* 20*  GLUCOSE 100*  --  95  --   --  85  --  113* 163*  BUN 78*  --  69*  --   --  75*  --  73* 82*  CREATININE 6.07*  --  5.50*  --   --  6.46*  --  6.03* 4.90*  CALCIUM 7.9*  --  6.9*  --   --  8.0*  --  8.4* 8.2*  PHOS 4.9*  --  4.9*  --   --  6.0*  --  6.0* 4.8*   < > = values in this interval not displayed.   GFR: Estimated Creatinine Clearance: 7.3 mL/min (A) (by C-G formula based on SCr of 4.9 mg/dL (H)). Liver Function Tests: Recent Labs  Lab 01/30/18 1426 01/31/18 0241 02/01/18 0205  02/02/18 1531 02/03/18 0425 02/04/18 0645 02/05/18 0430 02/06/18 0354  AST 29 31 29   --   --   --   --   --   --   ALT 26 19 18   --   --   --   --   --   --   ALKPHOS 64 48 46  --   --   --   --   --   --   BILITOT 0.6 0.7 0.7  --   --   --   --   --   --   PROT 7.2 5.6* 4.8*  --   --   --   --   --   --   ALBUMIN 3.1* 2.2* 1.9*  1.9*   < > 2.1* 2.0* 2.4* 2.5* 2.3*   < > = values in this interval not displayed.   Recent Labs  Lab 01/30/18 1426  LIPASE 48   No results for input(s): AMMONIA in the last 168 hours. Coagulation Profile: No results for input(s): INR, PROTIME in the last 168 hours. Cardiac Enzymes: No results for input(s): CKTOTAL, CKMB, CKMBINDEX, TROPONINI in the last 168 hours. BNP (last 3 results) No results for input(s): PROBNP in the last 8760 hours. HbA1C: No results for input(s): HGBA1C  in the last 72 hours. CBG: Recent Labs  Lab 02/05/18 0751 02/05/18 1154 02/05/18 1632 02/05/18 2118 02/06/18 0751  GLUCAP 95 96 205* 171* 163*   Lipid Profile: No  results for input(s): CHOL, HDL, LDLCALC, TRIG, CHOLHDL, LDLDIRECT in the last 72 hours. Thyroid Function Tests: No results for input(s): TSH, T4TOTAL, FREET4, T3FREE, THYROIDAB in the last 72 hours. Anemia Panel: No results for input(s): VITAMINB12, FOLATE, FERRITIN, TIBC, IRON, RETICCTPCT in the last 72 hours. Sepsis Labs: Recent Labs  Lab 01/30/18 1646 01/30/18 1828 01/31/18 1426 01/31/18 1735  LATICACIDVEN 3.44* 4.60* 2.6* 2.4*    Recent Results (from the past 240 hour(s))  Blood Culture (routine x 2)     Status: None (Preliminary result)   Collection Time: 01/30/18  4:32 PM  Result Value Ref Range Status   Specimen Description BLOOD RIGHT WRIST  Final   Special Requests   Final    BOTTLES DRAWN AEROBIC AND ANAEROBIC Blood Culture adequate volume Performed at [facility], [place] [place], [place] [address]    Culture  Setup Time   Final    GRAM POSITIVE RODS ANAEROBIC BOTTLE ONLY CRITICAL RESULT CALLED TO, READ BACK BY AND VERIFIED WITH: E MARTIN,PHARMD AT 0807 02/05/18 BY L BENFIELD    Culture   Final    GRAM POSITIVE RODS CULTURE REINCUBATED FOR BETTER GROWTH Performed at [facility], [place] [address] [place], [place] [address]    Report Status PENDING  Incomplete  Blood Culture ID Panel (Reflexed)     Status: None   Collection Time: 01/30/18  4:32 PM  Result Value Ref Range Status   Enterococcus species NOT DETECTED NOT DETECTED Final   Listeria monocytogenes NOT DETECTED NOT DETECTED Final   Staphylococcus species NOT DETECTED NOT DETECTED Final   Staphylococcus aureus NOT DETECTED NOT DETECTED Final   Streptococcus species NOT DETECTED NOT DETECTED Final   Streptococcus agalactiae NOT DETECTED NOT DETECTED Final   Streptococcus pneumoniae NOT DETECTED NOT DETECTED  Final   Streptococcus pyogenes NOT DETECTED NOT DETECTED Final   Acinetobacter baumannii NOT DETECTED NOT DETECTED Final   Enterobacteriaceae species NOT DETECTED NOT DETECTED Final   Enterobacter cloacae complex NOT DETECTED NOT DETECTED Final   Escherichia coli NOT DETECTED NOT DETECTED Final   Klebsiella oxytoca NOT DETECTED NOT DETECTED Final   Klebsiella pneumoniae NOT DETECTED NOT DETECTED Final   Proteus species NOT DETECTED NOT DETECTED Final   Serratia marcescens NOT DETECTED NOT DETECTED Final   Haemophilus influenzae NOT DETECTED NOT DETECTED Final   Neisseria meningitidis NOT DETECTED NOT DETECTED Final   Pseudomonas aeruginosa NOT DETECTED NOT DETECTED Final   Candida albicans NOT DETECTED NOT DETECTED Final   Candida glabrata NOT DETECTED NOT DETECTED Final   Candida krusei NOT DETECTED NOT DETECTED Final   Candida parapsilosis NOT DETECTED NOT DETECTED Final   Candida tropicalis NOT DETECTED NOT DETECTED Final    Comment: Performed at [facility], [place] [address], [place], [place] [address]  Blood Culture (routine x 2)     Status: None   Collection Time: 01/30/18  4:36 PM  Result Value Ref Range Status   Specimen Description BLOOD RIGHT HAND  Final   Special Requests   Final    BOTTLES DRAWN AEROBIC AND ANAEROBIC Blood Culture adequate volume Performed at [facility], [place] [place], [place] [address]    Culture   Final    NO GROWTH 5 DAYS Performed at [facility], [place] [address], [place],  [address]    Report Status 02/04/2018 FINAL  Final  MRSA PCR Screening     Status: None  Collection Time: 01/30/18 10:16 PM  Result Value Ref Range Status   MRSA by PCR NEGATIVE NEGATIVE Final    Comment:        The GeneXpert MRSA Assay (FDA approved for NASAL specimens only), is one component of a comprehensive MRSA colonization surveillance program. It is not intended to diagnose MRSA infection nor to guide or monitor treatment  for MRSA infections. Performed at [facility], [place] [address] [place], [place] [address]   Gastrointestinal Panel by PCR , Stool     Status: None   Collection Time: 01/31/18  9:35 AM  Result Value Ref Range Status   Campylobacter species NOT DETECTED NOT DETECTED Final   Plesimonas shigelloides NOT DETECTED NOT DETECTED Final   Salmonella species NOT DETECTED NOT DETECTED Final   Yersinia enterocolitica NOT DETECTED NOT DETECTED Final   Vibrio species NOT DETECTED NOT DETECTED Final   Vibrio cholerae NOT DETECTED NOT DETECTED Final   Enteroaggregative E coli (EAEC) NOT DETECTED NOT DETECTED Final   Enteropathogenic E coli (EPEC) NOT DETECTED NOT DETECTED Final   Enterotoxigenic E coli (ETEC) NOT DETECTED NOT DETECTED Final   Shiga like toxin producing E coli (STEC) NOT DETECTED NOT DETECTED Final   Shigella/Enteroinvasive E coli (EIEC) NOT DETECTED NOT DETECTED Final   Cryptosporidium NOT DETECTED NOT DETECTED Final   Cyclospora cayetanensis NOT DETECTED NOT DETECTED Final   Entamoeba histolytica NOT DETECTED NOT DETECTED Final   Giardia lamblia NOT DETECTED NOT DETECTED Final   Adenovirus F40/41 NOT DETECTED NOT DETECTED Final   Astrovirus NOT DETECTED NOT DETECTED Final   Norovirus GI/GII NOT DETECTED NOT DETECTED Final   Rotavirus A NOT DETECTED NOT DETECTED Final   Sapovirus (I, II, IV, and V) NOT DETECTED NOT DETECTED Final    Comment: Performed at [facility], [place] [place], [place] [address]  C difficile quick scan w PCR reflex     Status: Abnormal   Collection Time: 01/31/18  9:36 AM  Result Value Ref Range Status   C Diff antigen POSITIVE (A) NEGATIVE Final   C Diff toxin NEGATIVE NEGATIVE Final   C Diff interpretation Results are indeterminate. See PCR results.  Final    Comment: Performed at [facility], [place] [address], [place], [place] [address]  C. Diff by PCR, Reflexed     Status: Abnormal   Collection Time: 01/31/18  9:36 AM    Result Value Ref Range Status   Toxigenic C. Difficile by PCR POSITIVE (A) NEGATIVE Final    Comment: Positive for toxigenic C. difficile with little to no toxin production. Only treat if clinical presentation suggests symptomatic illness. Performed at [facility], [place] [address] [place], [place] [address]   Culture, Urine     Status: Abnormal   Collection Time: 02/03/18  3:28 PM  Result Value Ref Range Status   Specimen Description URINE, CATHETERIZED  Final   Special Requests   Final    NONE Performed at [facility], [place] [address], [place], [place] [address]    Culture 40,000 COLONIES/mL ENTEROCOCCUS FAECALIS (A)  Final   Report Status 02/05/2018 FINAL  Final   Organism ID, Bacteria ENTEROCOCCUS FAECALIS (A)  Final      Susceptibility   Enterococcus faecalis - MIC*    AMPICILLIN <=2 SENSITIVE Sensitive     LEVOFLOXACIN 1 SENSITIVE Sensitive     NITROFURANTOIN <=16 SENSITIVE Sensitive     VANCOMYCIN 1 SENSITIVE Sensitive     *  40,000 COLONIES/mL ENTEROCOCCUS FAECALIS         Radiology Studies: No results found.      Scheduled Meds: . cholecalciferol  1,000 Units Oral Daily  . cycloSPORINE  1 drop Both Eyes BID  . dorzolamide-timolol  1 drop Both Eyes BID  . ezetimibe  10 mg Oral Daily  . gabapentin  100 mg Oral QHS  . heparin  5,000 Units Subcutaneous Q8H  . insulin aspart  0-9 Units Subcutaneous TID WC  . latanoprost  1 drop Both Eyes QHS  . levothyroxine  25 mcg Oral QAC breakfast  . LORazepam  0.5 mg Oral BID  . omega-3 acid ethyl esters  1 g Oral BID  . sodium chloride flush  3 mL Intravenous Q12H  . vancomycin  125 mg Oral Q6H  . vitamin C  1,000 mg Oral Daily  . vitamin E  400 Units Oral Daily   Continuous Infusions:    LOS: 7 days     Cordelia Poche, MD [facility] 02/06/2018, 9:22 AM Pager: [phone]  If 7PM-7AM, please contact night-coverage [email or web address] Password [email or web address] 02/06/2018, 9:22 AM

## 2018-02-06 NOTE — Plan of Care (Signed)
Progressing

## 2018-02-07 LAB — RENAL FUNCTION PANEL
ALBUMIN: 2.3 g/dL — AB (ref 3.5–5.0)
ANION GAP: 11 (ref 5–15)
BUN: 87 mg/dL — ABNORMAL HIGH (ref 6–20)
CALCIUM: 8.5 mg/dL — AB (ref 8.9–10.3)
CO2: 22 mmol/L (ref 22–32)
Chloride: 100 mmol/L — ABNORMAL LOW (ref 101–111)
Creatinine, Ser: 3.9 mg/dL — ABNORMAL HIGH (ref 0.44–1.00)
GFR calc non Af Amer: 9 mL/min — ABNORMAL LOW (ref >60)
GFR, EST AFRICAN AMERICAN: 11 mL/min — AB (ref >60)
Glucose, Bld: 148 mg/dL — ABNORMAL HIGH (ref 65–99)
PHOSPHORUS: 3.9 mg/dL (ref 2.5–4.6)
Potassium: 4 mmol/L (ref 3.5–5.1)
SODIUM: 133 mmol/L — AB (ref 135–145)

## 2018-02-07 LAB — GLUCOSE, CAPILLARY
GLUCOSE-CAPILLARY: 148 mg/dL — AB (ref 65–99)
GLUCOSE-CAPILLARY: 163 mg/dL — AB (ref 65–99)
GLUCOSE-CAPILLARY: 180 mg/dL — AB (ref 65–99)
Glucose-Capillary: 207 mg/dL — ABNORMAL HIGH (ref 65–99)
Glucose-Capillary: 152 mg/dL — ABNORMAL HIGH (ref 65–99)

## 2018-02-07 MED ORDER — HYDROCERIN EX CREA
TOPICAL_CREAM | Freq: Two times a day (BID) | CUTANEOUS | Status: DC
  Administered 2018-02-07 (×2): via TOPICAL
  Administered 2018-02-08: 1 via TOPICAL
  Filled 2018-02-07: qty 113

## 2018-02-07 NOTE — Progress Notes (Signed)
Admit: 01/30/2018 LOS: 8  23F with AKI 2/2 hypovolemia (CDI) + ACEi + Diuretic  Subjective:    Awake alert NAD today  SCr improved this AM, K OK at 4.0  off veltassa  No new issues   02/23 0701 - 02/24 0700 In: 480 [P.O.:480] Out: 2000 [Urine:2000]  Filed Weights   01/30/18 1222 01/31/18 0620  Weight: 54.4 kg (120 lb) 75.2 kg (165 lb 12.6 oz)    Scheduled Meds: . cholecalciferol  1,000 Units Oral Daily  . cycloSPORINE  1 drop Both Eyes BID  . dorzolamide-timolol  1 drop Both Eyes BID  . ezetimibe  10 mg Oral Daily  . gabapentin  100 mg Oral QHS  . heparin  5,000 Units Subcutaneous Q8H  . hydrocerin   Topical BID  . insulin aspart  0-9 Units Subcutaneous TID WC  . latanoprost  1 drop Both Eyes QHS  . levothyroxine  25 mcg Oral QAC breakfast  . LORazepam  0.5 mg Oral BID  . omega-3 acid ethyl esters  1 g Oral BID  . sodium chloride flush  3 mL Intravenous Q12H  . vancomycin  125 mg Oral Q6H  . vitamin C  1,000 mg Oral Daily  . vitamin E  400 Units Oral Daily   Continuous Infusions:  PRN Meds:.acetaminophen **OR** acetaminophen, HYDROcodone-acetaminophen, ondansetron **OR** ondansetron (ZOFRAN) IV  Current Labs: reviewed   Physical Exam:  Blood pressure (!) 119/46, pulse 80, temperature 97.8 F (36.6 C), temperature source Oral, resp. rate 17, height 5' (1.524 m), weight 75.2 kg (165 lb 12.6 oz), SpO2 100 %. RRR CTAB No LEE S/nt/nd, +BS No rashes/lesions EOMI NCAT Foley catheter in place  A 1. AKI, normal baseline 2/2 hypovolemia (CDI + thiazide) + ACEi use; now clealry improving;  2. Hyperkalemia on Patirom[place], no med explanation; improving 3. CDI on Vanco per TRH; stool frequency/volume improving 4. DM2 holding metformin 5. Memory loss / Delirium; much more cogent today  P 1. Foley per pt and PMD 2. We will continue to follow renal function from the clinic when she discharges 3. Daily weights, Daily Renal Panel, Strict I/Os, Avoid nephrotoxins  (NSAIDs, judicious IV Contrast) 4. Will sign off for now.  Please call with any questions or concerns.  Pt does not need follow up with nephrology given goals of care; I will set up labs to follow at least once next week.   Pearson Grippe MD 02/07/2018, 10:08 AM  Recent Labs  Lab 02/05/18 0430 02/06/18 0354 02/07/18 0158  NA 134* 134* 133*  K 4.6 4.2 4.0  CL 99* 100* 100*  CO2 21* 20* 22  GLUCOSE 113* 163* 148*  BUN 73* 82* 87*  CREATININE 6.03* 4.90* 3.90*  CALCIUM 8.4* 8.2* 8.5*  PHOS 6.0* 4.8* 3.9   Recent Labs  Lab 02/01/18 0205 02/02/18 0220 02/03/18 0425  WBC 10.0 10.8* 9.4  HGB 10.0* 11.3* 9.5*  HCT 30.0* 34.0* 29.7*  MCV 94.9 96.0 96.4  PLT 207 204 191

## 2018-02-07 NOTE — Progress Notes (Signed)
PROGRESS NOTE    Melissa Mccullough  XHB:[id number] DOB: [birth year] DOA: 01/30/2018 PCP: System, Pcp Not In   Brief Narrative: Melissa Mccullough is a 84 y.o. femalewith medical history significant oftype 2 diabetes on oral medication, hypertension, glaucoma, hyperlipidemia, hypothyroidism who comes in with diarrhea for approximately 1 week. Per report of the patient and her family she began to have nausea, vomiting, and diarrhea approximately 8 days ago. She was seen in the ER where she was noted to be dehydrated and treated symptomatically. Per the family, her nausea and vomiting resolved. Her diarrhea continued however. She began to have multiple bowel movements a day. There was never any blood frank blood in the bowel movements. On discussion with the patient, she reports that she had crampy abdominal pain that was intermittent but wax and wane throughout the day. She began to have innumerable bowel movements and family brought her back to the ED at [facility]. In the ED, she was noted to be in renal failure with Cr 4.3 and potassium 7. She was given IV zosyn and oral vanco for concern of infectious colitis. Nephrology was consulted and patient started on IV sodium bicarb. She tested positive for C Diff. Urine output has significantly improved and creatinine is improving as well. Plan for SNF discharge.   Assessment & Plan:   Principal Problem:   Acute renal insufficiency Active Problems:   Essential hypertension   Hyperlipidemia   Hypothyroidism   Renal failure   Sepsis ([place])   Diarrhea   Pressure injury of skin   Hyperkalemia   Clostridium difficile infection   Palliative care by specialist   Goals of care, counseling/discussion   C. Difficile colitis Appears to be improved. -Continue Vancomycin PO  Acute kidney injury Likely secondary to hypovolemia in setting of significant diarrhea in addition to diuretic/ACEi use. Creatinine worsened but has had excellent  urine output. -Nephrology recommendations: IV fluids discontinued -BMP in AM  Hyperkalemia Secondary to kidney injury. Improved. -Nephology recommendations: Holding Veltassa  Confusion Unknown etiology. Afebrile. Normal WBC. Patient with underlying progressive memory loss. Improved. Urinalysis does not suggest infection.  Diabetes mellitus, type 2 Last Hemoglobin A1C of 7.4% in 2015. Currently well controlled. -Holding glipizide, metformin and hydrochlorothiazide -Continue SSI  Essential hypertension Blood pressure adequately controlled. -Continue to hold amlodipine, lisinopril, hydrochlorothiazide  Hypothyroidism -Continue Synthroid  Hyperlipidemia -Continue Zetia  Cirrhosis Seen on CT abdomen. Outpatient follow-up depending on goals of care  Bacteruria Enterococcus positive. Asymptomatic. Sensitive to ampicillin if develops symptoms.   DVT prophylaxis: Heparin Code Status:   Code Status: DNR Family Communication: None at bedside Disposition Plan: SNF when bed available and improvement in renal function   Consultants:   Nephrology  Palliative care medicine  Procedures:   None  Antimicrobials:  Vancomycin    Subjective: No issues overnight. Itchy.  Objective: Vitals:   02/06/18 0546 02/06/18 1322 02/06/18 2301 02/07/18 0648  BP: (!) 144/85 (!) 163/64 (!) 122/51 (!) 119/46  Pulse: 82 80 75 80  Resp: 18 20 17 17   Temp: 98.3 F (36.8 C) (!) 97.4 F (36.3 C) 97.7 F (36.5 C) 97.8 F (36.6 C)  TempSrc:   Oral Oral  SpO2: 98% 96% 100% 100%  Weight:      Height:        Intake/Output Summary (Last 24 hours) at 02/07/2018 0931 Last data filed at 02/07/2018 8101 Gross per 24 hour  Intake 480 ml  Output 2000 ml  Net -1520 ml  Filed Weights   01/30/18 1222 01/31/18 0620  Weight: 54.4 kg (120 lb) 75.2 kg (165 lb 12.6 oz)    Examination:  General exam: Calm/comfortable Respiratory system: Normal respiratory effort    Data Reviewed: I have  personally reviewed following labs and imaging studies  CBC: Recent Labs  Lab 02/01/18 0205 02/02/18 0220 02/03/18 0425  WBC 10.0 10.8* 9.4  HGB 10.0* 11.3* 9.5*  HCT 30.0* 34.0* 29.7*  MCV 94.9 96.0 96.4  PLT 207 204 025   Basic Metabolic Panel: Recent Labs  Lab 02/03/18 0425  02/04/18 0645 02/04/18 1619 02/05/18 0430 02/06/18 0354 02/07/18 0158  NA 131*  --  132*  --  134* 134* 133*  K 4.9  5.4*   < > 5.3* 4.8 4.6 4.2 4.0  CL 95*  --  95*  --  99* 100* 100*  CO2 23  --  21*  --  21* 20* 22  GLUCOSE 95  --  85  --  113* 163* 148*  BUN 69*  --  75*  --  73* 82* 87*  CREATININE 5.50*  --  6.46*  --  6.03* 4.90* 3.90*  CALCIUM 6.9*  --  8.0*  --  8.4* 8.2* 8.5*  PHOS 4.9*  --  6.0*  --  6.0* 4.8* 3.9   < > = values in this interval not displayed.   GFR: Estimated Creatinine Clearance: 9.2 mL/min (A) (by C-G formula based on SCr of 3.9 mg/dL (H)). Liver Function Tests: Recent Labs  Lab 02/01/18 0205  02/03/18 0425 02/04/18 0645 02/05/18 0430 02/06/18 0354 02/07/18 0158  AST 29  --   --   --   --   --   --   ALT 18  --   --   --   --   --   --   ALKPHOS 46  --   --   --   --   --   --   BILITOT 0.7  --   --   --   --   --   --   PROT 4.8*  --   --   --   --   --   --   ALBUMIN 1.9*  1.9*   < > 2.0* 2.4* 2.5* 2.3* 2.3*   < > = values in this interval not displayed.   No results for input(s): LIPASE, AMYLASE in the last 168 hours. No results for input(s): AMMONIA in the last 168 hours. Coagulation Profile: No results for input(s): INR, PROTIME in the last 168 hours. Cardiac Enzymes: No results for input(s): CKTOTAL, CKMB, CKMBINDEX, TROPONINI in the last 168 hours. BNP (last 3 results) No results for input(s): PROBNP in the last 8760 hours. HbA1C: No results for input(s): HGBA1C in the last 72 hours. CBG: Recent Labs  Lab 02/06/18 0751 02/06/18 1213 02/06/18 1711 02/06/18 2302 02/07/18 0732  GLUCAP 163* 167* 196* 207* 148*   Lipid Profile: No  results for input(s): CHOL, HDL, LDLCALC, TRIG, CHOLHDL, LDLDIRECT in the last 72 hours. Thyroid Function Tests: No results for input(s): TSH, T4TOTAL, FREET4, T3FREE, THYROIDAB in the last 72 hours. Anemia Panel: No results for input(s): VITAMINB12, FOLATE, FERRITIN, TIBC, IRON, RETICCTPCT in the last 72 hours. Sepsis Labs: Recent Labs  Lab 01/31/18 1426 01/31/18 1735  LATICACIDVEN 2.6* 2.4*    Recent Results (from the past 240 hour(s))  Blood Culture (routine x 2)     Status: Abnormal   Collection Time: 01/30/18  4:32  PM  Result Value Ref Range Status   Specimen Description BLOOD RIGHT WRIST  Final   Special Requests   Final    BOTTLES DRAWN AEROBIC AND ANAEROBIC Blood Culture adequate volume Performed at [facility], [address] [place], [place] [address]    Culture  Setup Time   Final    GRAM POSITIVE RODS ANAEROBIC BOTTLE ONLY CRITICAL RESULT CALLED TO, READ BACK BY AND VERIFIED WITH: E MARTIN,PHARMD AT 3500 02/05/18 BY L BENFIELD Performed at [facility], [place] [address] [place], [place] [address]    Culture PROPIONIBACTERIUM ACNES (A)  Final   Report Status 02/06/2018 FINAL  Final  Blood Culture ID Panel (Reflexed)     Status: None   Collection Time: 01/30/18  4:32 PM  Result Value Ref Range Status   Enterococcus species NOT DETECTED NOT DETECTED Final   Listeria monocytogenes NOT DETECTED NOT DETECTED Final   Staphylococcus species NOT DETECTED NOT DETECTED Final   Staphylococcus aureus NOT DETECTED NOT DETECTED Final   Streptococcus species NOT DETECTED NOT DETECTED Final   Streptococcus agalactiae NOT DETECTED NOT DETECTED Final   Streptococcus pneumoniae NOT DETECTED NOT DETECTED Final   Streptococcus pyogenes NOT DETECTED NOT DETECTED Final   Acinetobacter baumannii NOT DETECTED NOT DETECTED Final   Enterobacteriaceae species NOT DETECTED NOT DETECTED Final   Enterobacter cloacae complex NOT DETECTED NOT DETECTED Final   Escherichia coli  NOT DETECTED NOT DETECTED Final   Klebsiella oxytoca NOT DETECTED NOT DETECTED Final   Klebsiella pneumoniae NOT DETECTED NOT DETECTED Final   Proteus species NOT DETECTED NOT DETECTED Final   Serratia marcescens NOT DETECTED NOT DETECTED Final   Haemophilus influenzae NOT DETECTED NOT DETECTED Final   Neisseria meningitidis NOT DETECTED NOT DETECTED Final   Pseudomonas aeruginosa NOT DETECTED NOT DETECTED Final   Candida albicans NOT DETECTED NOT DETECTED Final   Candida glabrata NOT DETECTED NOT DETECTED Final   Candida krusei NOT DETECTED NOT DETECTED Final   Candida parapsilosis NOT DETECTED NOT DETECTED Final   Candida tropicalis NOT DETECTED NOT DETECTED Final    Comment: Performed at [facility], [place]. [address], [place], [place] [address]  Blood Culture (routine x 2)     Status: None   Collection Time: 01/30/18  4:36 PM  Result Value Ref Range Status   Specimen Description BLOOD RIGHT HAND  Final   Special Requests   Final    BOTTLES DRAWN AEROBIC AND ANAEROBIC Blood Culture adequate volume Performed at [facility], [address] [place], [place] [address]    Culture   Final    NO GROWTH 5 DAYS Performed at [facility], [place] [address] [place], [place] [address]    Report Status 02/04/2018 FINAL  Final  MRSA PCR Screening     Status: None   Collection Time: 01/30/18 10:16 PM  Result Value Ref Range Status   MRSA by PCR NEGATIVE NEGATIVE Final    Comment:        The GeneXpert MRSA Assay (FDA approved for NASAL specimens only), is one component of a comprehensive MRSA colonization surveillance program. It is not intended to diagnose MRSA infection nor to guide or monitor treatment for MRSA infections. Performed at [facility], [place] [address] [place], [place] [address]   Gastrointestinal Panel by PCR , Stool     Status: None   Collection Time: 01/31/18  9:35 AM  Result Value Ref Range  Status   Campylobacter species NOT DETECTED NOT  DETECTED Final   Plesimonas shigelloides NOT DETECTED NOT DETECTED Final   Salmonella species NOT DETECTED NOT DETECTED Final   Yersinia enterocolitica NOT DETECTED NOT DETECTED Final   Vibrio species NOT DETECTED NOT DETECTED Final   Vibrio cholerae NOT DETECTED NOT DETECTED Final   Enteroaggregative E coli (EAEC) NOT DETECTED NOT DETECTED Final   Enteropathogenic E coli (EPEC) NOT DETECTED NOT DETECTED Final   Enterotoxigenic E coli (ETEC) NOT DETECTED NOT DETECTED Final   Shiga like toxin producing E coli (STEC) NOT DETECTED NOT DETECTED Final   Shigella/Enteroinvasive E coli (EIEC) NOT DETECTED NOT DETECTED Final   Cryptosporidium NOT DETECTED NOT DETECTED Final   Cyclospora cayetanensis NOT DETECTED NOT DETECTED Final   Entamoeba histolytica NOT DETECTED NOT DETECTED Final   Giardia lamblia NOT DETECTED NOT DETECTED Final   Adenovirus F40/41 NOT DETECTED NOT DETECTED Final   Astrovirus NOT DETECTED NOT DETECTED Final   Norovirus GI/GII NOT DETECTED NOT DETECTED Final   Rotavirus A NOT DETECTED NOT DETECTED Final   Sapovirus (I, II, IV, and V) NOT DETECTED NOT DETECTED Final    Comment: Performed at [facility], [address] [place], [place] [address]  C difficile quick scan w PCR reflex     Status: Abnormal   Collection Time: 01/31/18  9:36 AM  Result Value Ref Range Status   C Diff antigen POSITIVE (A) NEGATIVE Final   C Diff toxin NEGATIVE NEGATIVE Final   C Diff interpretation Results are indeterminate. See PCR results.  Final    Comment: Performed at [facility], [place] [address], [place], [place] [address]  C. Diff by PCR, Reflexed     Status: Abnormal   Collection Time: 01/31/18  9:36 AM  Result Value Ref Range Status   Toxigenic C. Difficile by PCR POSITIVE (A) NEGATIVE Final    Comment: Positive for toxigenic C. difficile with little to no toxin production. Only treat if clinical presentation suggests symptomatic illness. Performed at [facility], [place] [address] [place],  [address]   Culture, Urine     Status: Abnormal   Collection Time: 02/03/18  3:28 PM  Result Value Ref Range Status   Specimen Description URINE, CATHETERIZED  Final   Special Requests   Final    NONE Performed at [facility], [place] [address] [place], [place] [address]    Culture 40,000 COLONIES/mL ENTEROCOCCUS FAECALIS (A)  Final   Report Status 02/05/2018 FINAL  Final   Organism ID, Bacteria ENTEROCOCCUS FAECALIS (A)  Final      Susceptibility   Enterococcus faecalis - MIC*    AMPICILLIN <=2 SENSITIVE Sensitive     LEVOFLOXACIN 1 SENSITIVE Sensitive     NITROFURANTOIN <=16 SENSITIVE Sensitive     VANCOMYCIN 1 SENSITIVE Sensitive     * 40,000 COLONIES/mL ENTEROCOCCUS FAECALIS         Radiology Studies: No results found.      Scheduled Meds: . cholecalciferol  1,000 Units Oral Daily  . cycloSPORINE  1 drop Both Eyes BID  . dorzolamide-timolol  1 drop Both Eyes BID  . ezetimibe  10 mg Oral Daily  . gabapentin  100 mg Oral QHS  . heparin  5,000 Units Subcutaneous Q8H  . hydrocerin   Topical BID  . insulin aspart  0-9 Units Subcutaneous TID WC  . latanoprost  1 drop Both Eyes QHS  . levothyroxine  25 mcg  Oral QAC breakfast  . LORazepam  0.5 mg Oral BID  . omega-3 acid ethyl esters  1 g Oral BID  . sodium chloride flush  3 mL Intravenous Q12H  . vancomycin  125 mg Oral Q6H  . vitamin C  1,000 mg Oral Daily  . vitamin E  400 Units Oral Daily   Continuous Infusions:    LOS: 8 days     Cordelia Poche, MD [facility] 02/07/2018, 9:31 AM Pager: [phone]  If 7PM-7AM, please contact night-coverage [email or web address] Password [facility] 02/07/2018, 9:31 AM

## 2018-02-07 NOTE — Plan of Care (Signed)
Progressing

## 2018-02-08 LAB — RENAL FUNCTION PANEL
ALBUMIN: 2.5 g/dL — AB (ref 3.5–5.0)
ANION GAP: 11 (ref 5–15)
BUN: 77 mg/dL — AB (ref 6–20)
CALCIUM: 9.1 mg/dL (ref 8.9–10.3)
CO2: 22 mmol/L (ref 22–32)
CREATININE: 2.87 mg/dL — AB (ref 0.44–1.00)
Chloride: 102 mmol/L (ref 101–111)
GFR calc Af Amer: 16 mL/min — ABNORMAL LOW (ref >60)
GFR calc non Af Amer: 14 mL/min — ABNORMAL LOW (ref >60)
GLUCOSE: 146 mg/dL — AB (ref 65–99)
PHOSPHORUS: 3.8 mg/dL (ref 2.5–4.6)
Potassium: 4.2 mmol/L (ref 3.5–5.1)
SODIUM: 135 mmol/L (ref 135–145)

## 2018-02-08 LAB — GLUCOSE, CAPILLARY
GLUCOSE-CAPILLARY: 182 mg/dL — AB (ref 65–99)
Glucose-Capillary: 166 mg/dL — ABNORMAL HIGH (ref 65–99)
Glucose-Capillary: 185 mg/dL — ABNORMAL HIGH (ref 65–99)

## 2018-02-08 MED ORDER — LEVOTHYROXINE SODIUM 25 MCG PO TABS: 50.0000 ug | ORAL_TABLET | Freq: Every day | ORAL | Status: DC

## 2018-02-08 MED ORDER — LORAZEPAM 0.5 MG PO TABS: 0.2500 mg | ORAL_TABLET | Freq: Two times a day (BID) | ORAL | 0 refills | Status: DC

## 2018-02-08 MED ORDER — VANCOMYCIN 50 MG/ML ORAL SOLUTION: 125.0000 mg | Freq: Four times a day (QID) | ORAL | Status: AC

## 2018-02-08 MED ORDER — HYDROCODONE-ACETAMINOPHEN 5-325 MG PO TABS: 1.0000 | ORAL_TABLET | ORAL | 0 refills | Status: DC | PRN

## 2018-02-08 MED ORDER — GABAPENTIN 100 MG PO CAPS: 100.0000 mg | ORAL_CAPSULE | Freq: Every day | ORAL | Status: DC

## 2018-02-08 NOTE — Discharge Summary (Addendum)
Physician Discharge Summary  Melissa Mccullough WNI:[id number] DOB: 1928-01-07 DOA: 01/30/2018  PCP: System, Pcp Not In  Admit date: 01/30/2018 Discharge date: 02/08/2018  Admitted From: Home Disposition: SNF  Recommendations for Outpatient Follow-up:  1. Please obtain BMP/CBC in one week 2. Please follow up on the following pending results: None  Home Health: None Equipment/Devices: None  Discharge Condition: Stable CODE STATUS: DNR Diet recommendation: Regular diet   Brief/Interim Summary:  Admission HPI written by Thomes Dinning, MD   Chief Complaint: Diarrhea  HPI: Melissa Mccullough is a 84 y.o. female with medical history significant of type 2 diabetes on oral medication, hypertension, glaucoma, hyperlipidemia, hypothyroidism who comes in with diarrhea for approximately 1 week.  Per report of the patient and her family she began to have nausea vomiting and diarrhea approximately 8 days ago.  She was seen in the ER where she was noted to be dehydrated and treated symptomatically.  Per the family her nausea vomiting resolved.  It but was never bloody or bilious.  Her diarrhea continued however.  She began to have multiple bowel movements a day.  There was never any blood frank blood in the bowel movements.  On discussion with the patient she reports that she had crampy abdominal pain that was intermittent but wax and wane throughout the day.  The pain did not radiate.  She would have some relief of the pain with having a bowel movement.  Eating made the pain worse.  She also endorsed tenesmus.  She began to have innumerable bowel movements and family brought her back to the ED at [facility].  She denied any fevers, rash, travel, chest pain, shortness of breath, hematemesis, hematochezia, paroxysmal nocturnal dyspnea, lower extremity edema.   ED Course: In the ED patient met sepsis criteria based on her heart rate and elevated white blood cell count of 14.5 and elevated  lactate.  She was noted to be in acute renal failure with potassium of 7.2 and creatinine of 4.3.  Lactate was 3.4 and rose to 4.6.  Patient was given sepsis bolus fluids, a dose of Zosyn and started on oral vancomycin.  Nephrology was consulted and recommended sodium bicarbonate.  CT of the abdomen did not show any acute process.  Repeat potassium was down to 5.6.  EKG reportedly did not show any acute ST segment changes.    Hospital course:  C. Difficile colitis Patient started on Vancomycin by mouth with good clinical response. End date of 2/27.  Acute kidney injury Likely secondary to hypovolemia in setting of significant diarrhea in addition to diuretic/ACEi use. Creatinine initially worsened with good urine output. Eventually, creatinine started to improve. Recommend repeat BMP as an outpatient.  Hyperkalemia Secondary to kidney injury. Treated with Veltassa and has remained stable once Veltassa discontinued.  Uremia Secondary to AKI. Resolved.  Diabetes mellitus, type 2 Last Hemoglobin A1C of 7.4% in 2015. Currently well controlled. Discontinued medications on discharge.  Essential hypertension Blood pressure adequately controlled. Discharge on no medications. Watch for hypertension.  Hypothyroidism Continued Synthroid  Hyperlipidemia Continued Zetia  Cirrhosis Seen on CT abdomen. Asymptomatic. Goals of care would dictate no follow-up  Bacteriuria Enterococcus positive. Asymptomatic. Sensitive to ampicillin if develops symptoms. Treat as needed related to symptoms.  Leg pain Chronic. Norco and gabapentin (renally dosed)  Pressure ulcer Right heel, stage 1    Discharge Diagnoses:  Principal Problem:   Acute renal insufficiency Active Problems:   Essential hypertension   Hyperlipidemia   Hypothyroidism  Renal failure   Sepsis (HCC)   Diarrhea   Pressure injury of skin   Hyperkalemia   Clostridium difficile infection   Palliative care by  specialist   Goals of care, counseling/discussion    Discharge Instructions   Allergies as of 02/08/2018      Reactions   Codeine Rash   Macrobid [nitrofurantoin Macrocrystal] Rash      Medication List    STOP taking these medications   amLODipine 5 MG tablet Commonly known as:  NORVASC   gabapentin 600 MG tablet Commonly known as:  NEURONTIN Replaced by:  gabapentin 100 MG capsule   glipiZIDE 2.5 MG 24 hr tablet Commonly known as:  GLUCOTROL XL   hydrochlorothiazide 25 MG tablet Commonly known as:  HYDRODIURIL   lisinopril 10 MG tablet Commonly known as:  PRINIVIL,ZESTRIL   metFORMIN 1000 MG tablet Commonly known as:  GLUCOPHAGE   pioglitazone 15 MG tablet Commonly known as:  ACTOS     TAKE these medications   aspirin EC 81 MG tablet Take 81 mg by mouth daily with lunch.   brimonidine 0.2 % ophthalmic solution Commonly known as:  ALPHAGAN Place 1 drop into the right eye 3 (three) times daily.   cycloSPORINE 0.05 % ophthalmic emulsion Commonly known as:  RESTASIS Place 1 drop into both eyes every 12 (twelve) hours.   dorzolamide-timolol 22.3-6.8 MG/ML ophthalmic solution Commonly known as:  COSOPT Place 1 drop into both eyes 2 (two) times daily.   ezetimibe 10 MG tablet Commonly known as:  ZETIA Take 10 mg by mouth daily.   FISH OIL PO Take 1 capsule by mouth daily.   gabapentin 100 MG capsule Commonly known as:  NEURONTIN Take 1 capsule (100 mg total) by mouth at bedtime. Replaces:  gabapentin 600 MG tablet   GENTEAL TEARS NIGHT-TIME Oint Place 1 application into both eyes at bedtime.   HYDROcodone-acetaminophen 5-325 MG tablet Commonly known as:  NORCO/VICODIN Take 1 tablet by mouth every 4 (four) hours as needed for moderate pain.   ICAPS AREDS 2 PO Take 1 capsule by mouth daily.   ONE-A-DAY PROACTIVE 65+ Tabs Take 1 tablet by mouth daily.   latanoprost 0.005 % ophthalmic solution Commonly known as:  XALATAN Place 1 drop into both  eyes at bedtime.   levothyroxine 25 MCG tablet Commonly known as:  SYNTHROID, LEVOTHROID Take 2 tablets (50 mcg total) by mouth daily before breakfast. What changed:  medication strength   LORazepam 0.5 MG tablet Commonly known as:  ATIVAN Take 0.5 tablets (0.25 mg total) by mouth 2 (two) times daily. EVENING and BEDTIME   vancomycin 50 mg/mL oral solution Commonly known as:  VANCOCIN Take 2.5 mLs (125 mg total) by mouth every 6 (six) hours for 2 days.      Contact information for after-discharge care    Destination    [facility] .   Service:  Skilled Nursing Contact information: [place] [address] [phone]             Allergies  Allergen Reactions  . Codeine Rash  . Macrobid [Nitrofurantoin Macrocrystal] Rash    Consultations:  Nephrology   Procedures/Studies: Ct Abdomen Pelvis Wo Contrast  Result Date: 01/30/2018 CLINICAL DATA:  Abdominal pain.  Suspect colitis. EXAM: CT ABDOMEN AND PELVIS WITHOUT CONTRAST TECHNIQUE: Multidetector CT imaging of the abdomen and pelvis was performed following the standard protocol without IV contrast. COMPARISON:  09/21/2014 FINDINGS: Lower chest: Moderate cardiac enlargement. Aortic atherosclerosis. Calcification in  the RCA and left circumflex coronary arteries noted. Small bilateral pleural effusions identified within the lung bases. Hepatobiliary: The liver has a nodular contour and there is hypertrophy of the lateral segment of left lobe of liver. No focal liver abnormality identified. Previous cholecystectomy. The proximal common bile duct measures up to 1.2 cm. Pancreas: Unremarkable. No pancreatic ductal dilatation or surrounding inflammatory changes. Spleen: Normal in size without focal abnormality. Adrenals/Urinary Tract: Normal adrenal glands. Chronic bilateral and symmetric perinephric soft tissue stranding compatible with prior insult. No mass or hydronephrosis. Urinary bladder  appears normal. Stomach/Bowel: No abnormal distension of the stomach. The small bowel loops have a normal caliber. The appendix is visualized and appears normal. There is no pathologic dilatation of the colon. No significant large bowel wall thickening or inflammation. Vascular/Lymphatic: Aortic atherosclerosis. No aneurysm. Scattered small upper abdominal lymph nodes identified but no adenopathy. No pelvic or inguinal adenopathy. Reproductive: Uterus and bilateral adnexa are unremarkable. Other: No significant free fluid or fluid collections identified within the abdomen or pelvis. Musculoskeletal: Previous left femur ORIF. The bones appear diffusely osteopenic. There is degenerative disc disease identified throughout the lower thoracic and lumbar spine. IMPRESSION: 1. Nodular liver compatible with cirrhosis. 2. Previous cholecystectomy with increase caliber of the CBD. No choledocholithiasis noted. 3. Cardiac enlargement, aortic atherosclerosis and coronary artery calcifications. Aortic Atherosclerosis (ICD10-I70.0). 4. Small pleural effusions. Electronically Signed   By: Kerby Moors M.D.   On: 01/30/2018 17:38   US Renal  Result Date: 02/02/2018 CLINICAL DATA:  Acute kidney injury. EXAM: RENAL / URINARY TRACT ULTRASOUND COMPLETE COMPARISON:  CT scan of January 30, 2018. FINDINGS: Right Kidney: Length: 10.4 cm. 7 mm cyst is seen in upper pole. Echogenicity within normal limits. No mass or hydronephrosis visualized. Left Kidney: Length: 10.9 cm. 1 cm cyst is seen in midpole. Echogenicity within normal limits. No mass or hydronephrosis visualized. Bladder: Nondistended secondary to Foley catheter. IMPRESSION: Bilateral simple renal cysts.  No other renal abnormality is noted. Electronically Signed   By: Marijo Conception, M.D.   On: 02/02/2018 13:44      Subjective: Some leg pain.  Discharge Exam: Vitals:   02/07/18 2100 02/08/18 0548  BP: (!) 117/47 131/78  Pulse: 72 71  Resp: 19 18  Temp: 98.4  F (36.9 C) 97.8 F (36.6 C)  SpO2: 92% 94%   Vitals:   02/07/18 0648 02/07/18 1309 02/07/18 2100 02/08/18 0548  BP: (!) 119/46 (!) 122/46 (!) 117/47 131/78  Pulse: 80 70 72 71  Resp: _0 Temp: 97.8 F (36.6 C) 97.7 F (36.5 C) 98.4 F (36.9 C) 97.8 F (36.6 C)  TempSrc: Oral Oral Oral   SpO2: 100% 97% 92% 94%  Weight:    68.1 kg (150 lb 2.1 oz)  Height:        General: Pt is alert, awake, not in acute distress Cardiovascular: RRR, S1/S2 +, no rubs, no gallops Respiratory: CTA bilaterally, no wheezing, no rhonchi Abdominal: Soft, NT, ND, bowel sounds +    The results of significant diagnostics from this hospitalization (including imaging, microbiology, ancillary and laboratory) are listed below for reference.     Microbiology: Recent Results (from the past 240 hour(s))  Blood Culture (routine x 2)     Status: Abnormal   Collection Time: 01/30/18  4:32 PM  Result Value Ref Range Status   Specimen Description BLOOD RIGHT WRIST  Final   Special Requests   Final    BOTTLES DRAWN AEROBIC AND ANAEROBIC  Blood Culture adequate volume Performed at [facility], [address] [place], [place] [address]    Culture  Setup Time   Final    GRAM POSITIVE RODS ANAEROBIC BOTTLE ONLY CRITICAL RESULT CALLED TO, READ BACK BY AND VERIFIED WITH: E MARTIN,PHARMD AT 3790 02/05/18 BY L BENFIELD Performed at [facility], [place] [address] [place], [place] [address]    Culture PROPIONIBACTERIUM ACNES (A)  Final   Report Status 02/06/2018 FINAL  Final  Blood Culture ID Panel (Reflexed)     Status: None   Collection Time: 01/30/18  4:32 PM  Result Value Ref Range Status   Enterococcus species NOT DETECTED NOT DETECTED Final   Listeria monocytogenes NOT DETECTED NOT DETECTED Final   Staphylococcus species NOT DETECTED NOT DETECTED Final   Staphylococcus aureus NOT DETECTED NOT DETECTED Final   Streptococcus species NOT DETECTED NOT DETECTED Final   Streptococcus  agalactiae NOT DETECTED NOT DETECTED Final   Streptococcus pneumoniae NOT DETECTED NOT DETECTED Final   Streptococcus pyogenes NOT DETECTED NOT DETECTED Final   Acinetobacter baumannii NOT DETECTED NOT DETECTED Final   Enterobacteriaceae species NOT DETECTED NOT DETECTED Final   Enterobacter cloacae complex NOT DETECTED NOT DETECTED Final   Escherichia coli NOT DETECTED NOT DETECTED Final   Klebsiella oxytoca NOT DETECTED NOT DETECTED Final   Klebsiella pneumoniae NOT DETECTED NOT DETECTED Final   Proteus species NOT DETECTED NOT DETECTED Final   Serratia marcescens NOT DETECTED NOT DETECTED Final   Haemophilus influenzae NOT DETECTED NOT DETECTED Final   Neisseria meningitidis NOT DETECTED NOT DETECTED Final   Pseudomonas aeruginosa NOT DETECTED NOT DETECTED Final   Candida albicans NOT DETECTED NOT DETECTED Final   Candida glabrata NOT DETECTED NOT DETECTED Final   Candida krusei NOT DETECTED NOT DETECTED Final   Candida parapsilosis NOT DETECTED NOT DETECTED Final   Candida tropicalis NOT DETECTED NOT DETECTED Final    Comment: Performed at [facility], [place]. [address], [place], [place] [address]  Blood Culture (routine x 2)     Status: None   Collection Time: 01/30/18  4:36 PM  Result Value Ref Range Status   Specimen Description BLOOD RIGHT HAND  Final   Special Requests   Final    BOTTLES DRAWN AEROBIC AND ANAEROBIC Blood Culture adequate volume Performed at [facility], [address] [place], [place] [address]    Culture   Final    NO GROWTH 5 DAYS Performed at [facility], [place] [address], [place], [place] [address]    Report Status 02/04/2018 FINAL  Final  MRSA PCR Screening     Status: None   Collection Time: 01/30/18 10:16 PM  Result Value Ref Range Status   MRSA by PCR NEGATIVE NEGATIVE Final    Comment:        The GeneXpert MRSA Assay (FDA approved for NASAL specimens only), is one component of a comprehensive MRSA  colonization surveillance program. It is not intended to diagnose MRSA infection nor to guide or monitor treatment for MRSA infections. Performed at [facility], [place] [address], [place],  [address]   Gastrointestinal Panel by PCR , Stool     Status: None   Collection Time: 01/31/18  9:35 AM  Result Value Ref Range Status   Campylobacter species NOT DETECTED NOT DETECTED Final   Plesimonas shigelloides NOT DETECTED NOT DETECTED Final   Salmonella species NOT DETECTED NOT DETECTED Final  Yersinia enterocolitica NOT DETECTED NOT DETECTED Final   Vibrio species NOT DETECTED NOT DETECTED Final   Vibrio cholerae NOT DETECTED NOT DETECTED Final   Enteroaggregative E coli (EAEC) NOT DETECTED NOT DETECTED Final   Enteropathogenic E coli (EPEC) NOT DETECTED NOT DETECTED Final   Enterotoxigenic E coli (ETEC) NOT DETECTED NOT DETECTED Final   Shiga like toxin producing E coli (STEC) NOT DETECTED NOT DETECTED Final   Shigella/Enteroinvasive E coli (EIEC) NOT DETECTED NOT DETECTED Final   Cryptosporidium NOT DETECTED NOT DETECTED Final   Cyclospora cayetanensis NOT DETECTED NOT DETECTED Final   Entamoeba histolytica NOT DETECTED NOT DETECTED Final   Giardia lamblia NOT DETECTED NOT DETECTED Final   Adenovirus F40/41 NOT DETECTED NOT DETECTED Final   Astrovirus NOT DETECTED NOT DETECTED Final   Norovirus GI/GII NOT DETECTED NOT DETECTED Final   Rotavirus A NOT DETECTED NOT DETECTED Final   Sapovirus (I, II, IV, and V) NOT DETECTED NOT DETECTED Final    Comment: Performed at [facility], [address] [place], [place] [address]  C difficile quick scan w PCR reflex     Status: Abnormal   Collection Time: 01/31/18  9:36 AM  Result Value Ref Range Status   C Diff antigen POSITIVE (A) NEGATIVE Final   C Diff toxin NEGATIVE NEGATIVE Final   C Diff interpretation Results are indeterminate. See PCR results.  Final    Comment: Performed at [facility], [place]  [address], [place], [place] [address]  C. Diff by PCR, Reflexed     Status: Abnormal   Collection Time: 01/31/18  9:36 AM  Result Value Ref Range Status   Toxigenic C. Difficile by PCR POSITIVE (A) NEGATIVE Final    Comment: Positive for toxigenic C. difficile with little to no toxin production. Only treat if clinical presentation suggests symptomatic illness. Performed at [facility], [place] [address], [place], [place] [address]   Culture, Urine     Status: Abnormal   Collection Time: 02/03/18  3:28 PM  Result Value Ref Range Status   Specimen Description URINE, CATHETERIZED  Final   Special Requests   Final    NONE Performed at [facility], [place] [address], [place], [place] [address]    Culture 40,000 COLONIES/mL ENTEROCOCCUS FAECALIS (A)  Final   Report Status 02/05/2018 FINAL  Final   Organism ID, Bacteria ENTEROCOCCUS FAECALIS (A)  Final      Susceptibility   Enterococcus faecalis - MIC*    AMPICILLIN <=2 SENSITIVE Sensitive     LEVOFLOXACIN 1 SENSITIVE Sensitive     NITROFURANTOIN <=16 SENSITIVE Sensitive     VANCOMYCIN 1 SENSITIVE Sensitive     * 40,000 COLONIES/mL ENTEROCOCCUS FAECALIS     Labs: BNP (last 3 results) No results for input(s): BNP in the last 8760 hours. Basic Metabolic Panel: Recent Labs  Lab 02/04/18 0645 02/04/18 1619 02/05/18 0430 02/06/18 0354 02/07/18 0158 02/08/18 0435  NA 132*  --  134* 134* 133* 135  K 5.3* 4.8 4.6 4.2 4.0 4.2  CL 95*  --  99* 100* 100* 102  CO2 21*  --  21* 20* 22 22  GLUCOSE 85  --  113* 163* 148* 146*  BUN 75*  --  73* 82* 87* 77*  CREATININE 6.46*  --  6.03* 4.90* 3.90* 2.87*  CALCIUM 8.0*  --  8.4* 8.2* 8.5* 9.1  PHOS 6.0*  --  6.0* 4.8* 3.9 3.8   Liver Function Tests: Recent  Labs  Lab 02/04/18 0645 02/05/18 0430 02/06/18 0354 02/07/18 0158 02/08/18 0435  ALBUMIN 2.4* 2.5* 2.3* 2.3* 2.5*   No results for input(s): LIPASE, AMYLASE in the last 168 hours. No results for input(s): AMMONIA in the last 168  hours. CBC: Recent Labs  Lab 02/02/18 0220 02/03/18 0425  WBC 10.8* 9.4  HGB 11.3* 9.5*  HCT 34.0* 29.7*  MCV 96.0 96.4  PLT 204 191   Cardiac Enzymes: No results for input(s): CKTOTAL, CKMB, CKMBINDEX, TROPONINI in the last 168 hours. BNP: Invalid input(s): POCBNP CBG: Recent Labs  Lab 02/07/18 0732 02/07/18 1157 02/07/18 1702 02/07/18 2346 02/08/18 0801  GLUCAP 148* 180* 152* 163* 166*   D-Dimer No results for input(s): DDIMER in the last 72 hours. Hgb A1c No results for input(s): HGBA1C in the last 72 hours. Lipid Profile No results for input(s): CHOL, HDL, LDLCALC, TRIG, CHOLHDL, LDLDIRECT in the last 72 hours. Thyroid function studies No results for input(s): TSH, T4TOTAL, T3FREE, THYROIDAB in the last 72 hours.  Invalid input(s): FREET3 Anemia work up No results for input(s): VITAMINB12, FOLATE, FERRITIN, TIBC, IRON, RETICCTPCT in the last 72 hours. Urinalysis    Component Value Date/Time   COLORURINE COLORLESS (A) 02/03/2018 1538   APPEARANCEUR CLEAR 02/03/2018 1538   LABSPEC 1.005 02/03/2018 1538   PHURINE 9.0 (H) 02/03/2018 1538   GLUCOSEU 50 (A) 02/03/2018 1538   HGBUR SMALL (A) 02/03/2018 1538   BILIRUBINUR NEGATIVE 02/03/2018 1538   KETONESUR NEGATIVE 02/03/2018 1538   PROTEINUR NEGATIVE 02/03/2018 1538   UROBILINOGEN 0.2 09/21/2014 1821   NITRITE NEGATIVE 02/03/2018 1538   LEUKOCYTESUR NEGATIVE 02/03/2018 1538   Sepsis Labs Invalid input(s): PROCALCITONIN,  WBC,  LACTICIDVEN Microbiology Recent Results (from the past 240 hour(s))  Blood Culture (routine x 2)     Status: Abnormal   Collection Time: 01/30/18  4:32 PM  Result Value Ref Range Status   Specimen Description BLOOD RIGHT WRIST  Final   Special Requests   Final    BOTTLES DRAWN AEROBIC AND ANAEROBIC Blood Culture adequate volume Performed at [facility], [address] [place], [place] [address]    Culture  Setup Time   Final    GRAM POSITIVE RODS ANAEROBIC BOTTLE  ONLY CRITICAL RESULT CALLED TO, READ BACK BY AND VERIFIED WITH: E MARTIN,PHARMD AT 9147 02/05/18 BY L BENFIELD Performed at [facility], [place] [address], [place], [place] [address]    Culture PROPIONIBACTERIUM ACNES (A)  Final   Report Status 02/06/2018 FINAL  Final  Blood Culture ID Panel (Reflexed)     Status: None   Collection Time: 01/30/18  4:32 PM  Result Value Ref Range Status   Enterococcus species NOT DETECTED NOT DETECTED Final   Listeria monocytogenes NOT DETECTED NOT DETECTED Final   Staphylococcus species NOT DETECTED NOT DETECTED Final   Staphylococcus aureus NOT DETECTED NOT DETECTED Final   Streptococcus species NOT DETECTED NOT DETECTED Final   Streptococcus agalactiae NOT DETECTED NOT DETECTED Final   Streptococcus pneumoniae NOT DETECTED NOT DETECTED Final   Streptococcus pyogenes NOT DETECTED NOT DETECTED Final   Acinetobacter baumannii NOT DETECTED NOT DETECTED Final   Enterobacteriaceae species NOT DETECTED NOT DETECTED Final   Enterobacter cloacae complex NOT DETECTED NOT DETECTED Final   Escherichia coli NOT DETECTED NOT DETECTED Final   Klebsiella oxytoca NOT DETECTED NOT DETECTED Final   Klebsiella pneumoniae NOT DETECTED NOT DETECTED Final   Proteus species NOT DETECTED NOT DETECTED Final   Serratia marcescens  NOT DETECTED NOT DETECTED Final   Haemophilus influenzae NOT DETECTED NOT DETECTED Final   Neisseria meningitidis NOT DETECTED NOT DETECTED Final   Pseudomonas aeruginosa NOT DETECTED NOT DETECTED Final   Candida albicans NOT DETECTED NOT DETECTED Final   Candida glabrata NOT DETECTED NOT DETECTED Final   Candida krusei NOT DETECTED NOT DETECTED Final   Candida parapsilosis NOT DETECTED NOT DETECTED Final   Candida tropicalis NOT DETECTED NOT DETECTED Final    Comment: Performed at [facility], [place] [address], [place], [place] [address]  Blood Culture (routine x 2)     Status: None   Collection Time: 01/30/18  4:36 PM  Result Value Ref  Range Status   Specimen Description BLOOD RIGHT HAND  Final   Special Requests   Final    BOTTLES DRAWN AEROBIC AND ANAEROBIC Blood Culture adequate volume Performed at [facility], [place] [place], [place] [address]    Culture   Final    NO GROWTH 5 DAYS Performed at [facility], [place] [address] [place], [place] [address]    Report Status 02/04/2018 FINAL  Final  MRSA PCR Screening     Status: None   Collection Time: 01/30/18 10:16 PM  Result Value Ref Range Status   MRSA by PCR NEGATIVE NEGATIVE Final    Comment:        The GeneXpert MRSA Assay (FDA approved for NASAL specimens only), is one component of a comprehensive MRSA colonization surveillance program. It is not intended to diagnose MRSA infection nor to guide or monitor treatment for MRSA infections. Performed at [facility], [place] [address] [place], [place] [address]   Gastrointestinal Panel by PCR , Stool     Status: None   Collection Time: 01/31/18  9:35 AM  Result Value Ref Range Status   Campylobacter species NOT DETECTED NOT DETECTED Final   Plesimonas shigelloides NOT DETECTED NOT DETECTED Final   Salmonella species NOT DETECTED NOT DETECTED Final   Yersinia enterocolitica NOT DETECTED NOT DETECTED Final   Vibrio species NOT DETECTED NOT DETECTED Final   Vibrio cholerae NOT DETECTED NOT DETECTED Final   Enteroaggregative E coli (EAEC) NOT DETECTED NOT DETECTED Final   Enteropathogenic E coli (EPEC) NOT DETECTED NOT DETECTED Final   Enterotoxigenic E coli (ETEC) NOT DETECTED NOT DETECTED Final   Shiga like toxin producing E coli (STEC) NOT DETECTED NOT DETECTED Final   Shigella/Enteroinvasive E coli (EIEC) NOT DETECTED NOT DETECTED Final   Cryptosporidium NOT DETECTED NOT DETECTED Final   Cyclospora cayetanensis NOT DETECTED NOT DETECTED Final   Entamoeba histolytica NOT DETECTED NOT DETECTED Final   Giardia lamblia NOT DETECTED NOT DETECTED Final   Adenovirus F40/41 NOT  DETECTED NOT DETECTED Final   Astrovirus NOT DETECTED NOT DETECTED Final   Norovirus GI/GII NOT DETECTED NOT DETECTED Final   Rotavirus A NOT DETECTED NOT DETECTED Final   Sapovirus (I, II, IV, and V) NOT DETECTED NOT DETECTED Final    Comment: Performed at [facility], [address] [place],  [address]  C difficile quick scan w PCR reflex     Status: Abnormal   Collection Time: 01/31/18  9:36 AM  Result Value Ref Range Status   C Diff antigen POSITIVE (A) NEGATIVE Final   C Diff toxin NEGATIVE NEGATIVE Final   C Diff interpretation Results are indeterminate. See PCR results.  Final    Comment: Performed at [facility], [address] [place], [place] [address]  C. Diff by PCR, Reflexed     Status: Abnormal   Collection Time: 01/31/18  9:36 AM  Result Value Ref Range Status   Toxigenic C. Difficile by PCR POSITIVE (A) NEGATIVE Final    Comment: Positive for toxigenic C. difficile with little to no toxin production. Only treat if clinical presentation suggests symptomatic illness. Performed at [facility], [place] [address], [place], [place] [address]   Culture, Urine     Status: Abnormal   Collection Time: 02/03/18  3:28 PM  Result Value Ref Range Status   Specimen Description URINE, CATHETERIZED  Final   Special Requests   Final    NONE Performed at [facility], [place] [address], [place], [place] [address]    Culture 40,000 COLONIES/mL ENTEROCOCCUS FAECALIS (A)  Final   Report Status 02/05/2018 FINAL  Final   Organism ID, Bacteria ENTEROCOCCUS FAECALIS (A)  Final      Susceptibility   Enterococcus faecalis - MIC*    AMPICILLIN <=2 SENSITIVE Sensitive     LEVOFLOXACIN 1 SENSITIVE Sensitive     NITROFURANTOIN <=16 SENSITIVE Sensitive     VANCOMYCIN 1 SENSITIVE Sensitive     * 40,000 COLONIES/mL ENTEROCOCCUS FAECALIS     Time coordinating discharge: Over 30 minutes  SIGNED:   Cordelia Poche, MD [facility] 02/08/2018, 12:26  PM Pager [phone]  If 7PM-7AM, please contact night-coverage [email or web address] Password [email or web address]

## 2018-02-08 NOTE — Progress Notes (Signed)
Pt prepared for d/c to SNF. Skin intact except as charted in most recent assessments. Vitals are stable. Report called to receiving facility. Pt to be transported by ambulance service, waiting for [facility] to arrive.  

## 2018-02-08 NOTE — Progress Notes (Addendum)
Patient will DC to: [place] Anticipated DC date: 02/08/18 Family notified: Daughter, [name] Transport by: [facility] 4:30pm (said they were behind)   Per MD patient ready for DC to [facility]. RN, patient, patient's family, and facility notified of DC. Discharge Summary sent to facility. RN given number for report [phone] Room 606P). DC packet on chart. Ambulance transport requested for patient.   CSW signing off.  Cedric Fishman, LCSW Clinical Social Worker [phone]

## 2018-02-08 NOTE — Discharge Instructions (Signed)
Clostridium Difficile Infection   Clostridium difficile (C. difficile or C. diff) infection causes inflammation of the large intestine (colon). This condition can result in damage to the lining of your colon and may lead to another condition called colitis. This infection can be passed from person to person (is contagious).  Follow these instructions at home:  Eating and drinking   · Drink enough fluid to keep your pee (urine) clear or pale yellow.  · Avoid drinking:  ? Milk.  ? Caffeine.  ? Alcohol.  · Follow exact instructions from your doctor about how to get enough fluid in your body (rehydrate).  · Eat small meals often instead of large meals.  Medicines   · Take your antibiotic medicine as told by your doctor. Do not stop taking the antibiotic even if you start to feel better unless your doctor told you to do that.  · Take over-the-counter and prescription medicines only as told by your doctor.  · Do not use medicines to help with watery poop (diarrhea).  General instructions   · Wash your hands fully before you prepare food and after you use the bathroom. Make sure people who live with you also wash their  · hands often.  · Clean the surfaces that you touch. Use a product that contains chlorine bleach.  · Keep all follow-up visits as told by your doctor. This is important.  Contact a doctor if:  · Your symptoms do not get better with treatment.  · Your symptoms get worse with treatment.  · Your symptoms go away and then come back.  · You have a fever.  · You have new symptoms.  Get help right away if:  · You have more pain or tenderness in your belly (abdomen).  · Your poop (stool) is mostly bloody.  · Your poop looks dark black and tarry.  · You cannot eat or drink without throwing up (vomiting).  · You have signs of dehydration, such as:  ? Dark pee, very little pee, or no pee.  ? Cracked lips.  ? Not making tears when you cry.  ? Dry mouth.  ? Sunken eyes.  ? Feeling sleepy.  ? Feeling weak.  ? Feeling  dizzy.  This information is not intended to replace advice given to you by your health care provider. Make sure you discuss any questions you have with your health care provider.  Document Released: 09/28/2009 Document Revised: 05/08/2016 Document Reviewed: 06/04/2015  Elsevier Interactive Patient Education © 2017 Elsevier Inc.

## 2018-02-08 NOTE — Clinical Social Work Placement (Signed)
   CLINICAL SOCIAL WORK PLACEMENT  NOTE  Date:  02/08/2018  Patient Details  Name: Melissa Mccullough MRN: [id number] Date of Birth: 11-16-1928  Clinical Social Work is seeking post-discharge placement for this patient at the [place] level of care (*CSW will initial, date and re-position this form in  chart as items are completed):  Yes   Patient/family provided with [place] Work Department's list of facilities offering this level of care within the geographic area requested by the patient (or if unable, by the patient's family).  Yes   Patient/family informed of their freedom to choose among providers that offer the needed level of care, that participate in Medicare, Medicaid or managed care program needed by the patient, have an available bed and are willing to accept the patient.  Yes   Patient/family informed of 's ownership interest in [facility] and [facility], as well as of the fact that they are under no obligation to receive care at these facilities.  PASRR submitted to EDS on       PASRR number received on       Existing PASRR number confirmed on 02/04/18     FL2 transmitted to all facilities in geographic area requested by pt/family on 02/04/18     FL2 transmitted to all facilities within larger geographic area on       Patient informed that his/her managed care company has contracts with or will negotiate with certain facilities, including the following:        Yes   Patient/family informed of bed offers received.  Patient chooses bed at [facility]     Physician recommends and patient chooses bed at      Patient to be transferred to [facility] on 02/08/18.  Patient to be transferred to facility by [facility]     Patient family notified on 02/08/18 of transfer.  Name of family member notified:  Daughter, [name]     PHYSICIAN Please prepare priority discharge summary, including medications     Additional  Comment:    _______________________________________________ Benard Halsted, [place] 02/08/2018, 11:41 AM

## 2018-02-08 NOTE — Progress Notes (Signed)
Patient alert discharged via stretcher by EMT, vitals stable denies pain.

## 2018-10-22 ENCOUNTER — Emergency Department (HOSPITAL_COMMUNITY): Payer: Medicare Other

## 2018-10-22 ENCOUNTER — Encounter (HOSPITAL_COMMUNITY): Payer: Self-pay | Admitting: Emergency Medicine

## 2018-10-22 ENCOUNTER — Inpatient Hospital Stay (HOSPITAL_COMMUNITY)
Admission: EM | Admit: 2018-10-22 | Discharge: 2018-10-25 | DRG: 871 | Disposition: A | Discharge status: Hospice | Payer: Medicare Other | Attending: Family Medicine | Admitting: Family Medicine

## 2018-10-22 DIAGNOSIS — E1140 Type 2 diabetes mellitus with diabetic neuropathy, unspecified: Secondary | ICD-10-CM | POA: Diagnosis present

## 2018-10-22 DIAGNOSIS — Z87891 Personal history of nicotine dependence: Secondary | ICD-10-CM

## 2018-10-22 DIAGNOSIS — R4182 Altered mental status, unspecified: Secondary | ICD-10-CM

## 2018-10-22 DIAGNOSIS — Z515 Encounter for palliative care: Secondary | ICD-10-CM | POA: Diagnosis present

## 2018-10-22 DIAGNOSIS — I10 Essential (primary) hypertension: Secondary | ICD-10-CM | POA: Diagnosis present

## 2018-10-22 DIAGNOSIS — E860 Dehydration: Secondary | ICD-10-CM | POA: Diagnosis present

## 2018-10-22 DIAGNOSIS — Z9049 Acquired absence of other specified parts of digestive tract: Secondary | ICD-10-CM

## 2018-10-22 DIAGNOSIS — Z885 Allergy status to narcotic agent status: Secondary | ICD-10-CM

## 2018-10-22 DIAGNOSIS — R6520 Severe sepsis without septic shock: Secondary | ICD-10-CM

## 2018-10-22 DIAGNOSIS — Z8249 Family history of ischemic heart disease and other diseases of the circulatory system: Secondary | ICD-10-CM | POA: Diagnosis not present

## 2018-10-22 DIAGNOSIS — E875 Hyperkalemia: Secondary | ICD-10-CM | POA: Diagnosis present

## 2018-10-22 DIAGNOSIS — E559 Vitamin D deficiency, unspecified: Secondary | ICD-10-CM | POA: Diagnosis present

## 2018-10-22 DIAGNOSIS — A419 Sepsis, unspecified organism: Principal | ICD-10-CM | POA: Diagnosis present

## 2018-10-22 DIAGNOSIS — Z7982 Long term (current) use of aspirin: Secondary | ICD-10-CM | POA: Diagnosis not present

## 2018-10-22 DIAGNOSIS — E890 Postprocedural hypothyroidism: Secondary | ICD-10-CM | POA: Diagnosis present

## 2018-10-22 DIAGNOSIS — Z794 Long term (current) use of insulin: Secondary | ICD-10-CM

## 2018-10-22 DIAGNOSIS — G9340 Encephalopathy, unspecified: Secondary | ICD-10-CM | POA: Diagnosis present

## 2018-10-22 DIAGNOSIS — D649 Anemia, unspecified: Secondary | ICD-10-CM | POA: Diagnosis present

## 2018-10-22 DIAGNOSIS — L89619 Pressure ulcer of right heel, unspecified stage: Secondary | ICD-10-CM | POA: Diagnosis present

## 2018-10-22 DIAGNOSIS — G9341 Metabolic encephalopathy: Secondary | ICD-10-CM | POA: Diagnosis present

## 2018-10-22 DIAGNOSIS — Z7401 Bed confinement status: Secondary | ICD-10-CM

## 2018-10-22 DIAGNOSIS — E785 Hyperlipidemia, unspecified: Secondary | ICD-10-CM | POA: Diagnosis present

## 2018-10-22 DIAGNOSIS — R1310 Dysphagia, unspecified: Secondary | ICD-10-CM | POA: Diagnosis present

## 2018-10-22 DIAGNOSIS — N179 Acute kidney failure, unspecified: Secondary | ICD-10-CM | POA: Diagnosis present

## 2018-10-22 DIAGNOSIS — Z881 Allergy status to other antibiotic agents status: Secondary | ICD-10-CM | POA: Diagnosis not present

## 2018-10-22 DIAGNOSIS — Z79890 Hormone replacement therapy (postmenopausal): Secondary | ICD-10-CM

## 2018-10-22 DIAGNOSIS — Z66 Do not resuscitate: Secondary | ICD-10-CM | POA: Diagnosis present

## 2018-10-22 DIAGNOSIS — I959 Hypotension, unspecified: Secondary | ICD-10-CM | POA: Diagnosis not present

## 2018-10-22 DIAGNOSIS — H409 Unspecified glaucoma: Secondary | ICD-10-CM | POA: Diagnosis present

## 2018-10-22 DIAGNOSIS — F0390 Unspecified dementia without behavioral disturbance: Secondary | ICD-10-CM | POA: Diagnosis present

## 2018-10-22 LAB — CBC WITH DIFFERENTIAL/PLATELET
ABS IMMATURE GRANULOCYTES: 0.16 10*3/uL — AB (ref 0.00–0.07)
BASOS ABS: 0.1 10*3/uL (ref 0.0–0.1)
BASOS PCT: 0 %
Eosinophils Absolute: 0.2 10*3/uL (ref 0.0–0.5)
Eosinophils Relative: 1 %
HCT: 32.8 % — ABNORMAL LOW (ref 36.0–46.0)
HEMOGLOBIN: 9.8 g/dL — AB (ref 12.0–15.0)
IMMATURE GRANULOCYTES: 1 %
LYMPHS PCT: 20 %
Lymphs Abs: 2.9 10*3/uL (ref 0.7–4.0)
MCH: 29.7 pg (ref 26.0–34.0)
MCHC: 29.9 g/dL — ABNORMAL LOW (ref 30.0–36.0)
MCV: 99.4 fL (ref 80.0–100.0)
Monocytes Absolute: 2 10*3/uL — ABNORMAL HIGH (ref 0.1–1.0)
Monocytes Relative: 14 %
NEUTROS ABS: 9.5 10*3/uL — AB (ref 1.7–7.7)
NEUTROS PCT: 64 %
NRBC: 0 % (ref 0.0–0.2)
Platelets: 286 10*3/uL (ref 150–400)
RBC: 3.3 MIL/uL — AB (ref 3.87–5.11)
RDW: 15.8 % — ABNORMAL HIGH (ref 11.5–15.5)
WBC: 14.8 10*3/uL — AB (ref 4.0–10.5)

## 2018-10-22 LAB — COMPREHENSIVE METABOLIC PANEL
ALT: 27 U/L (ref 0–44)
ANION GAP: 9 (ref 5–15)
AST: 28 U/L (ref 15–41)
Albumin: 2.8 g/dL — ABNORMAL LOW (ref 3.5–5.0)
Alkaline Phosphatase: 153 U/L — ABNORMAL HIGH (ref 38–126)
BILIRUBIN TOTAL: 0.6 mg/dL (ref 0.3–1.2)
BUN: 63 mg/dL — ABNORMAL HIGH (ref 8–23)
CO2: 20 mmol/L — ABNORMAL LOW (ref 22–32)
Calcium: 9.8 mg/dL (ref 8.9–10.3)
Chloride: 103 mmol/L (ref 98–111)
Creatinine, Ser: 2.4 mg/dL — ABNORMAL HIGH (ref 0.44–1.00)
GFR calc Af Amer: 20 mL/min — ABNORMAL LOW (ref >60)
GFR, EST NON AFRICAN AMERICAN: 17 mL/min — AB (ref >60)
Glucose, Bld: 185 mg/dL — ABNORMAL HIGH (ref 70–99)
POTASSIUM: 5.4 mmol/L — AB (ref 3.5–5.1)
Sodium: 132 mmol/L — ABNORMAL LOW (ref 135–145)
TOTAL PROTEIN: 7.7 g/dL (ref 6.5–8.1)

## 2018-10-22 LAB — I-STAT CG4 LACTIC ACID, ED: LACTIC ACID, VENOUS: 1.84 mmol/L (ref 0.5–1.9)

## 2018-10-22 MED ORDER — SODIUM CHLORIDE 0.9 % IV SOLN: 1000.0000 mL | INTRAVENOUS | Status: DC

## 2018-10-22 NOTE — H&P (Signed)
Melissa Mccullough OBS:[id number] DOB: 06/24/1928 DOA: 10/22/2018     PCP: System, Pcp Not In     Patient arrived to ER on 10/22/18 at 2137  Patient coming from: From facility [facility]  Chief Complaint:  Chief Complaint  Patient presents with  . Altered Mental Status  . Hypotension  . Rhonchi    HPI: Melissa Mccullough is a 84 y.o. female with medical history significant of Dementia, DM 2    Presented with  Hypotension and  On good day able to recognize family but not oriented to pa,ce and time, unable to tolerate ambulation due to pain and right leg wound. Family reports SNF stuff could not wake her up. She was able to eat 2 days ago but not since then.  She was at [facility] few weeks ago. She was observed and discharged to [facility] center. She was not able to tolerate PT.  Family denies fever or chills no chest pain only right hip  and foot pain,  Family discussed getting palliative care consult but instead she was discharged to SNF instaed. Family at this point have MOST form and DNR on file.  They do not wish IV fluids IV antibiotics or any resuscitation measures or aggressive interventions.  Family states the patient herself expressed desire to not have any aggressive interventions. Family is interested in palliative care consult and complete comfort care.  Discussed possibility of aspiration with comfort feedings but family understands risk but given at comfort is patient's priority will continue to feed as needed for now     While in ER: In ER patient felt to be will likely have had an episode of aspiration The following Work up has been ordered so far:  Orders Placed This Encounter  Procedures  . Blood Culture (routine x 2)  . DG Chest Port 1 View  . Comprehensive metabolic panel  . CBC WITH DIFFERENTIAL  . Urinalysis, Routine w reflex microscopic  . Diet NPO time specified  . Cardiac monitoring  . Refer to Sidebar Report for: Sepsis Bundle  ED/IP  . Document vital signs within 1-hour of fluid bolus completion and notify provider of bolus completion  . Document Actual / Estimated Weight  . Insert peripheral IV x 2  . Initiate Carrier Fluid Protocol  . pharmacy consult  . Call Code Sepsis ([facility] [phone]) Reason for Consult? tracking  . Consult to hospitalist  . Pulse oximetry, continuous  . I-Stat CG4 Lactic Acid, ED  (not at  [facility])  . EKG 12-Lead  . ED EKG 12-Lead    Following Medications were ordered in ER: Medications  0.9 %  sodium chloride infusion (0 mLs Intravenous Hold 10/22/18 2314)    Significant initial  Findings: Abnormal Labs Reviewed  COMPREHENSIVE METABOLIC PANEL - Abnormal; Notable for the following components:      Result Value   Sodium 132 (*)    Potassium 5.4 (*)    CO2 20 (*)    Glucose, Bld 185 (*)    BUN 63 (*)    Creatinine, Ser 2.40 (*)    Albumin 2.8 (*)    Alkaline Phosphatase 153 (*)    GFR calc non Af Amer 17 (*)    GFR calc Af Amer 20 (*)    All other components within normal limits  CBC WITH DIFFERENTIAL/PLATELET - Abnormal; Notable for the following components:   WBC 14.8 (*)    RBC 3.30 (*)    Hemoglobin 9.8 (*)  HCT 32.8 (*)    MCHC 29.9 (*)    RDW 15.8 (*)    Neutro Abs 9.5 (*)    Monocytes Absolute 2.0 (*)    Abs Immature Granulocytes 0.16 (*)    All other components within normal limits   Na 132 K 5.4  Cr  stable,    Lab Results  Component Value Date   CREATININE 2.40 (H) 10/22/2018   CREATININE 2.87 (H) 02/08/2018   CREATININE 3.90 (H) 02/07/2018    WBC 14.8  HG/HCT  Down  from baseline see below    Component Value Date/Time   HGB 9.8 (L) 10/22/2018 2200   HCT 32.8 (L) 10/22/2018 2200     Troponin (Point of Care Test) No results for input(s): TROPIPOC in the last 72 hours.  BNP (last 3 results) No results for input(s): BNP in the last 8760 hours.  ProBNP (last 3 results) No results for input(s): PROBNP in the last 8760 hours.  Lactic  Acid, Venous    Component Value Date/Time   LATICACIDVEN 1.84 10/22/2018 2208     UA   ordered   CXR -  NON acute     ECG:  Personally reviewed by me showing: HR : 83 Rhythm:  NSR,  no evidence of ischemic changes QTC 410      ED Triage Vitals  Enc Vitals Group     BP 10/22/18 2151 (!) 106/58     Pulse Rate 10/22/18 2151 84     Resp 10/22/18 2151 14     Temp 10/22/18 2151 98.6 F (37 C)     Temp Source 10/22/18 2151 Rectal     SpO2 10/22/18 2140 98 %     Weight 10/22/18 2157 154 lb 12.2 oz (70.2 kg)     Height --      Head Circumference --      Peak Flow --      Pain Score --      Pain Loc --      Pain Edu? --      Excl. in [place]? --   TMAX(24)@       Latest  Blood pressure 116/79, pulse 84, temperature 98.6 F (37 C), temperature source Rectal, resp. rate 12, weight 70.2 kg, SpO2 95 %.    Hospitalist was called for admission for hypotension acute encephalopathy   Review of Systems:    Pertinent positives include: fatigue, weight loss right leg pain confusion  Constitutional:  No weight loss, night sweats, Fevers, chills,  HEENT:  No headaches, Difficulty swallowing,Tooth/dental problems,Sore throat,  No sneezing, itching, ear ache, nasal congestion, post nasal drip,  Cardio-vascular:  No chest pain, Orthopnea, PND, anasarca, dizziness, palpitations.no Bilateral lower extremity swelling  GI:  No heartburn, indigestion, abdominal pain, nausea, vomiting, diarrhea, change in bowel habits, loss of appetite, melena, blood in stool, hematemesis Resp:  no shortness of breath at rest. No dyspnea on exertion, No excess mucus, no productive cough, No non-productive cough, No coughing up of blood.No change in color of mucus.No wheezing. Skin:  no rash or lesions. No jaundice GU:  no dysuria, change in color of urine, no urgency or frequency. No straining to urinate.  No flank pain.  Musculoskeletal:  No joint pain or no joint swelling. No decreased range of  motion. No back pain.  Psych:  No change in mood or affect. No depression or anxiety. No memory loss.  Neuro: no localizing neurological complaints, no tingling, no weakness, no double vision, no gait  abnormality, no slurred speech, no confusion  All systems reviewed and apart from [place] all are negative  Past Medical History:   Past Medical History:  Diagnosis Date  . Anemia   . Glaucoma   . Hyperglycemia 04/2014  . Hyperlipidemia   . Hypertension   . Hypothyroidism   . Neuropathy   . Type II or unspecified type diabetes mellitus without mention of complication, not stated as uncontrolled       Past Surgical History:  Procedure Laterality Date  . BREAST SURGERY    . CHOLECYSTECTOMY    . THYROIDECTOMY      Social History:  Ambulatory  bed bound     reports that she has quit smoking. She has never used smokeless tobacco. She reports that she does not drink alcohol or use drugs.     Family History:   Family History  Problem Relation Age of Onset  . Cancer Other   . Heart attack Other   . Heart disease Mother     Allergies: Allergies  Allergen Reactions  . Codeine Rash  . Macrobid [Nitrofurantoin Macrocrystal] Rash     Prior to Admission medications   Medication Sig Start Date End Date Taking? Authorizing Provider  acetaminophen (TYLENOL) 500 MG tablet Take 500 mg by mouth 4 (four) times daily.   Yes [provider]  amLODipine (NORVASC) 5 MG tablet Take 5 mg by mouth daily.   Yes [provider]  aspirin EC 81 MG tablet Take 81 mg by mouth daily with lunch.   Yes [provider]  brimonidine (ALPHAGAN) 0.2 % ophthalmic solution Place 1 drop into the right eye 3 (three) times daily. 01/15/18  Yes [provider]  cholecalciferol (VITAMIN D3) 25 MCG (1000 UT) tablet Take 1,000 Units by mouth daily.   Yes [provider]  cycloSPORINE (RESTASIS) 0.05 % ophthalmic emulsion Place 1 drop into both eyes every 12 (twelve)  hours.    Yes [provider]  dorzolamide-timolol (COSOPT) 22.3-6.8 MG/ML ophthalmic solution Place 1 drop into both eyes 2 (two) times daily.  03/21/14  Yes [provider]  ezetimibe (ZETIA) 10 MG tablet Take 10 mg by mouth daily.   Yes [provider]  gabapentin (NEURONTIN) 100 MG capsule Take 1 capsule (100 mg total) by mouth at bedtime. Patient taking differently: Take 600 mg by mouth 3 (three) times daily.  02/08/18  Yes Mariel Aloe, MD  hydrochlorothiazide (HYDRODIURIL) 25 MG tablet Take 25 mg by mouth daily.   Yes [provider]  Infant Care Products (DERMACLOUD) CREA Apply 1 application topically 2 (two) times daily.   Yes [provider]  insulin aspart (NOVOLOG PENFILL) cartridge Inject 0-10 Units into the skin 4 (four) times daily -  before meals and at bedtime. 0-150=0 units 151-200=2 units 201-250=4 units 251-300=6 units 301-350=8 units 351-400=10 units and call MD   Yes [provider]  levothyroxine (SYNTHROID, LEVOTHROID) 25 MCG tablet Take 2 tablets (50 mcg total) by mouth daily before breakfast. 02/08/18  Yes Mariel Aloe, MD  lisinopril (PRINIVIL,ZESTRIL) 10 MG tablet Take 10 mg by mouth daily.   Yes [provider]  metFORMIN (GLUCOPHAGE) 500 MG tablet Take 700 mg by mouth 2 (two) times daily with a meal.   Yes [provider]  Multiple Vitamins-Minerals (ONE-A-DAY PROACTIVE 65+) TABS Take 1 tablet by mouth daily.   Yes [provider]  Omega-3 Fatty Acids (FISH OIL PO) Take 1 capsule by mouth daily.  Yes [provider]  pioglitazone (ACTOS) 15 MG tablet Take 15 mg by mouth daily.   Yes [provider]  senna-docusate (SENOKOT-S) 8.6-50 MG tablet Take 1 tablet by mouth 2 (two) times daily.   Yes [provider]  sertraline (ZOLOFT) 25 MG tablet Take 12.5 mg by mouth daily.   Yes [provider]  traMADol (ULTRAM) 50 MG tablet Take 50 mg by mouth every  8 (eight) hours.   Yes [provider]  vitamin C (ASCORBIC ACID) 500 MG tablet Take 1,000 mg by mouth 3 (three) times daily.   Yes [provider]  HYDROcodone-acetaminophen (NORCO/VICODIN) 5-325 MG tablet Take 1 tablet by mouth every 4 (four) hours as needed for moderate pain. Patient not taking: Reported on 10/22/2018 02/08/18   Mariel Aloe, MD  LORazepam (ATIVAN) 0.5 MG tablet Take 0.5 tablets (0.25 mg total) by mouth 2 (two) times daily. EVENING and BEDTIME Patient not taking: Reported on 10/22/2018 02/08/18   Mariel Aloe, MD   Physical Exam: Blood pressure 116/79, pulse 84, temperature 98.6 F (37 C), temperature source Rectal, resp. rate 12, weight 70.2 kg, SpO2 95 %. 1. General:  in No Acute distress   Chronically ill fragile-appearing 2. Psychological: Alert but not Oriented 3. Head/ENT:     Dry Mucous Membranes                          Head Non traumatic, neck supple                           Poor Dentition 4. SKIN: decreased Skin turgor,  Skin clean Dry right heel with a wound with some drainage 5. Heart: Regular rate and rhythm no  Murmur, no Rub or gallop 6. Lungs:   no wheezes or crackles   7. Abdomen: Soft, non-tender, Non distended  Obese bowel sounds present 8. Lower extremities: no clubbing, cyanosis, or  edema 9. Neurologically Grossly intact, moving all 4 extremities equally  10. MSK: Normal range of motion   LABS:     Recent Labs  Lab 10/22/18 2200  WBC 14.8*  NEUTROABS 9.5*  HGB 9.8*  HCT 32.8*  MCV 99.4  PLT 578   Basic Metabolic Panel: Recent Labs  Lab 10/22/18 2200  NA 132*  K 5.4*  CL 103  CO2 20*  GLUCOSE 185*  BUN 63*  CREATININE 2.40*  CALCIUM 9.8      Recent Labs  Lab 10/22/18 2200  AST 28  ALT 27  ALKPHOS 153*  BILITOT 0.6  PROT 7.7  ALBUMIN 2.8*   No results for input(s): LIPASE, AMYLASE in the last 168 hours. No results for input(s): AMMONIA in the last 168 hours.    HbA1C: No results for  input(s): HGBA1C in the last 72 hours. CBG: No results for input(s): GLUCAP in the last 168 hours.    Urine analysis:    Component Value Date/Time   COLORURINE COLORLESS (A) 02/03/2018 1538   APPEARANCEUR CLEAR 02/03/2018 1538   LABSPEC 1.005 02/03/2018 1538   PHURINE 9.0 (H) 02/03/2018 1538   GLUCOSEU 50 (A) 02/03/2018 1538   HGBUR SMALL (A) 02/03/2018 1538   BILIRUBINUR NEGATIVE 02/03/2018 1538   KETONESUR NEGATIVE 02/03/2018 1538   PROTEINUR NEGATIVE 02/03/2018 1538   UROBILINOGEN 0.2 09/21/2014 1821   NITRITE NEGATIVE 02/03/2018 1538   LEUKOCYTESUR NEGATIVE 02/03/2018 1538      Cultures:  Component Value Date/Time   SDES URINE, CATHETERIZED 02/03/2018 1528   SPECREQUEST  02/03/2018 1528    NONE Performed at [facility] [address], [place], [place] [address]    CULT 40,000 COLONIES/mL ENTEROCOCCUS FAECALIS (A) 02/03/2018 1528   REPTSTATUS 02/05/2018 FINAL 02/03/2018 1528     Radiological Exams on Admission: Dg Chest Port 1 View  Result Date: 10/22/2018 CLINICAL DATA:  Shortness of breath. EXAM: PORTABLE CHEST 1 VIEW COMPARISON:  05/08/2016 FINDINGS: The cardiac silhouette, mediastinal and hilar contours are within normal limits and stable given the AP projection and semi upright position. There is tortuosity and calcification of the thoracic aorta. The pulmonary hila appear grossly normal. Low lung volumes with vascular crowding and streaky bibasilar atelectasis. No definite infiltrates or effusions. The bony thorax is intact. Stable surgical changes involving the left neck, likely thyroid surgery. IMPRESSION: Low lung volumes with vascular crowding and streaky bibasilar atelectasis. No infiltrates or effusions. Electronically Signed   By: Marijo Sanes M.D.   On: 10/22/2018 22:23    Chart has been reviewed    Assessment/Plan   84 y.o. female with medical history significant of Dementia, DM 2    Admitted for acute encephalopathy in the setting of  hypotension and sepsis admitted for comfort care only  Present on Admission:  . Acute encephalopathy likely in the setting of dehydration and sepsis concentrate on comfort care only . Sepsis ([place]) she expressed desire to avoid IV fluids or antibiotics or any aggressive interventions patient to be comfort care only . Essential hypertension hold home medications . Hyperlipidemia hold home medications Hypothyroidism hold home medications History of right lower extremity wound patient's family expressed desire to have wound care as this can help her with comfort Other plan as per orders.  DVT prophylaxis: None comfort care  Code Status:    DNR/DNI comfort care as per  family  I had personally discussed CODE STATUS with patient and family I had spent 20 min discussing goals of care and CODE STATUS  Family Communication:   Family  at  Bedside  plan of care was discussed with  Daughters,   Disposition Plan:     likely will need placement for palliative care                                                                       Wound care  consulted                   Palliative care    consulted                                     Consults called:  none  Admission status:   inpatient     Expect 2 midnight stay secondary to severity of patient's current illness including    Severe lab abnormalities including hyper kalemia and extensive comorbidities including:    DM2     dementia   That are currently affecting medical management.  I expect  patient to be hospitalized for 2 midnights requiring inpatient medical care.  Patient is at high risk for adverse outcome (such as loss of  life or disability) if not treated.  Indication for inpatient stay as follows:    severe pain requiring acute inpatient management,  inability to maintain oral hydration          Level of care       medical floor      Toy Baker 10/23/2018, 12:31 AM    [facility]  Pager [phone]    after 2 AM please page floor coverage PA If 7AM-7PM, please contact the day team taking care of the patient  [email or web address]  Password [email or web address]

## 2018-10-22 NOTE — ED Provider Notes (Signed)
[facility] EMERGENCY DEPARTMENT Provider Note   CSN: [id number] Arrival date & time: 10/22/18  2137     History   Chief Complaint Chief Complaint  Patient presents with  . Altered Mental Status  . Hypotension  . Rhonchi    HPI Jacinda B Cleary is a 84 y.o. female.  HPI Patient is an 84 year old female with a past medical history of dementia, hyperglycemia, hyperlipidemia, hypertension, hypothyroidism, and type 2 diabetes who presents to the emergency department from her assisted living facility for evaluation of hypotension and altered mental status.  It was reported that for the past 2 days, patient has been more confused than her baseline.  EMS reports that earlier this afternoon patient was found to be significantly hypotensive and there was concern that she may have aspirated.  As a result patient was sent to the emergency department for further evaluation.  Upon arrival to the emergency department, patient is alert to person only.  She is otherwise in no acute distress.  They did give patient a small bolus of fluids in route to the emergency department secondary to repeat hypotension with improvement to her blood pressure.  Reports starting patient on supplemental oxygen she did have some episodes of desaturation while on the way to the emergency department.  Level 5 caveat applies this patient as she has status and is unable to provide further history.  I was able to speak with her son who states that yesterday she seemed more confused than normal.  States that at baseline she is able to have a somewhat engaged conversation.  Remaining history and review of systems limited secondary to patient's altered mental status.  Past Medical History:  Diagnosis Date  . Anemia   . Glaucoma   . Hyperglycemia 04/2014  . Hyperlipidemia   . Hypertension   . Hypothyroidism   . Neuropathy   . Type II or unspecified type diabetes mellitus without mention of complication, not  stated as uncontrolled     Patient Active Problem List   Diagnosis Date Noted  . Altered mental status   . Palliative care encounter   . Acute encephalopathy 10/22/2018  . Pressure injury of skin 02/02/2018  . Hyperkalemia   . Clostridium difficile infection   . Palliative care by specialist   . Goals of care, counseling/discussion   . Renal failure 01/30/2018  . Sepsis ([place]) 01/30/2018  . Diarrhea 01/30/2018  . Anxiety 08/03/2014  . SOB (shortness of breath) 07/17/2014  . Encephalopathy acute 07/17/2014  . Hypoxia 07/17/2014  . Hyponatremia 07/17/2014  . Acute renal insufficiency 07/17/2014  . Confusion 07/17/2014  . Vitamin D Deficiency 05/17/2014  . UTI (lower urinary tract infection) 05/10/2014  . T2_NIDDM w/ Stage 3 CKD (GFR 68 ml/min)   . Hyperlipidemia   . Neuropathy   . Hypothyroidism   . Anemia   . Glaucoma   . Community acquired pneumonia 05/19/2012  . Essential hypertension 05/19/2012    Past Surgical History:  Procedure Laterality Date  . BREAST SURGERY    . CHOLECYSTECTOMY    . THYROIDECTOMY       OB History   None      Home Medications    Prior to Admission medications   Medication Sig Start Date End Date Taking? Authorizing Provider  acetaminophen (TYLENOL) 500 MG tablet Take 500 mg by mouth 4 (four) times daily.   Yes [provider]  amLODipine (NORVASC) 5 MG tablet Take 5 mg by mouth daily.  Yes [provider]  aspirin EC 81 MG tablet Take 81 mg by mouth daily with lunch.   Yes [provider]  brimonidine (ALPHAGAN) 0.2 % ophthalmic solution Place 1 drop into the right eye 3 (three) times daily. 01/15/18  Yes [provider]  cholecalciferol (VITAMIN D3) 25 MCG (1000 UT) tablet Take 1,000 Units by mouth daily.   Yes [provider]  cycloSPORINE (RESTASIS) 0.05 % ophthalmic emulsion Place 1 drop into both eyes every 12 (twelve) hours.    Yes [provider]  dorzolamide-timolol (COSOPT)  22.3-6.8 MG/ML ophthalmic solution Place 1 drop into both eyes 2 (two) times daily.  03/21/14  Yes [provider]  ezetimibe (ZETIA) 10 MG tablet Take 10 mg by mouth daily.   Yes [provider]  gabapentin (NEURONTIN) 100 MG capsule Take 1 capsule (100 mg total) by mouth at bedtime. Patient taking differently: Take 600 mg by mouth 3 (three) times daily.  02/08/18  Yes Mariel Aloe, MD  hydrochlorothiazide (HYDRODIURIL) 25 MG tablet Take 25 mg by mouth daily.   Yes [provider]  Infant Care Products (DERMACLOUD) CREA Apply 1 application topically 2 (two) times daily.   Yes [provider]  insulin aspart (NOVOLOG PENFILL) cartridge Inject 0-10 Units into the skin 4 (four) times daily -  before meals and at bedtime. 0-150=0 units 151-200=2 units 201-250=4 units 251-300=6 units 301-350=8 units 351-400=10 units and call MD   Yes [provider]  levothyroxine (SYNTHROID, LEVOTHROID) 25 MCG tablet Take 2 tablets (50 mcg total) by mouth daily before breakfast. 02/08/18  Yes Mariel Aloe, MD  lisinopril (PRINIVIL,ZESTRIL) 10 MG tablet Take 10 mg by mouth daily.   Yes [provider]  metFORMIN (GLUCOPHAGE) 500 MG tablet Take 700 mg by mouth 2 (two) times daily with a meal.   Yes [provider]  Multiple Vitamins-Minerals (ONE-A-DAY PROACTIVE 65+) TABS Take 1 tablet by mouth daily.   Yes [provider]  Omega-3 Fatty Acids (FISH OIL PO) Take 1 capsule by mouth daily.   Yes [provider]  pioglitazone (ACTOS) 15 MG tablet Take 15 mg by mouth daily.   Yes [provider]  senna-docusate (SENOKOT-S) 8.6-50 MG tablet Take 1 tablet by mouth 2 (two) times daily.   Yes [provider]  sertraline (ZOLOFT) 25 MG tablet Take 12.5 mg by mouth daily.   Yes [provider]  traMADol (ULTRAM) 50 MG tablet Take 50 mg by mouth every 8 (eight) hours.   Yes [provider]  vitamin C (ASCORBIC  ACID) 500 MG tablet Take 1,000 mg by mouth 3 (three) times daily.   Yes [provider]  HYDROcodone-acetaminophen (NORCO/VICODIN) 5-325 MG tablet Take 1 tablet by mouth every 4 (four) hours as needed for moderate pain. Patient not taking: Reported on 10/22/2018 02/08/18   Mariel Aloe, MD  LORazepam (ATIVAN) 0.5 MG tablet Take 0.5 tablets (0.25 mg total) by mouth 2 (two) times daily. EVENING and BEDTIME Patient not taking: Reported on 10/22/2018 02/08/18   Mariel Aloe, MD    Family History Family History  Problem Relation Age of Onset  . Cancer Other   . Heart attack Other   . Heart disease Mother     Social History Social History   Tobacco Use  . Smoking status: Former Research scientist (life sciences)  . Smokeless tobacco: Never Used  Substance Use Topics  . Alcohol use: No  . Drug use: No     Allergies  Codeine and Macrobid [nitrofurantoin macrocrystal]   Review of Systems Review of Systems  Unable to perform ROS: Mental status change     Physical Exam Updated Vital Signs BP (!) 105/52 (BP Location: Right Arm)   Pulse 83   Temp 98.1 F (36.7 C) (Oral)   Resp 18   Wt 70.2 kg   SpO2 91%   BMI 30.23 kg/m   Physical Exam  Constitutional: No distress.  HENT:  Head: Normocephalic and atraumatic.  Some vomitus present on the right side of patient's chin.  Eyes: Conjunctivae are normal.  Neck: Neck supple.  Cardiovascular: Normal rate and regular rhythm.  Pulmonary/Chest: Effort normal. She has rales (Diffusely.).  Abdominal: Soft.  Patient does not grimace when I press on her abdomen, however she does have intermittent guarding during my exam.  She has multiple scars on her abdomen from previous procedures.  Musculoskeletal:  Patient has what appears to be a pressure related injury on her right heel.  There is a dressing in place, however there is a scant amount of serosanguineous drainage present after moving the dressing.  The fluid is somewhat malodorous.  Mild edema  present in patient's bilateral lower extremity's.  Neurological: She is alert.  Patient able to tell me her name but nothing else.  No obvious facial droop.  Full neurological exam limited secondary to patient's altered mental status.  Skin: Skin is warm and dry.  Psychiatric:  Unable to assess secondary to patient's altered mental status.  Nursing note and vitals reviewed.    ED Treatments / Results  Labs (all labs ordered are listed, but only abnormal results are displayed) Labs Reviewed  COMPREHENSIVE METABOLIC PANEL - Abnormal; Notable for the following components:      Result Value   Sodium 132 (*)    Potassium 5.4 (*)    CO2 20 (*)    Glucose, Bld 185 (*)    BUN 63 (*)    Creatinine, Ser 2.40 (*)    Albumin 2.8 (*)    Alkaline Phosphatase 153 (*)    GFR calc non Af Amer 17 (*)    GFR calc Af Amer 20 (*)    All other components within normal limits  CBC WITH DIFFERENTIAL/PLATELET - Abnormal; Notable for the following components:   WBC 14.8 (*)    RBC 3.30 (*)    Hemoglobin 9.8 (*)    HCT 32.8 (*)    MCHC 29.9 (*)    RDW 15.8 (*)    Neutro Abs 9.5 (*)    Monocytes Absolute 2.0 (*)    Abs Immature Granulocytes 0.16 (*)    All other components within normal limits  CULTURE, BLOOD (ROUTINE X 2)  I-STAT CG4 LACTIC ACID, ED    EKG EKG Interpretation  Date/Time:  Friday October 22 2018 21:45:03 EST Ventricular Rate:  83 PR Interval:    QRS Duration: 90 QT Interval:  349 QTC Calculation: 410 R Axis:   40 Text Interpretation:  Sinus rhythm Confirmed by Lacretia Leigh ([id number]) on 10/22/2018 10:43:57 PM Also confirmed by Lacretia Leigh ([id number]), editor [address] Janett Billow [phone])  on 10/23/2018 12:48:21 PM   Radiology Dg Chest Port 1 View  Result Date: 10/22/2018 CLINICAL DATA:  Shortness of breath. EXAM: PORTABLE CHEST 1 VIEW COMPARISON:  05/08/2016 FINDINGS: The cardiac silhouette, mediastinal and hilar contours are within normal limits and stable given the AP  projection and semi upright position. There is tortuosity and calcification of the thoracic aorta. The pulmonary hila appear grossly  normal. Low lung volumes with vascular crowding and streaky bibasilar atelectasis. No definite infiltrates or effusions. The bony thorax is intact. Stable surgical changes involving the left neck, likely thyroid surgery. IMPRESSION: Low lung volumes with vascular crowding and streaky bibasilar atelectasis. No infiltrates or effusions. Electronically Signed   By: Marijo Sanes M.D.   On: 10/22/2018 22:23    Procedures Procedures (including critical care time)  Medications Ordered in ED Medications  senna-docusate (Senokot-S) tablet 1 tablet (1 tablet Oral Given 10/23/18 1015)  gabapentin (NEURONTIN) capsule 600 mg (600 mg Oral Given 10/23/18 1514)  brimonidine (ALPHAGAN) 0.2 % ophthalmic solution 1 drop (1 drop Right Eye Given 10/23/18 1515)  cycloSPORINE (RESTASIS) 0.05 % ophthalmic emulsion 1 drop (1 drop Both Eyes Given 10/23/18 1021)  dorzolamide-timolol (COSOPT) 22.3-6.8 MG/ML ophthalmic solution 1 drop (1 drop Both Eyes Given 10/23/18 1017)  acetaminophen (TYLENOL) tablet 650 mg (has no administration in time range)    Or  acetaminophen (TYLENOL) suppository 650 mg (has no administration in time range)  haloperidol (HALDOL) tablet 0.5 mg (has no administration in time range)    Or  haloperidol (HALDOL) 2 MG/ML solution 0.5 mg (has no administration in time range)    Or  haloperidol lactate (HALDOL) injection 0.5 mg (has no administration in time range)  ondansetron (ZOFRAN-ODT) disintegrating tablet 4 mg (has no administration in time range)    Or  ondansetron (ZOFRAN) injection 4 mg (has no administration in time range)  glycopyrrolate (ROBINUL) tablet 1 mg (has no administration in time range)    Or  glycopyrrolate (ROBINUL) injection 0.2 mg (has no administration in time range)    Or  glycopyrrolate (ROBINUL) injection 0.2 mg (has no administration in  time range)  antiseptic oral rinse (BIOTENE) solution 15 mL (has no administration in time range)  polyvinyl alcohol (LIQUIFILM TEARS) 1.4 % ophthalmic solution 1 drop (has no administration in time range)  sodium chloride flush (NS) 0.9 % injection 3 mL (3 mLs Intravenous Given 10/23/18 1014)  sodium chloride flush (NS) 0.9 % injection 3 mL (has no administration in time range)  0.9 %  sodium chloride infusion (has no administration in time range)  morphine CONCENTRATE 10 MG/0.5ML oral solution 5 mg (has no administration in time range)    Or  morphine CONCENTRATE 10 MG/0.5ML oral solution 5 mg (has no administration in time range)  morphine 2 MG/ML injection 1 mg (has no administration in time range)  LORazepam (ATIVAN) tablet 1 mg (has no administration in time range)    Or  LORazepam (ATIVAN) 2 MG/ML concentrated solution 1 mg (has no administration in time range)    Or  LORazepam (ATIVAN) injection 1 mg (has no administration in time range)  diphenhydrAMINE (BENADRYL) injection 12.5 mg (has no administration in time range)  bisacodyl (DULCOLAX) suppository 10 mg (has no administration in time range)     Initial Impression / Assessment and Plan / ED Course  I have reviewed the triage vital signs and the nursing notes.  Pertinent labs & imaging results that were available during my care of the patient were reviewed by me and considered in my medical decision making (see chart for details).     Patient is an 84 year old female with a past medical history as detailed above who presents the emergency department for evaluation of altered mental status and hypotension.  Upon arrival to the emergency department patient's blood pressure had improved, however she continued to be alert to person only.  I was able  to speak with her son who confirms that this is not her baseline mental status.  Given patient's initial presentation and the [facility] form that was with the patient, a work-up was  initiated that appeared to be in line with family's wishes as far as continued therapy was concerned.    Upon arrival to the emergency department patient's family made the decision to not pursue further imaging or interventions at this time.  We discussed the fact that I was concerned about possible aspiration, intra-abdominal infection, and infection of patient's right heel wound given her altered mental status, but despite these concerns they still feel that they do not wish to pursue further treatment with antibiotics at this time.  They are amenable to giving the patient intermittent boluses of fluids as a means to keep her comfortable and support her blood pressure, but do not wish to pursue other interventions outside of comfort measures.  The facility that the patient is currently a resident at does not provide comfort care services.  As a result, the hospital service was consulted for admission with a plan to help patient with placement in a facility that will be able to provide comfort care therapy given the wishes of her family.  Patient did not have any further acute events while under my care.  She was evaluated by the hospitalist while in the emergency department.  The hospitalist was able to confirm the family's wishes and the patient will be admitted to the hospital service for further evaluation and care.  For further information regarding patient's continued hospital course please see admitting team documentation.  The care of this patient was discussed with my attending physician Dr. Zenia Resides, who voices agreement with work-up and ED disposition.  Final Clinical Impressions(s) / ED Diagnoses   Final diagnoses:  Altered mental status, unspecified altered mental status type  Hypotension, unspecified hypotension type    ED Discharge Orders    None       Lemond Griffee, Chanda Busing, MD 10/23/18 1641    Lacretia Leigh, MD 10/23/18 2322

## 2018-10-22 NOTE — ED Triage Notes (Signed)
Pt presents with [facility] from [facility] for AMS + hypotenson; SNF staff states noticed change in mental status at 3p and attempted to give water PO for BP, pt now with rhonchi throughout, Spo2 90% room air, BP 80/50; pt given 200cc NS by EMS enroute with 18gLhand;

## 2018-10-22 NOTE — ED Provider Notes (Signed)
I saw and evaluated the patient, reviewed the resident's note and I agree with the findings and plan.  EKG: EKG Interpretation  Date/Time:  Friday October 22 2018 21:45:03 EST Ventricular Rate:  83 PR Interval:    QRS Duration: 90 QT Interval:  349 QTC Calculation: 410 R Axis:   40 Text Interpretation:  Sinus rhythm Confirmed by Lacretia Leigh ([id number]) on 10/22/2018 10:43:58 PM    84 year old with history of dementia presents with altered mental status and hypotension.  Patient is a DNR.  Probable aspiration.  Family request comfort care and will admit to the hospitalist for palliative care   Lacretia Leigh, MD 10/22/18 2245

## 2018-10-23 DIAGNOSIS — I10 Essential (primary) hypertension: Secondary | ICD-10-CM

## 2018-10-23 DIAGNOSIS — R4182 Altered mental status, unspecified: Secondary | ICD-10-CM

## 2018-10-23 DIAGNOSIS — Z515 Encounter for palliative care: Secondary | ICD-10-CM

## 2018-10-23 DIAGNOSIS — G9340 Encephalopathy, unspecified: Secondary | ICD-10-CM

## 2018-10-23 MED ORDER — HALOPERIDOL LACTATE 5 MG/ML IJ SOLN: 0.5000 mg | INTRAMUSCULAR | Status: DC | PRN

## 2018-10-23 MED ORDER — SENNOSIDES-DOCUSATE SODIUM 8.6-50 MG PO TABS
1.0000 | ORAL_TABLET | Freq: Two times a day (BID) | ORAL | Status: DC
  Administered 2018-10-23 – 2018-10-25 (×4): 1 via ORAL
  Filled 2018-10-23 (×5): qty 1

## 2018-10-23 MED ORDER — MORPHINE SULFATE (CONCENTRATE) 10 MG/0.5ML PO SOLN: 5.0000 mg | ORAL | Status: DC | PRN

## 2018-10-23 MED ORDER — LORAZEPAM 1 MG PO TABS: 1.0000 mg | ORAL_TABLET | ORAL | Status: DC | PRN

## 2018-10-23 MED ORDER — BIOTENE DRY MOUTH MT LIQD: 15.0000 mL | OROMUCOSAL | Status: DC | PRN

## 2018-10-23 MED ORDER — DORZOLAMIDE HCL-TIMOLOL MAL 2-0.5 % OP SOLN
1.0000 [drp] | Freq: Two times a day (BID) | OPHTHALMIC | Status: DC
  Administered 2018-10-23 – 2018-10-25 (×5): 1 [drp] via OPHTHALMIC
  Filled 2018-10-23: qty 10

## 2018-10-23 MED ORDER — HALOPERIDOL 0.5 MG PO TABS
0.5000 mg | ORAL_TABLET | ORAL | Status: DC | PRN
  Filled 2018-10-23: qty 1

## 2018-10-23 MED ORDER — CYCLOSPORINE 0.05 % OP EMUL
1.0000 [drp] | Freq: Two times a day (BID) | OPHTHALMIC | Status: DC
  Administered 2018-10-23 – 2018-10-25 (×5): 1 [drp] via OPHTHALMIC
  Filled 2018-10-23 (×6): qty 1

## 2018-10-23 MED ORDER — LORAZEPAM 2 MG/ML IJ SOLN: 1.0000 mg | INTRAMUSCULAR | Status: DC | PRN

## 2018-10-23 MED ORDER — ONDANSETRON 4 MG PO TBDP: 4.0000 mg | ORAL_TABLET | Freq: Four times a day (QID) | ORAL | Status: DC | PRN

## 2018-10-23 MED ORDER — ACETAMINOPHEN 650 MG RE SUPP: 650.0000 mg | Freq: Four times a day (QID) | RECTAL | Status: DC | PRN

## 2018-10-23 MED ORDER — SODIUM CHLORIDE 0.9% FLUSH: 3.0000 mL | INTRAVENOUS | Status: DC | PRN

## 2018-10-23 MED ORDER — DIPHENHYDRAMINE HCL 50 MG/ML IJ SOLN: 12.5000 mg | INTRAMUSCULAR | Status: DC | PRN

## 2018-10-23 MED ORDER — HALOPERIDOL LACTATE 2 MG/ML PO CONC
0.5000 mg | ORAL | Status: DC | PRN
  Filled 2018-10-23: qty 0.3

## 2018-10-23 MED ORDER — SERTRALINE HCL 25 MG PO TABS
12.5000 mg | ORAL_TABLET | Freq: Every day | ORAL | Status: DC
  Administered 2018-10-23: 12.5 mg via ORAL
  Filled 2018-10-23: qty 0.5

## 2018-10-23 MED ORDER — MORPHINE SULFATE (PF) 2 MG/ML IV SOLN
1.0000 mg | INTRAVENOUS | Status: DC | PRN
  Administered 2018-10-24 – 2018-10-25 (×5): 1 mg via INTRAVENOUS
  Filled 2018-10-23 (×5): qty 1

## 2018-10-23 MED ORDER — SODIUM CHLORIDE 0.9% FLUSH
3.0000 mL | Freq: Two times a day (BID) | INTRAVENOUS | Status: DC
  Administered 2018-10-23 – 2018-10-25 (×6): 3 mL via INTRAVENOUS

## 2018-10-23 MED ORDER — ACETAMINOPHEN 325 MG PO TABS
650.0000 mg | ORAL_TABLET | Freq: Four times a day (QID) | ORAL | Status: DC | PRN
  Administered 2018-10-23 – 2018-10-24 (×2): 650 mg via ORAL
  Filled 2018-10-23 (×2): qty 2

## 2018-10-23 MED ORDER — BISACODYL 10 MG RE SUPP: 10.0000 mg | Freq: Every day | RECTAL | Status: DC | PRN

## 2018-10-23 MED ORDER — GABAPENTIN 300 MG PO CAPS
600.0000 mg | ORAL_CAPSULE | Freq: Three times a day (TID) | ORAL | Status: DC
  Administered 2018-10-23 – 2018-10-25 (×5): 600 mg via ORAL
  Filled 2018-10-23 (×7): qty 2

## 2018-10-23 MED ORDER — LORAZEPAM 2 MG/ML PO CONC: 1.0000 mg | ORAL | Status: DC | PRN

## 2018-10-23 MED ORDER — GLYCOPYRROLATE 1 MG PO TABS
1.0000 mg | ORAL_TABLET | ORAL | Status: DC | PRN
  Filled 2018-10-23: qty 1

## 2018-10-23 MED ORDER — POLYVINYL ALCOHOL 1.4 % OP SOLN
1.0000 [drp] | Freq: Four times a day (QID) | OPHTHALMIC | Status: DC | PRN
  Filled 2018-10-23: qty 15

## 2018-10-23 MED ORDER — TRAMADOL HCL 50 MG PO TABS
50.0000 mg | ORAL_TABLET | Freq: Three times a day (TID) | ORAL | Status: DC
  Administered 2018-10-23: 50 mg via ORAL
  Filled 2018-10-23: qty 1

## 2018-10-23 MED ORDER — SODIUM CHLORIDE 0.9 % IV SOLN: 250.0000 mL | INTRAVENOUS | Status: DC | PRN

## 2018-10-23 MED ORDER — ONDANSETRON HCL 4 MG/2ML IJ SOLN: 4.0000 mg | Freq: Four times a day (QID) | INTRAMUSCULAR | Status: DC | PRN

## 2018-10-23 MED ORDER — GLYCOPYRROLATE 0.2 MG/ML IJ SOLN: 0.2000 mg | INTRAMUSCULAR | Status: DC | PRN

## 2018-10-23 MED ORDER — BRIMONIDINE TARTRATE 0.2 % OP SOLN
1.0000 [drp] | Freq: Three times a day (TID) | OPHTHALMIC | Status: DC
  Administered 2018-10-23 – 2018-10-25 (×7): 1 [drp] via OPHTHALMIC
  Filled 2018-10-23: qty 5

## 2018-10-23 MED ORDER — TEMAZEPAM 7.5 MG PO CAPS: 7.5000 mg | ORAL_CAPSULE | Freq: Every evening | ORAL | Status: DC | PRN

## 2018-10-23 NOTE — ED Notes (Signed)
R heel wound noted with drainage and sloughing skin; heel floated with pillow

## 2018-10-23 NOTE — ED Notes (Signed)
Per nurse pt is now comfort care,  No addl labs draw.

## 2018-10-23 NOTE — Consult Note (Signed)
Consultation Note Date: 10/23/2018   Patient Name: Melissa Mccullough  DOB: 19-Oct-1928  MRN: [id number]  Age / Sex: 84 y.o., female  PCP: System, Pcp Not In Referring Physician: Roxan Hockey, MD  Reason for Consultation: Establishing goals of care  HPI/Patient Profile: 84 y.o. female  with past medical history of dementia, hypertension, hyperlipidemia, hypothyroidism, diabetes, neuropathy, who was recently hospitalized several weeks ago at [facility] after a fall.  She was discharged to rehab at [facility] care center where she reportedly has declined and was unable to work with therapy.  She is now readmitted at [facility] on 10/22/2018 with altered mental status and hypotension.  Initial work-up revealed acute renal failure with serum creatinine of 2.40 and hyperkalemia 5.4.  She is presumed to have an infection although source is unclear.  Family opted to admit for comfort care.  Palliative care was consulted to help establish goals.  Clinical Assessment and Goals of Care: Patient comfortable appearing this morning.  She wakes easily with stimulation and is pleasantly confused.  Oriented only to person.  She is asking about breakfast.  I spoke with patient's son by phone and then in person.  He confirms plan for comfort measures only.  Family are not interested in work-up or treatment that is not geared for her comfort.  Apparently, patient has been telling family for quite some time that she is ready to die and go to heaven to see her husband and son.  Prior to her recent hospitalization at [facility], patient was living in an independent living facility.  Patient had been declining over the past few months with increasing weakness.  Family had hired caregivers in the home.  However, patient had become essentially chair bound and had several falls.  Patient has 3 children, a son and 2 daughters.   Patient's son says he is a Special educational needs teacher of attorney.  While at [facility] care center patient was unable to work with rehab.  Family were exploring the option of transitioning her to long-term care even if it meant paying out-of-pocket.  They had applied for Medicaid.  Son verbalizes an understanding the patient is likely approaching end-of-life.  Again, he reiterates family's desire to keep her comfortable.  We discussed the option of transferring her back to long-term care with hospice versus a hospice facility.  Will consult social work to help with discharge planning.  SUMMARY OF RECOMMENDATIONS   1.  Continue comfort care 2.  Social work consult to help with discharge planning 3.  Recommend transfer back to long-term care with hospice 4.  Alternatively, can consider discharge to residential hospice if patient further declines     Primary Diagnoses: Present on Admission: . Essential hypertension . Hyperlipidemia . Acute encephalopathy . Sepsis ([place])   I have reviewed the medical record, interviewed the patient and family, and examined the patient. The following aspects are pertinent.  Past Medical History:  Diagnosis Date  . Anemia   . Glaucoma   . Hyperglycemia 04/2014  . Hyperlipidemia   .  Hypertension   . Hypothyroidism   . Neuropathy   . Type II or unspecified type diabetes mellitus without mention of complication, not stated as uncontrolled    Social History   Socioeconomic History  . Marital status: Widowed    Spouse name: Not on file  . Number of children: Not on file  . Years of education: Not on file  . Highest education level: Not on file  Occupational History  . Not on file  Social Needs  . Financial resource strain: Not on file  . Food insecurity:    Worry: Not on file    Inability: Not on file  . Transportation needs:    Medical: Not on file    Non-medical: Not on file  Tobacco Use  . Smoking status: Former Research scientist (life sciences)  . Smokeless tobacco:  Never Used  Substance and Sexual Activity  . Alcohol use: No  . Drug use: No  . Sexual activity: Not on file  Lifestyle  . Physical activity:    Days per week: Not on file    Minutes per session: Not on file  . Stress: Not on file  Relationships  . Social connections:    Talks on phone: Not on file    Gets together: Not on file    Attends religious service: Not on file    Active member of club or organization: Not on file    Attends meetings of clubs or organizations: Not on file    Relationship status: Not on file  Other Topics Concern  . Not on file  Social History Narrative  . Not on file   Family History  Problem Relation Age of Onset  . Cancer Other   . Heart attack Other   . Heart disease Mother    Scheduled Meds: . brimonidine  1 drop Right Eye TID  . cycloSPORINE  1 drop Both Eyes Q12H  . dorzolamide-timolol  1 drop Both Eyes BID  . gabapentin  600 mg Oral TID  . senna-docusate  1 tablet Oral BID  . sertraline  12.5 mg Oral Daily  . sodium chloride flush  3 mL Intravenous Q12H  . traMADol  50 mg Oral Q8H   Continuous Infusions: . sodium chloride     PRN Meds:.sodium chloride, acetaminophen **OR** acetaminophen, antiseptic oral rinse, bisacodyl, diphenhydrAMINE, glycopyrrolate **OR** glycopyrrolate **OR** glycopyrrolate, haloperidol **OR** haloperidol **OR** haloperidol lactate, LORazepam **OR** LORazepam **OR** LORazepam, morphine injection, morphine CONCENTRATE **OR** morphine CONCENTRATE, ondansetron **OR** ondansetron (ZOFRAN) IV, polyvinyl alcohol, sodium chloride flush, temazepam Medications Prior to Admission:  Prior to Admission medications   Medication Sig Start Date End Date Taking? Authorizing Provider  acetaminophen (TYLENOL) 500 MG tablet Take 500 mg by mouth 4 (four) times daily.   Yes [provider]  amLODipine (NORVASC) 5 MG tablet Take 5 mg by mouth daily.   Yes [provider]  aspirin EC 81 MG tablet Take 81 mg by mouth  daily with lunch.   Yes [provider]  brimonidine (ALPHAGAN) 0.2 % ophthalmic solution Place 1 drop into the right eye 3 (three) times daily. 01/15/18  Yes [provider]  cholecalciferol (VITAMIN D3) 25 MCG (1000 UT) tablet Take 1,000 Units by mouth daily.   Yes [provider]  cycloSPORINE (RESTASIS) 0.05 % ophthalmic emulsion Place 1 drop into both eyes every 12 (twelve) hours.    Yes [provider]  dorzolamide-timolol (COSOPT) 22.3-6.8 MG/ML ophthalmic solution Place 1 drop into both eyes 2 (two) times daily.  03/21/14  Yes [provider]  ezetimibe (ZETIA) 10 MG tablet Take 10 mg by mouth daily.   Yes [provider]  gabapentin (NEURONTIN) 100 MG capsule Take 1 capsule (100 mg total) by mouth at bedtime. Patient taking differently: Take 600 mg by mouth 3 (three) times daily.  02/08/18  Yes Mariel Aloe, MD  hydrochlorothiazide (HYDRODIURIL) 25 MG tablet Take 25 mg by mouth daily.   Yes [provider]  Infant Care Products (DERMACLOUD) CREA Apply 1 application topically 2 (two) times daily.   Yes [provider]  insulin aspart (NOVOLOG PENFILL) cartridge Inject 0-10 Units into the skin 4 (four) times daily -  before meals and at bedtime. 0-150=0 units 151-200=2 units 201-250=4 units 251-300=6 units 301-350=8 units 351-400=10 units and call MD   Yes [provider]  levothyroxine (SYNTHROID, LEVOTHROID) 25 MCG tablet Take 2 tablets (50 mcg total) by mouth daily before breakfast. 02/08/18  Yes Mariel Aloe, MD  lisinopril (PRINIVIL,ZESTRIL) 10 MG tablet Take 10 mg by mouth daily.   Yes [provider]  metFORMIN (GLUCOPHAGE) 500 MG tablet Take 700 mg by mouth 2 (two) times daily with a meal.   Yes [provider]  Multiple Vitamins-Minerals (ONE-A-DAY PROACTIVE 65+) TABS Take 1 tablet by mouth daily.   Yes [provider]  Omega-3 Fatty Acids (FISH OIL PO) Take 1 capsule by  mouth daily.   Yes [provider]  pioglitazone (ACTOS) 15 MG tablet Take 15 mg by mouth daily.   Yes [provider]  senna-docusate (SENOKOT-S) 8.6-50 MG tablet Take 1 tablet by mouth 2 (two) times daily.   Yes [provider]  sertraline (ZOLOFT) 25 MG tablet Take 12.5 mg by mouth daily.   Yes [provider]  traMADol (ULTRAM) 50 MG tablet Take 50 mg by mouth every 8 (eight) hours.   Yes [provider]  vitamin C (ASCORBIC ACID) 500 MG tablet Take 1,000 mg by mouth 3 (three) times daily.   Yes [provider]  HYDROcodone-acetaminophen (NORCO/VICODIN) 5-325 MG tablet Take 1 tablet by mouth every 4 (four) hours as needed for moderate pain. Patient not taking: Reported on 10/22/2018 02/08/18   Mariel Aloe, MD  LORazepam (ATIVAN) 0.5 MG tablet Take 0.5 tablets (0.25 mg total) by mouth 2 (two) times daily. EVENING and BEDTIME Patient not taking: Reported on 10/22/2018 02/08/18   Mariel Aloe, MD   Allergies  Allergen Reactions  . Codeine Rash  . Macrobid [Nitrofurantoin Macrocrystal] Rash   Review of Systems  Unable to perform ROS   Physical Exam  Vital Signs: BP (!) 105/52 (BP Location: Right Arm)   Pulse 83   Temp 98.1 F (36.7 C) (Oral)   Resp 18   Wt 70.2 kg   SpO2 91%   BMI 30.23 kg/m  Pain Scale: PAINAD       SpO2: SpO2: 91 % O2 Device:SpO2: 91 % O2 Flow Rate: .   IO: Intake/output summary:   Intake/Output Summary (Last 24 hours) at 10/23/2018 0836 Last data filed at 10/22/2018 2140 Gross per 24 hour  Intake 200 ml  Output 0 ml  Net 200 ml    LBM:   Baseline Weight: Weight: 70.2 kg Most recent weight: Weight: 70.2 kg     Palliative Assessment/Data:     Time In: 0800 Time Out: 0850 Time Total: 50 minutes Greater than 50%  of this time was spent counseling and coordinating care related to the above assessment  and plan.  Signed by: Irean Hong, NP   Please contact Palliative Medicine  Team phone at [phone] for questions and concerns.  For individual provider: See Shea Evans

## 2018-10-23 NOTE — Progress Notes (Signed)
Patient Demographics:    Melissa Mccullough, is a 84 y.o. female, DOB - [birth year], DTO:[id number]  Admit date - 10/22/2018   Admitting Physician Toy Baker, MD  Outpatient Primary MD for the patient is System, Pcp Not In  LOS - 1   Chief Complaint  Patient presents with  . Altered Mental Status  . Hypotension  . Rhonchi        Subjective:    Melissa Mccullough today has no fevers, no emesis,  No chest pain, son and daughter at bedside, patient resting comfortably  Assessment  & Plan :    Active Problems:   Essential hypertension   Hyperlipidemia   Sepsis ([place])   Acute encephalopathy   Altered mental status   Palliative care encounter   Brief summary 84 y.o. female  with past medical history of dementia, hypertension, hyperlipidemia, hypothyroidism, diabetes, neuropathy, who was recently hospitalized several weeks ago at [facility] after a fall.  She was discharged to rehab at [facility] care center where she reportedly has declined and was unable to work with therapy.  She is now readmitted at [facility] on 10/22/2018 with altered mental status and hypotension.  Initial work-up revealed acute renal failure with serum creatinine of 2.40 and hyperkalemia 5.4.  She is presumed to have an infection although source is unclear.  Family opted to admit for comfort care, palliative care consult appreciated  Plan:-  1)Dysphagia--- presumed recurrent aspiration, at this time family would like to continue with pleasure feeding/comfort care  2) acute metabolic encephalopathy----suspect due to dehydration, cannot rule out concomitant sepsis, family declines aggressive treatment specifically declines antibiotics, declines IV fluids patient remained comfort care only  3)Rt LE wound--- family agreeable to wound care for right heel wound  4) hypothyroidism/hypertension/hyperlipidemia----family declines  further management of these chronic conditions, comfort care only  5)Social/Ethics----family requested comfort care only, patient is a DNR/DNI, patient has limitations to treatment, see MOST form, continue as needed Haldol, PRN lorazepam, PRN morphine sulfate , use Robinul to help with excessive secretions  Disposition/Need for in-Hospital Stay- patient unable to be discharged at this time due to awaiting insurance approval for possible transfer to SNF with palliative care involvement or residential hospice  Code Status : DNR   Disposition Plan  : Needs possible transfer to SNF with palliative care involvement or residential hospice  Consults  : Palliative care/social work   DVT Prophylaxis  :   SCDs  Lab Results  Component Value Date   PLT 286 10/22/2018    Inpatient Medications  Scheduled Meds: . brimonidine  1 drop Right Eye TID  . cycloSPORINE  1 drop Both Eyes Q12H  . dorzolamide-timolol  1 drop Both Eyes BID  . gabapentin  600 mg Oral TID  . senna-docusate  1 tablet Oral BID  . sertraline  12.5 mg Oral Daily  . sodium chloride flush  3 mL Intravenous Q12H  . traMADol  50 mg Oral Q8H   Continuous Infusions: . sodium chloride     PRN Meds:.sodium chloride, acetaminophen **OR** acetaminophen, antiseptic oral rinse, bisacodyl, diphenhydrAMINE, glycopyrrolate **OR** glycopyrrolate **OR** glycopyrrolate, haloperidol **OR** haloperidol **OR** haloperidol lactate, LORazepam **OR** LORazepam **OR** LORazepam, morphine injection, morphine CONCENTRATE **OR** morphine CONCENTRATE, ondansetron **OR** ondansetron (ZOFRAN) IV, polyvinyl alcohol,  sodium chloride flush, temazepam    Anti-infectives (From admission, onward)   None        Objective:   Vitals:   10/22/18 2345 10/23/18 0000 10/23/18 0015 10/23/18 0057  BP: (!) 103/44 (!) 106/49 (!) 91/47 (!) 105/52  Pulse: 84 86 84 83  Resp: 10 15 (!) 9 18  Temp:    98.1 F (36.7 C)  TempSrc:    Oral  SpO2: 91% 93% 90% 91%    Weight:        Wt Readings from Last 3 Encounters:  10/22/18 70.2 kg  02/08/18 68.1 kg  09/21/14 68 kg     Intake/Output Summary (Last 24 hours) at 10/23/2018 1223 Last data filed at 10/23/2018 0900 Gross per 24 hour  Intake 200 ml  Output 0 ml  Net 200 ml     Physical Exam Patient is examined daily including today on 10/23/18 , exams remain the same as of yesterday except that has changed   Gen:- Awake , oriented to self only HEENT:- [place].AT, No sclera icterus Neck-Supple Neck,No JVD,.  Lungs-diminished in bases, no wheezing CV- S1, S2 normal, regular Abd-  +ve B.Sounds, Abd Soft, No tenderness,    Extremity/Skin:- No  edema,   right  heel wound with scant drainage  psych-affect is flat, oriented x 1, sleeps on and off  neuro-generalized weakness without  new focal deficits, no tremors   Data Review:   Micro Results No results found for this or any previous visit (from the past 240 hour(s)).  Radiology Reports Dg Chest Port 1 View  Result Date: 10/22/2018 CLINICAL DATA:  Shortness of breath. EXAM: PORTABLE CHEST 1 VIEW COMPARISON:  05/08/2016 FINDINGS: The cardiac silhouette, mediastinal and hilar contours are within normal limits and stable given the AP projection and semi upright position. There is tortuosity and calcification of the thoracic aorta. The pulmonary hila appear grossly normal. Low lung volumes with vascular crowding and streaky bibasilar atelectasis. No definite infiltrates or effusions. The bony thorax is intact. Stable surgical changes involving the left neck, likely thyroid surgery. IMPRESSION: Low lung volumes with vascular crowding and streaky bibasilar atelectasis. No infiltrates or effusions. Electronically Signed   By: Marijo Sanes M.D.   On: 10/22/2018 22:23     CBC Recent Labs  Lab 10/22/18 2200  WBC 14.8*  HGB 9.8*  HCT 32.8*  PLT 286  MCV 99.4  MCH 29.7  MCHC 29.9*  RDW 15.8*  LYMPHSABS 2.9  MONOABS 2.0*  EOSABS 0.2  BASOSABS 0.1     Chemistries  Recent Labs  Lab 10/22/18 2200  NA 132*  K 5.4*  CL 103  CO2 20*  GLUCOSE 185*  BUN 63*  CREATININE 2.40*  CALCIUM 9.8  AST 28  ALT 27  ALKPHOS 153*  BILITOT 0.6   ------------------------------------------------------------------------------------------------------------------ No results for input(s): CHOL, HDL, LDLCALC, TRIG, CHOLHDL, LDLDIRECT in the last 72 hours.  Lab Results  Component Value Date   HGBA1C 7.4 (H) 05/11/2014   ------------------------------------------------------------------------------------------------------------------ No results for input(s): TSH, T4TOTAL, T3FREE, THYROIDAB in the last 72 hours.  Invalid input(s): FREET3 ------------------------------------------------------------------------------------------------------------------ No results for input(s): VITAMINB12, FOLATE, FERRITIN, TIBC, IRON, RETICCTPCT in the last 72 hours.  Coagulation profile No results for input(s): INR, PROTIME in the last 168 hours.  No results for input(s): DDIMER in the last 72 hours.  Cardiac Enzymes No results for input(s): CKMB, TROPONINI, MYOGLOBIN in the last 168 hours.  Invalid input(s): CK ------------------------------------------------------------------------------------------------------------------ No results found for: BNP   Melissa Mccullough  Margee Trentham M.D on 10/23/2018 at 12:23 PM  Pager---[phone] Go to [email or web address] - password [email or web address] for contact info  [facility] - Office  [phone]

## 2018-10-23 NOTE — Progress Notes (Addendum)
Received pt alert but oriented only to person. Pt denies pain or short of breath at this time. Family at bedside. Pt settled in bed. Wound to right heel noted, cleansed and redressed. Will monitor pt.

## 2018-10-24 NOTE — Progress Notes (Signed)
Patient Demographics:    Melissa Mccullough, is a 84 y.o. female, DOB - 05-07-1928, WNI:[id number]  Admit date - 10/22/2018   Admitting Physician Toy Baker, MD  Outpatient Primary MD for the patient is System, Pcp Not In  LOS - 2   Chief Complaint  Patient presents with  . Altered Mental Status  . Hypotension  . Rhonchi        Subjective:    Melissa Mccullough today has no fevers, no emesis,  No chest pain,  daughter at bedside, patient resting comfortably, questions answered  Assessment  & Plan :    Active Problems:   Essential hypertension   Hyperlipidemia   Sepsis ([place])   Acute encephalopathy   Altered mental status   Palliative care encounter   Brief summary 84 y.o. female  with past medical history of dementia, hypertension, hyperlipidemia, hypothyroidism, diabetes, neuropathy, who was recently hospitalized several weeks ago at [facility] after a fall.  She was discharged to rehab at [facility] care center where she reportedly has declined and was unable to work with therapy.  She is now readmitted at [facility] on 10/22/2018 with altered mental status and hypotension.  Initial work-up revealed acute renal failure with serum creatinine of 2.40 and hyperkalemia 5.4.  She is presumed to have an infection although source is unclear.  Family opted to admit for comfort care, palliative care consult appreciated. family prefers transfer to residential hospice in [facility] pending insurance approval and bed availability   Plan:-  1)Dysphagia--- presumed recurrent aspiration, at this time family would like to continue with pleasure feeding/comfort care  2) acute metabolic encephalopathy----suspect due to dehydration, cannot rule out concomitant sepsis, family declines aggressive treatment specifically declines antibiotics, declines IV fluids patient remained comfort care only  3)Rt LE wound---  family agreeable to wound care for right heel wound  4) hypothyroidism/hypertension/hyperlipidemia----family declines further management of these chronic conditions, comfort care only  5)Social/Ethics----family requested comfort care only, patient is a DNR/DNI, patient has limitations to treatment, see MOST form, continue as needed Haldol, PRN lorazepam, PRN morphine sulfate , use Robinul to help with excessive secretions, family prefers transfer to residential hospice in [place] pending insurance approval and bed availability  Disposition/Need for in-Hospital Stay- patient unable to be discharged at this time due to awaiting insurance approval for possible transfer to SNF with palliative care involvement or residential hospice  Code Status : DNR   Disposition Plan  : Awaiting insurance approval for transfer to SNF with palliative care involvement or residential hospice, family prefers residential hospice in [place]  : Palliative care/social work   DVT Prophylaxis  :   SCDs  Lab Results  Component Value Date   PLT 286 10/22/2018    Inpatient Medications  Scheduled Meds: . brimonidine  1 drop Right Eye TID  . cycloSPORINE  1 drop Both Eyes Q12H  . dorzolamide-timolol  1 drop Both Eyes BID  . gabapentin  600 mg Oral TID  . senna-docusate  1 tablet Oral BID  . sodium chloride flush  3 mL Intravenous Q12H   Continuous Infusions: . sodium chloride     PRN Meds:.sodium chloride, acetaminophen **OR** acetaminophen, antiseptic oral rinse, bisacodyl, diphenhydrAMINE, glycopyrrolate **OR** glycopyrrolate **OR** glycopyrrolate,  haloperidol **OR** haloperidol **OR** haloperidol lactate, LORazepam **OR** LORazepam **OR** LORazepam, morphine injection, morphine CONCENTRATE **OR** morphine CONCENTRATE, ondansetron **OR** ondansetron (ZOFRAN) IV, polyvinyl alcohol, sodium chloride flush    Anti-infectives (From admission, onward)   None        Objective:   Vitals:    10/23/18 0000 10/23/18 0015 10/23/18 0057 10/24/18 0619  BP: (!) 106/49 (!) 91/47 (!) 105/52 (!) 162/76  Pulse: 86 84 83 86  Resp: 15 (!) 9 18   Temp:   98.1 F (36.7 C) 98.1 F (36.7 C)  TempSrc:   Oral Oral  SpO2: 93% 90% 91% 99%  Weight:        Wt Readings from Last 3 Encounters:  10/22/18 70.2 kg  02/08/18 68.1 kg  09/21/14 68 kg     Intake/Output Summary (Last 24 hours) at 10/24/2018 1711 Last data filed at 10/24/2018 1130 Gross per 24 hour  Intake 170 ml  Output 450 ml  Net -280 ml     Physical Exam Patient is examined daily including today on 10/24/18 , exams remain the same as of yesterday except that has changed   Gen:- Awake , oriented to self only HEENT:- [place].AT, No sclera icterus Neck-Supple Neck,No JVD,.  Lungs-diminished in bases, no wheezing CV- S1, S2 normal, regular Abd-  +ve B.Sounds, Abd Soft, No tenderness,    Extremity/Skin:- No  edema,   right  heel wound with scant drainage  psych-affect is flat, oriented x 1, sleeps on and off  neuro-generalized weakness without  new focal deficits, no tremors  Overall appears comfortable, not in any acute distress   Data Review:   Micro Results Recent Results (from the past 240 hour(s))  Blood Culture (routine x 2)     Status: None (Preliminary result)   Collection Time: 10/22/18 10:00 PM  Result Value Ref Range Status   Specimen Description BLOOD RIGHT HAND  Final   Special Requests   Final    BOTTLES DRAWN AEROBIC AND ANAEROBIC Blood Culture adequate volume   Culture   Final    NO GROWTH 2 DAYS Performed at [facility], [address] [place], [place] [address]    Report Status PENDING  Incomplete    Radiology Reports Dg Chest Port 1 View  Result Date: 10/22/2018 CLINICAL DATA:  Shortness of breath. EXAM: PORTABLE CHEST 1 VIEW COMPARISON:  05/08/2016 FINDINGS: The cardiac silhouette, mediastinal and hilar contours are within normal limits and stable given the AP projection and semi  upright position. There is tortuosity and calcification of the thoracic aorta. The pulmonary hila appear grossly normal. Low lung volumes with vascular crowding and streaky bibasilar atelectasis. No definite infiltrates or effusions. The bony thorax is intact. Stable surgical changes involving the left neck, likely thyroid surgery. IMPRESSION: Low lung volumes with vascular crowding and streaky bibasilar atelectasis. No infiltrates or effusions. Electronically Signed   By: Marijo Sanes M.D.   On: 10/22/2018 22:23     CBC Recent Labs  Lab 10/22/18 2200  WBC 14.8*  HGB 9.8*  HCT 32.8*  PLT 286  MCV 99.4  MCH 29.7  MCHC 29.9*  RDW 15.8*  LYMPHSABS 2.9  MONOABS 2.0*  EOSABS 0.2  BASOSABS 0.1    Chemistries  Recent Labs  Lab 10/22/18 2200  NA 132*  K 5.4*  CL 103  CO2 20*  GLUCOSE 185*  BUN 63*  CREATININE 2.40*  CALCIUM 9.8  AST 28  ALT 27  ALKPHOS 153*  BILITOT 0.6   ------------------------------------------------------------------------------------------------------------------  No results for input(s): CHOL, HDL, LDLCALC, TRIG, CHOLHDL, LDLDIRECT in the last 72 hours.  Lab Results  Component Value Date   HGBA1C 7.4 (H) 05/11/2014   ------------------------------------------------------------------------------------------------------------------ No results for input(s): TSH, T4TOTAL, T3FREE, THYROIDAB in the last 72 hours.  Invalid input(s): FREET3 ------------------------------------------------------------------------------------------------------------------ No results for input(s): VITAMINB12, FOLATE, FERRITIN, TIBC, IRON, RETICCTPCT in the last 72 hours.  Coagulation profile No results for input(s): INR, PROTIME in the last 168 hours.  No results for input(s): DDIMER in the last 72 hours.  Cardiac Enzymes No results for input(s): CKMB, TROPONINI, MYOGLOBIN in the last 168 hours.  Invalid input(s):  CK ------------------------------------------------------------------------------------------------------------------ No results found for: BNP   Roxan Hockey M.D on 10/24/2018 at 5:11 PM  Pager---[phone] Go to [email or web address] - password [email or web address] for contact info  [facility] - Office  [phone]

## 2018-10-24 NOTE — NC FL2 (Signed)
[place] MEDICAID FL2 LEVEL OF CARE SCREENING TOOL     IDENTIFICATION  Patient Name: Melissa Mccullough Birthdate: 1928-07-05 Sex: female Admission Date (Current Location): 10/22/2018  [facility] and [place] Number:  Herbalist and Address:  [facility], [place] [address], [place], [place] [address]      Provider Number: [id number]  Attending Physician Name and Address:  Roxan Hockey, MD  Relative Name and Phone Number:       Current Level of Care: Hospital Recommended Level of Care: [place] Prior Approval Number:    Date Approved/Denied:   PASRR Number:    Discharge Plan: SNF    Current Diagnoses: Patient Active Problem List   Diagnosis Date Noted  . Altered mental status   . Palliative care encounter   . Acute encephalopathy 10/22/2018  . Pressure injury of skin 02/02/2018  . Hyperkalemia   . Clostridium difficile infection   . Palliative care by specialist   . Goals of care, counseling/discussion   . Renal failure 01/30/2018  . Sepsis ([place]) 01/30/2018  . Diarrhea 01/30/2018  . Anxiety 08/03/2014  . SOB (shortness of breath) 07/17/2014  . Encephalopathy acute 07/17/2014  . Hypoxia 07/17/2014  . Hyponatremia 07/17/2014  . Acute renal insufficiency 07/17/2014  . Confusion 07/17/2014  . Vitamin D Deficiency 05/17/2014  . UTI (lower urinary tract infection) 05/10/2014  . T2_NIDDM w/ Stage 3 CKD (GFR 68 ml/min)   . Hyperlipidemia   . Neuropathy   . Hypothyroidism   . Anemia   . Glaucoma   . Community acquired pneumonia 05/19/2012  . Essential hypertension 05/19/2012    Orientation RESPIRATION BLADDER Height & Weight     Self  Normal External catheter, Incontinent(placed 10/23/18) Weight: 154 lb 12.2 oz (70.2 kg) Height:     BEHAVIORAL SYMPTOMS/MOOD NEUROLOGICAL BOWEL NUTRITION STATUS      Continent Diet(NPO at this time, diet subject to change, please check d/c summary)  AMBULATORY STATUS COMMUNICATION  OF NEEDS Skin   Extensive Assist Verbally PU Stage and Appropriate Care PU Stage 1 Dressing: (Heel, guaze dressing, change PRN)                     Personal Care Assistance Level of Assistance  Bathing, Feeding, Dressing Bathing Assistance: Maximum assistance Feeding assistance: Maximum assistance Dressing Assistance: Maximum assistance     Functional Limitations Info  Sight, Hearing, Speech Sight Info: Adequate Hearing Info: Adequate Speech Info: Adequate    SPECIAL CARE FACTORS FREQUENCY  PT (By licensed PT), OT (By licensed OT)                    Contractures Contractures Info: Not present    Additional Factors Info  Code Status, Allergies Code Status Info: DNR Allergies Info: Codeine, Macrobid Nitrofurantoin Macrocrystal           Current Medications (10/24/2018):  This is the current hospital active medication list Current Facility-Administered Medications  Medication Dose Route Frequency Provider Last Rate Last Dose  . 0.9 %  sodium chloride infusion  250 mL Intravenous PRN Doutova, Anastassia, MD      . acetaminophen (TYLENOL) tablet 650 mg  650 mg Oral Q6H PRN Toy Baker, MD   650 mg at 10/23/18 2137   Or  . acetaminophen (TYLENOL) suppository 650 mg  650 mg Rectal Q6H PRN Doutova, Anastassia, MD      . antiseptic oral rinse (BIOTENE) solution 15 mL  15 mL Topical  PRN Toy Baker, MD      . bisacodyl (DULCOLAX) suppository 10 mg  10 mg Rectal Daily PRN Doutova, Anastassia, MD      . brimonidine (ALPHAGAN) 0.2 % ophthalmic solution 1 drop  1 drop Right Eye TID Toy Baker, MD   1 drop at 10/23/18 2132  . cycloSPORINE (RESTASIS) 0.05 % ophthalmic emulsion 1 drop  1 drop Both Eyes Q12H Doutova, Anastassia, MD   1 drop at 10/23/18 2132  . diphenhydrAMINE (BENADRYL) injection 12.5 mg  12.5 mg Intravenous Q4H PRN Doutova, Anastassia, MD      . dorzolamide-timolol (COSOPT) 22.3-6.8 MG/ML ophthalmic solution 1 drop  1 drop Both Eyes BID  Toy Baker, MD   1 drop at 10/23/18 2132  . gabapentin (NEURONTIN) capsule 600 mg  600 mg Oral TID Toy Baker, MD   600 mg at 10/23/18 2132  . glycopyrrolate (ROBINUL) tablet 1 mg  1 mg Oral Q4H PRN Doutova, Anastassia, MD       Or  . glycopyrrolate (ROBINUL) injection 0.2 mg  0.2 mg Subcutaneous Q4H PRN Doutova, Anastassia, MD       Or  . glycopyrrolate (ROBINUL) injection 0.2 mg  0.2 mg Intravenous Q4H PRN Doutova, Anastassia, MD      . haloperidol (HALDOL) tablet 0.5 mg  0.5 mg Oral Q4H PRN Doutova, Anastassia, MD       Or  . haloperidol (HALDOL) 2 MG/ML solution 0.5 mg  0.5 mg Sublingual Q4H PRN Doutova, Anastassia, MD       Or  . haloperidol lactate (HALDOL) injection 0.5 mg  0.5 mg Intravenous Q4H PRN Doutova, Anastassia, MD      . LORazepam (ATIVAN) tablet 1 mg  1 mg Oral Q4H PRN Toy Baker, MD       Or  . LORazepam (ATIVAN) 2 MG/ML concentrated solution 1 mg  1 mg Sublingual Q4H PRN Toy Baker, MD       Or  . LORazepam (ATIVAN) injection 1 mg  1 mg Intravenous Q4H PRN Doutova, Anastassia, MD      . morphine 2 MG/ML injection 1 mg  1 mg Intravenous Q2H PRN Doutova, Anastassia, MD      . morphine CONCENTRATE 10 MG/0.5ML oral solution 5 mg  5 mg Oral Q2H PRN Doutova, Anastassia, MD       Or  . morphine CONCENTRATE 10 MG/0.5ML oral solution 5 mg  5 mg Sublingual Q2H PRN Doutova, Anastassia, MD      . ondansetron (ZOFRAN-ODT) disintegrating tablet 4 mg  4 mg Oral Q6H PRN Doutova, Anastassia, MD       Or  . ondansetron (ZOFRAN) injection 4 mg  4 mg Intravenous Q6H PRN Doutova, Anastassia, MD      . polyvinyl alcohol (LIQUIFILM TEARS) 1.4 % ophthalmic solution 1 drop  1 drop Both Eyes QID PRN Doutova, Anastassia, MD      . senna-docusate (Senokot-S) tablet 1 tablet  1 tablet Oral BID Toy Baker, MD   1 tablet at 10/23/18 2132  . sodium chloride flush (NS) 0.9 % injection 3 mL  3 mL Intravenous Q12H Toy Baker, MD   3 mL at 10/23/18 2133   . sodium chloride flush (NS) 0.9 % injection 3 mL  3 mL Intravenous PRN Toy Baker, MD         Discharge Medications: Please see discharge summary for a list of discharge medications.  Relevant Imaging Results:  Relevant Lab Results:   Additional Information SS#: [id number]  Eileen Stanford, LCSW

## 2018-10-25 DIAGNOSIS — Z515 Encounter for palliative care: Secondary | ICD-10-CM

## 2018-10-25 MED ORDER — ACETAMINOPHEN 650 MG RE SUPP: 650.0000 mg | Freq: Four times a day (QID) | RECTAL | 0 refills | Status: AC | PRN

## 2018-10-25 NOTE — Progress Notes (Signed)
Nutrition Brief Note  Chart reviewed. Pt now transitioning to comfort care.  No further nutrition interventions warranted at this time.  Please re-consult as needed.   Anureet Bruington A. Eliabeth Shoff, RD, LDN, CDE Pager: [phone] After hours Pager: [phone]  

## 2018-10-25 NOTE — Consult Note (Signed)
[facility]:  Melissa Mccullough made referral for pt to go to residential hospice care. Met with the daughter Melissa Mccullough Discussed the pt's care and the philosophy of hospice care. Pt is not eating and is confused. She appears to be comfortable at this time with IV morphine use for pain/SOB.  Spoke to our Market researcher and she was approved for transfer to the [facility]. Pt daughter signed consents for services and [facility] has been called at 155pm to pick pt up at 300-330pm.  Crawford Givens SW made aware and will notify MD. She states pt is ready for discharge if bed available. Webb Silversmith RN [phone]

## 2018-10-25 NOTE — Discharge Instructions (Signed)
1) transfer to residential hospice 2) full comfort care services

## 2018-10-25 NOTE — Discharge Summary (Signed)
Melissa Mccullough, is a 84 y.o. female  DOB [birth year]  MRN [id number].  Admission date:  10/22/2018  Admitting Physician  Toy Baker, MD  Discharge Date:  10/25/2018   Primary MD  System, Pcp Not In  Recommendations for primary care physician for things to follow:   1) transfer to residential hospice 2) full comfort care services  Admission Diagnosis  Hypotension, unspecified hypotension type [I95.9] Altered mental status, unspecified altered mental status type [R41.82]   Discharge Diagnosis  Hypotension, unspecified hypotension type [I95.9] Altered mental status, unspecified altered mental status type [R41.82]    Active Problems:   Essential hypertension   Hyperlipidemia   Sepsis ([place])   Acute encephalopathy   Altered mental status   Palliative care encounter      Past Medical History:  Diagnosis Date  . Anemia   . Glaucoma   . Hyperglycemia 04/2014  . Hyperlipidemia   . Hypertension   . Hypothyroidism   . Neuropathy   . Type II or unspecified type diabetes mellitus without mention of complication, not stated as uncontrolled     Past Surgical History:  Procedure Laterality Date  . BREAST SURGERY    . CHOLECYSTECTOMY    . THYROIDECTOMY       HPI  from the history and physical done on the day of admission:    HPI: Melissa Mccullough is a 84 y.o. female with medical history significant of Dementia, DM 2   Presented with  Hypotension and  On good day able to recognize family but not oriented to pa,ce and time, unable to tolerate ambulation due to pain and right leg wound. Family reports SNF stuff could not wake her up. She was able to eat 2 days ago but not since then.  She was at [facility] few weeks ago. She was observed and discharged to [facility] center. She was not able to tolerate PT.  Family denies fever or chills no chest pain only right hip  and foot pain,    Family discussed getting palliative care consult but instead she was discharged to SNF instaed. Family at this point have MOST form and DNR on file.  They do not wish IV fluids IV antibiotics or any resuscitation measures or aggressive interventions.  Family states the patient herself expressed desire to not have any aggressive interventions. Family is interested in palliative care consult and complete comfort care.  Discussed possibility of aspiration with comfort feedings but family understands risk but given at comfort is patient's priority will continue to feed as needed for now     While in ER: In ER patient felt to be will likely have had an episode of aspiration The following Work up has been ordered so far:     Hospital Course:     Brief summary 84 y.o.femalewith past medical history of dementia, hypertension, hyperlipidemia, hypothyroidism, diabetes, neuropathy, who was recently hospitalized several weeks ago at [facility] after a fall. She was discharged to rehab at [facility] care center where she  reportedly has declined and was unable to work with therapy. She is now readmitted at [facility] 11/8/2019with altered mental status and hypotension.Initial work-up revealed acute renal failure with serum creatinine of 2.40 and hyperkalemia 5.4. She is presumed to have an infection although source is unclear. Family opted to admit for comfort care, palliative care consult appreciated.  transfer to residential hospice in [facility]:-  1)Dysphagia--- presumed recurrent aspiration, at this time family would like to continue with pleasure feeding/comfort care  2) acute metabolic encephalopathy----suspect due to dehydration, cannot rule out concomitant sepsis, family declines aggressive treatment specifically declines antibiotics, declines IV fluids, patient remained comfort care only  3)Rt LE wound--- family agreeable to wound care for right heel wound  4)  hypothyroidism/hypertension/hyperlipidemia----family declines further management of these chronic conditions, comfort care only  5)Social/Ethics----family requested comfort care only, patient is a DNR/DNI, patient has limitations to treatment, see MOST form, continue as needed Haldol, PRN lorazepam, PRN morphine sulfate , use Robinul to help with excessive secretions, transfer to residential hospice in [facility]   Code Status : DNR    Consults  : Palliative care/social work   Discharge Condition: [place] prognosis  Consults obtained - Hospice/palliative care  Diet and Activity recommendation:  As advised  Discharge Instructions    Discharge Instructions    Bed rest   Complete by:  As directed    Call MD for:   Complete by:  As directed    1) transfer to residential hospice 2) full comfort care services   Diet general   Complete by:  As directed    Discharge instructions   Complete by:  As directed    1) transfer to residential hospice 2) full comfort care services        Discharge Medications     Allergies as of 10/25/2018      Reactions   Codeine Rash   Macrobid [nitrofurantoin Macrocrystal] Rash      Medication List    STOP taking these medications   amLODipine 5 MG tablet Commonly known as:  NORVASC   aspirin EC 81 MG tablet   cholecalciferol 25 MCG (1000 UT) tablet Commonly known as:  VITAMIN D3   DERMACLOUD Crea   ezetimibe 10 MG tablet Commonly known as:  ZETIA   FISH OIL PO   gabapentin 100 MG capsule Commonly known as:  NEURONTIN   hydrochlorothiazide 25 MG tablet Commonly known as:  HYDRODIURIL   HYDROcodone-acetaminophen 5-325 MG tablet Commonly known as:  NORCO/VICODIN   levothyroxine 25 MCG tablet Commonly known as:  SYNTHROID, LEVOTHROID   lisinopril 10 MG tablet Commonly known as:  PRINIVIL,ZESTRIL   LORazepam 0.5 MG tablet Commonly known as:  ATIVAN   metFORMIN 500 MG tablet Commonly known as:  GLUCOPHAGE   NOVOLOG  PENFILL cartridge Generic drug:  insulin aspart   ONE-A-DAY PROACTIVE 65+ Tabs   pioglitazone 15 MG tablet Commonly known as:  ACTOS   sertraline 25 MG tablet Commonly known as:  ZOLOFT   traMADol 50 MG tablet Commonly known as:  ULTRAM   vitamin C 500 MG tablet Commonly known as:  ASCORBIC ACID     TAKE these medications   acetaminophen 500 MG tablet Commonly known as:  TYLENOL Take 500 mg by mouth 4 (four) times daily. What changed:  Another medication with the same name was added. Make sure you understand how and when to take each.   acetaminophen 650 MG suppository Commonly known as:  TYLENOL Place 1 suppository (  650 mg total) rectally every 6 (six) hours as needed for mild pain (or Fever >/= 101). What changed:  You were already taking a medication with the same name, and this prescription was added. Make sure you understand how and when to take each.   brimonidine 0.2 % ophthalmic solution Commonly known as:  ALPHAGAN Place 1 drop into the right eye 3 (three) times daily.   cycloSPORINE 0.05 % ophthalmic emulsion Commonly known as:  RESTASIS Place 1 drop into both eyes every 12 (twelve) hours.   dorzolamide-timolol 22.3-6.8 MG/ML ophthalmic solution Commonly known as:  COSOPT Place 1 drop into both eyes 2 (two) times daily.   senna-docusate 8.6-50 MG tablet Commonly known as:  Senokot-S Take 1 tablet by mouth 2 (two) times daily.       Major procedures and Radiology Reports - PLEASE review detailed and final reports for all details, in brief -   Dg Chest Port 1 View  Result Date: 10/22/2018 CLINICAL DATA:  Shortness of breath. EXAM: PORTABLE CHEST 1 VIEW COMPARISON:  05/08/2016 FINDINGS: The cardiac silhouette, mediastinal and hilar contours are within normal limits and stable given the AP projection and semi upright position. There is tortuosity and calcification of the thoracic aorta. The pulmonary hila appear grossly normal. Low lung volumes with vascular  crowding and streaky bibasilar atelectasis. No definite infiltrates or effusions. The bony thorax is intact. Stable surgical changes involving the left neck, likely thyroid surgery. IMPRESSION: Low lung volumes with vascular crowding and streaky bibasilar atelectasis. No infiltrates or effusions. Electronically Signed   By: Marijo Sanes M.D.   On: 10/22/2018 22:23    Micro Results   Recent Results (from the past 240 hour(s))  Blood Culture (routine x 2)     Status: None (Preliminary result)   Collection Time: 10/22/18 10:00 PM  Result Value Ref Range Status   Specimen Description BLOOD RIGHT HAND  Final   Special Requests   Final    BOTTLES DRAWN AEROBIC AND ANAEROBIC Blood Culture adequate volume   Culture   Final    NO GROWTH 3 DAYS Performed at [facility], [place] [address], [place], [place] [address]    Report Status PENDING  Incomplete    Today   Subjective    Blondina Viglione today has no new complaints, resting comfortably, family at bedside          Patient has been seen and examined prior to discharge to residential hospice   Objective   Blood pressure (!) 161/83, pulse 81, temperature 97.6 F (36.4 C), temperature source Oral, resp. rate 18, weight 70.2 kg, SpO2 98 %.   Intake/Output Summary (Last 24 hours) at 10/25/2018 1357 Last data filed at 10/25/2018 0300 Gross per 24 hour  Intake 25 ml  Output -  Net 25 ml    Exam Patient is examined daily including today on 10/25/18 , exams remain the same as of yesterday except that has changed   Gen:- Awake , oriented to self only HEENT:- [place].AT, No sclera icterus Neck-Supple Neck,No JVD,.  Lungs-diminished in bases, no wheezing CV- S1, S2 normal, regular Abd-  +ve B.Sounds, Abd Soft, No tenderness,    Extremity/Skin:- No  edema,   right  heel wound with scant drainage  psych-affect is flat, oriented x 1, sleeps on and off  neuro-generalized weakness without  new focal deficits, no tremors  Overall  appears comfortable, not in any acute distress   Data Review   CBC w Diff:  Lab Results  Component Value Date   WBC 14.8 (H) 10/22/2018   HGB 9.8 (L) 10/22/2018   HCT 32.8 (L) 10/22/2018   PLT 286 10/22/2018   LYMPHOPCT 20 10/22/2018   MONOPCT 14 10/22/2018   EOSPCT 1 10/22/2018   BASOPCT 0 10/22/2018    CMP:  Lab Results  Component Value Date   NA 132 (L) 10/22/2018   K 5.4 (H) 10/22/2018   CL 103 10/22/2018   CO2 20 (L) 10/22/2018   BUN 63 (H) 10/22/2018   CREATININE 2.40 (H) 10/22/2018   CREATININE 0.67 06/09/2014   PROT 7.7 10/22/2018   ALBUMIN 2.8 (L) 10/22/2018   BILITOT 0.6 10/22/2018   ALKPHOS 153 (H) 10/22/2018   AST 28 10/22/2018   ALT 27 10/22/2018  .   Total Discharge time is about 33 minutes  Roxan Hockey M.D on 10/25/2018 at 1:57 PM  Pager---[phone]  Go to [email or web address] - password [email or web address] for contact info  [facility] - Office  [phone]

## 2018-10-25 NOTE — Progress Notes (Signed)
Pt discharged to [place] this pm. Condition stable for discharge.

## 2018-10-25 NOTE — Clinical Social Work Note (Signed)
LATE ENTRY: CSW received consult regarding Hospice placement for patient. Daughter [name] contacted and facility preference was [facility]. Cherie with Hospice contacted and they were able to accept patient. Ms. Thursby transported to [place] via ambulance.  Dayron Odland Givens, MSW, LCSW Licensed Clinical Social Worker [place] [phone]

## 2018-10-25 NOTE — Consult Note (Signed)
[place] Nurse wound consult note Patient evaluated at [id number]. No family present. Reason for Consult: Right heel wound Wound type: Stage 2 PI Pressure Injury POA: Yes Measurement: 5cm x 5cm Wound bed: Macerated white in color, consistent with ruptured bulla Drainage (amount, consistency, odor) Serosanguinous on existing xeroform and gauze dressing.  Periwound: Macerated white with erythema in the achilles area extending medially. Patient with heel floated by two pillows off of air mattress Dressing procedure/placement/frequency: Apply Xeroform gauze ([place]# [id number]) to right posterior heel and cover with 4 x 4 and secure with kerlex. Change daily. Monitor the wound area(s) for worsening of condition such as: Signs/symptoms of infection,  Increase in size,  Development of or worsening of odor, Development of pain, or increased pain at the affected locations.  Notify the medical team if any of these develop.  Thank you for the consult.  Discussed plan of care with the patient and bedside nurse.  [place] nurse will not follow at this time.  Please re-consult the [place] team if needed.  Val Riles, RN, MSN, CWOCN, CNS-BC, pager [phone]

## 2018-10-27 LAB — CULTURE, BLOOD (ROUTINE X 2)
CULTURE: NO GROWTH
Special Requests: ADEQUATE

## 2018-11-14 DEATH — deceased
